# Patient Record
Sex: Female | Born: 1960
Health system: Southern US, Community
[De-identification: ages and names within clinical notes are randomized; demographics above are authoritative.]

## PROBLEM LIST (undated history)

## (undated) DIAGNOSIS — M329 Systemic lupus erythematosus, unspecified: Secondary | ICD-10-CM

## (undated) DIAGNOSIS — E785 Hyperlipidemia, unspecified: Secondary | ICD-10-CM

## (undated) DIAGNOSIS — G4730 Sleep apnea, unspecified: Secondary | ICD-10-CM

## (undated) DIAGNOSIS — L940 Localized scleroderma [morphea]: Secondary | ICD-10-CM

## (undated) DIAGNOSIS — G43909 Migraine, unspecified, not intractable, without status migrainosus: Secondary | ICD-10-CM

## (undated) DIAGNOSIS — M7710 Lateral epicondylitis, unspecified elbow: Secondary | ICD-10-CM

## (undated) DIAGNOSIS — R32 Unspecified urinary incontinence: Secondary | ICD-10-CM

## (undated) DIAGNOSIS — L930 Discoid lupus erythematosus: Secondary | ICD-10-CM

## (undated) DIAGNOSIS — F419 Anxiety disorder, unspecified: Secondary | ICD-10-CM

## (undated) DIAGNOSIS — L564 Polymorphous light eruption: Secondary | ICD-10-CM

## (undated) DIAGNOSIS — M797 Fibromyalgia: Secondary | ICD-10-CM

## (undated) DIAGNOSIS — F329 Major depressive disorder, single episode, unspecified: Secondary | ICD-10-CM

## (undated) DIAGNOSIS — M7711 Lateral epicondylitis, right elbow: Secondary | ICD-10-CM

## (undated) DIAGNOSIS — K8020 Calculus of gallbladder without cholecystitis without obstruction: Secondary | ICD-10-CM

## (undated) DIAGNOSIS — M722 Plantar fascial fibromatosis: Secondary | ICD-10-CM

## (undated) DIAGNOSIS — D219 Benign neoplasm of connective and other soft tissue, unspecified: Secondary | ICD-10-CM

## (undated) DIAGNOSIS — K2950 Unspecified chronic gastritis without bleeding: Secondary | ICD-10-CM

## (undated) DIAGNOSIS — E039 Hypothyroidism, unspecified: Secondary | ICD-10-CM

## (undated) DIAGNOSIS — M1990 Unspecified osteoarthritis, unspecified site: Secondary | ICD-10-CM

## (undated) DIAGNOSIS — F32A Depression, unspecified: Secondary | ICD-10-CM

## (undated) DIAGNOSIS — R7303 Prediabetes: Secondary | ICD-10-CM

## (undated) DIAGNOSIS — L309 Dermatitis, unspecified: Secondary | ICD-10-CM

## (undated) DIAGNOSIS — Z87442 Personal history of urinary calculi: Secondary | ICD-10-CM

## (undated) DIAGNOSIS — E063 Autoimmune thyroiditis: Secondary | ICD-10-CM

## (undated) DIAGNOSIS — N200 Calculus of kidney: Secondary | ICD-10-CM

## (undated) HISTORY — DX: Dermatitis, unspecified: L30.9

## (undated) HISTORY — DX: Benign neoplasm of connective and other soft tissue, unspecified: D21.9

## (undated) HISTORY — PX: ROTATOR CUFF REPAIR: SHX139

## (undated) HISTORY — PX: GALLBLADDER SURGERY: SHX652

## (undated) HISTORY — PX: APPENDECTOMY: SHX54

## (undated) HISTORY — DX: Depression, unspecified: F32.A

## (undated) HISTORY — PX: HIATAL HERNIA REPAIR: SHX195

## (undated) HISTORY — PX: ABDOMINAL HYSTERECTOMY: SHX81

## (undated) HISTORY — DX: Unspecified chronic gastritis without bleeding: K29.50

## (undated) HISTORY — DX: Hyperlipidemia, unspecified: E78.5

## (undated) HISTORY — DX: Hypothyroidism, unspecified: E03.9

## (undated) HISTORY — DX: Discoid lupus erythematosus: L93.0

## (undated) HISTORY — DX: Plantar fascial fibromatosis: M72.2

## (undated) HISTORY — DX: Lateral epicondylitis, unspecified elbow: M77.10

## (undated) HISTORY — PX: ESOPHAGOGASTRODUODENOSCOPY: SHX1529

## (undated) HISTORY — DX: Polymorphous light eruption: L56.4

## (undated) HISTORY — DX: Unspecified osteoarthritis, unspecified site: M19.90

## (undated) HISTORY — DX: Unspecified urinary incontinence: R32

## (undated) HISTORY — PX: OTHER SURGICAL HISTORY: SHX169

## (undated) HISTORY — DX: Major depressive disorder, single episode, unspecified: F32.9

## (undated) HISTORY — DX: Localized scleroderma (morphea): L94.0

## (undated) HISTORY — DX: Calculus of kidney: N20.0

## (undated) HISTORY — DX: Sleep apnea, unspecified: G47.30

## (undated) HISTORY — PX: LAPAROSCOPIC GASTRIC SLEEVE RESECTION: SHX5895

## (undated) HISTORY — DX: Calculus of gallbladder without cholecystitis without obstruction: K80.20

## (undated) HISTORY — DX: Fibromyalgia: M79.7

## (undated) HISTORY — DX: Autoimmune thyroiditis: E06.3

## (undated) HISTORY — DX: Systemic lupus erythematosus, unspecified: M32.9

## (undated) HISTORY — DX: Anxiety disorder, unspecified: F41.9

## (undated) HISTORY — DX: Prediabetes: R73.03

---

## 2002-02-03 HISTORY — PX: LAPAROSCOPIC TOTAL HYSTERECTOMY: SUR800

## 2014-07-05 LAB — HM COLONOSCOPY

## 2015-08-20 DIAGNOSIS — L930 Discoid lupus erythematosus: Secondary | ICD-10-CM | POA: Diagnosis not present

## 2015-08-20 DIAGNOSIS — L940 Localized scleroderma [morphea]: Secondary | ICD-10-CM | POA: Diagnosis not present

## 2015-08-27 DIAGNOSIS — M7541 Impingement syndrome of right shoulder: Secondary | ICD-10-CM | POA: Diagnosis not present

## 2015-08-27 DIAGNOSIS — M25511 Pain in right shoulder: Secondary | ICD-10-CM | POA: Diagnosis not present

## 2015-08-27 DIAGNOSIS — G8929 Other chronic pain: Secondary | ICD-10-CM | POA: Diagnosis not present

## 2015-08-27 DIAGNOSIS — M7531 Calcific tendinitis of right shoulder: Secondary | ICD-10-CM | POA: Diagnosis not present

## 2015-09-04 DIAGNOSIS — R21 Rash and other nonspecific skin eruption: Secondary | ICD-10-CM | POA: Diagnosis not present

## 2015-09-04 DIAGNOSIS — Z79899 Other long term (current) drug therapy: Secondary | ICD-10-CM | POA: Diagnosis not present

## 2015-09-04 DIAGNOSIS — L930 Discoid lupus erythematosus: Secondary | ICD-10-CM | POA: Diagnosis not present

## 2015-09-05 DIAGNOSIS — M25511 Pain in right shoulder: Secondary | ICD-10-CM | POA: Diagnosis not present

## 2015-09-28 DIAGNOSIS — M6281 Muscle weakness (generalized): Secondary | ICD-10-CM | POA: Diagnosis not present

## 2015-10-12 DIAGNOSIS — Z5189 Encounter for other specified aftercare: Secondary | ICD-10-CM | POA: Diagnosis not present

## 2015-10-12 DIAGNOSIS — M7521 Bicipital tendinitis, right shoulder: Secondary | ICD-10-CM | POA: Diagnosis not present

## 2015-10-23 DIAGNOSIS — M7521 Bicipital tendinitis, right shoulder: Secondary | ICD-10-CM | POA: Diagnosis not present

## 2015-11-01 DIAGNOSIS — M6281 Muscle weakness (generalized): Secondary | ICD-10-CM | POA: Diagnosis not present

## 2015-11-02 DIAGNOSIS — M25511 Pain in right shoulder: Secondary | ICD-10-CM | POA: Diagnosis not present

## 2015-11-07 DIAGNOSIS — E039 Hypothyroidism, unspecified: Secondary | ICD-10-CM | POA: Diagnosis not present

## 2015-11-07 DIAGNOSIS — E785 Hyperlipidemia, unspecified: Secondary | ICD-10-CM | POA: Diagnosis not present

## 2015-11-22 DIAGNOSIS — Z23 Encounter for immunization: Secondary | ICD-10-CM | POA: Diagnosis not present

## 2015-11-26 DIAGNOSIS — L930 Discoid lupus erythematosus: Secondary | ICD-10-CM | POA: Diagnosis not present

## 2015-11-26 DIAGNOSIS — L940 Localized scleroderma [morphea]: Secondary | ICD-10-CM | POA: Diagnosis not present

## 2015-11-26 DIAGNOSIS — L2084 Intrinsic (allergic) eczema: Secondary | ICD-10-CM | POA: Diagnosis not present

## 2015-11-26 DIAGNOSIS — L309 Dermatitis, unspecified: Secondary | ICD-10-CM | POA: Diagnosis not present

## 2015-12-16 ENCOUNTER — Emergency Department
Admission: EM | Admit: 2015-12-16 | Discharge: 2015-12-17 | Disposition: A | Discharge status: Home / Self Care | Payer: BLUE CROSS/BLUE SHIELD | Attending: Emergency Medicine | Admitting: Emergency Medicine

## 2015-12-16 ENCOUNTER — Emergency Department: Payer: BLUE CROSS/BLUE SHIELD

## 2015-12-16 DIAGNOSIS — Z87891 Personal history of nicotine dependence: Secondary | ICD-10-CM | POA: Insufficient documentation

## 2015-12-16 DIAGNOSIS — Z79899 Other long term (current) drug therapy: Secondary | ICD-10-CM | POA: Insufficient documentation

## 2015-12-16 DIAGNOSIS — N201 Calculus of ureter: Secondary | ICD-10-CM | POA: Diagnosis not present

## 2015-12-16 DIAGNOSIS — N200 Calculus of kidney: Secondary | ICD-10-CM

## 2015-12-16 DIAGNOSIS — N132 Hydronephrosis with renal and ureteral calculous obstruction: Secondary | ICD-10-CM | POA: Diagnosis not present

## 2015-12-16 DIAGNOSIS — R109 Unspecified abdominal pain: Secondary | ICD-10-CM | POA: Diagnosis present

## 2015-12-16 DIAGNOSIS — E039 Hypothyroidism, unspecified: Secondary | ICD-10-CM | POA: Diagnosis not present

## 2015-12-16 LAB — URINALYSIS COMPLETE WITH MICROSCOPIC (ARMC ONLY)
Bilirubin Urine: NEGATIVE
Glucose, UA: NEGATIVE mg/dL
Ketones, ur: NEGATIVE mg/dL
Leukocytes, UA: NEGATIVE
Nitrite: NEGATIVE
Protein, ur: 100 mg/dL — AB
Specific Gravity, Urine: 1.028 (ref 1.005–1.030)
pH: 5 (ref 5.0–8.0)

## 2015-12-16 LAB — COMPREHENSIVE METABOLIC PANEL
ALT: 22 U/L (ref 14–54)
AST: 23 U/L (ref 15–41)
Albumin: 4 g/dL (ref 3.5–5.0)
Alkaline Phosphatase: 96 U/L (ref 38–126)
Anion gap: 7 (ref 5–15)
BUN: 22 mg/dL — ABNORMAL HIGH (ref 6–20)
CO2: 27 mmol/L (ref 22–32)
Calcium: 9 mg/dL (ref 8.9–10.3)
Chloride: 105 mmol/L (ref 101–111)
Creatinine, Ser: 1.05 mg/dL — ABNORMAL HIGH (ref 0.44–1.00)
GFR calc Af Amer: >60 mL/min (ref >60)
GFR calc non Af Amer: 59 mL/min — ABNORMAL LOW (ref >60)
Glucose, Bld: 144 mg/dL — ABNORMAL HIGH (ref 65–99)
Potassium: 3.9 mmol/L (ref 3.5–5.1)
Sodium: 139 mmol/L (ref 135–145)
Total Bilirubin: 0.7 mg/dL (ref 0.3–1.2)
Total Protein: 8 g/dL (ref 6.5–8.1)

## 2015-12-16 LAB — CBC
HCT: 44.2 % (ref 35.0–47.0)
Hemoglobin: 14.5 g/dL (ref 12.0–16.0)
MCH: 29.2 pg (ref 26.0–34.0)
MCHC: 32.8 g/dL (ref 32.0–36.0)
MCV: 88.9 fL (ref 80.0–100.0)
Platelets: 246 10*3/uL (ref 150–440)
RBC: 4.98 MIL/uL (ref 3.80–5.20)
RDW: 14.2 % (ref 11.5–14.5)
WBC: 15.7 10*3/uL — ABNORMAL HIGH (ref 3.6–11.0)

## 2015-12-16 MED ORDER — ONDANSETRON HCL 4 MG/2ML IJ SOLN
INTRAMUSCULAR | Status: AC
  Administered 2015-12-16: 4 mg via INTRAVENOUS
  Filled 2015-12-16: qty 2

## 2015-12-16 MED ORDER — ONDANSETRON HCL 4 MG/2ML IJ SOLN
4.0000 mg | Freq: Once | INTRAMUSCULAR | Status: AC
  Administered 2015-12-16: 4 mg via INTRAVENOUS

## 2015-12-16 MED ORDER — SODIUM CHLORIDE 0.9 % IV BOLUS (SEPSIS)
1000.0000 mL | Freq: Once | INTRAVENOUS | Status: AC
  Administered 2015-12-16: 1000 mL via INTRAVENOUS

## 2015-12-16 MED ORDER — MORPHINE SULFATE (PF) 4 MG/ML IV SOLN
4.0000 mg | Freq: Once | INTRAVENOUS | Status: AC
  Administered 2015-12-16: 4 mg via INTRAVENOUS
  Filled 2015-12-16: qty 1

## 2015-12-16 MED ORDER — KETOROLAC TROMETHAMINE 30 MG/ML IJ SOLN
30.0000 mg | Freq: Once | INTRAMUSCULAR | Status: AC
  Administered 2015-12-16: 30 mg via INTRAVENOUS
  Filled 2015-12-16: qty 1

## 2015-12-16 MED ORDER — TAMSULOSIN HCL 0.4 MG PO CAPS
0.4000 mg | ORAL_CAPSULE | Freq: Once | ORAL | Status: AC
  Administered 2015-12-16: 0.4 mg via ORAL
  Filled 2015-12-16: qty 1

## 2015-12-16 NOTE — ED Triage Notes (Signed)
Pt reports suddely while driving home from [facility] onset of left flank pain around 3 pm today while driving home from [place].Pt reports pain goes straight through to her back.

## 2015-12-16 NOTE — ED Provider Notes (Signed)
[facility] Emergency Department Provider Note  ____________________________________________  Time seen: Approximately 8:33 PM  I have reviewed the triage vital signs and the nursing notes.   HISTORY  Chief Complaint Flank Pain   HPI Helen Freeman is a 55 y.o. female with h/o Lupus who presents for evaluation of the left flank pain. Patient reports that she was driving home from [place] when she developed sudden onset of left flank pain. The pain is 8 out of 10, sharp, located in her left flank, radiating to her left lower quadrant, associated with nausea and 1 episode of nonbloody nonbilious emesis. She denies fever, chills, chest pain, shortness of breath, hematuria, dysuria, frequency, prior history of kidney stones. Patient is status post total salpingo-oophorectomy and hysterectomy a decade ago c/b adhesions. She denies vaginal discharge. She is also s/p appendectomy.  Past Medical History:  Diagnosis Date  . Anxiety   . Arthritis   . Depression   . Hashimoto's disease   . Hyperlipemia   . Hypothyroidism   . Lupus   . Morphea   . Sleep apnea     There are no active problems to display for this patient.   Past Surgical History:  Procedure Laterality Date  . LAPAROSCOPIC TOTAL HYSTERECTOMY  2004  . OTHER SURGICAL HISTORY    . ROTATOR CUFF REPAIR Right     Prior to Admission medications   Medication Sig Start Date End Date Taking? Authorizing Provider  atorvastatin (LIPITOR) 20 MG tablet Take 20 mg by mouth daily.   Yes Historical Provider, MD  hydrOXYzine (ATARAX/VISTARIL) 10 MG tablet Take 20 mg by mouth 4 (four) times daily as needed.   Yes Historical Provider, MD  levothyroxine (SYNTHROID, LEVOTHROID) 100 MCG tablet Take 200 mcg by mouth daily before breakfast.   Yes Historical Provider, MD  mycophenolate (CELLCEPT) 500 MG tablet Take 1,000 mg by mouth daily.   Yes Historical Provider, MD  mycophenolate (CELLCEPT) 500 MG tablet Take  500 mg by mouth at bedtime.   Yes Historical Provider, MD  ondansetron (ZOFRAN ODT) 4 MG disintegrating tablet Take 1 tablet (4 mg total) by mouth every 8 (eight) hours as needed for nausea or vomiting. Patient not taking: Reported on 12/19/2015 12/17/15   Loney Hering, MD  oxyCODONE-acetaminophen (ROXICET) 5-325 MG tablet Take 1 tablet by mouth every 6 (six) hours as needed. 12/17/15   Loney Hering, MD  oxyCODONE-acetaminophen (ROXICET) 5-325 MG tablet Take 1 tablet by mouth every 4 (four) hours as needed for severe pain. 12/19/15   Nickie Retort, MD  tamsulosin (FLOMAX) 0.4 MG CAPS capsule Take 1 capsule (0.4 mg total) by mouth daily. 12/17/15   Loney Hering, MD    Allergies Plaquenil [hydroxychloroquine]  Family History  Problem Relation Age of Onset  . Kidney cancer Maternal Grandmother   . Prostate cancer Neg Hx     Social History Social History  Substance Use Topics  . Smoking status: Former Research scientist (life sciences)  . Smokeless tobacco: Never Used  . Alcohol use Yes    Review of Systems  Constitutional: Negative for fever. Eyes: Negative for visual changes. ENT: Negative for sore throat. Cardiovascular: Negative for chest pain. Respiratory: Negative for shortness of breath. Gastrointestinal: Negative for abdominal pain, vomiting or diarrhea. Genitourinary: Negative for dysuria. + L flank pain Musculoskeletal: Negative for back pain. Skin: Negative for rash. Neurological: Negative for headaches, weakness or numbness.  ____________________________________________   PHYSICAL EXAM:  VITAL SIGNS: ED Triage Vitals  Enc  Vitals Group     BP 12/16/15 2019 (!) 138/95     Pulse Rate 12/16/15 2019 78     Resp 12/16/15 2019 (!) 22     Temp 12/16/15 2019 97.7 F (36.5 C)     Temp Source 12/16/15 2019 Oral     SpO2 12/16/15 2019 98 %     Weight 12/16/15 2020 214 lb (97.1 kg)     Height 12/16/15 2020 5\' 2"  (1.575 m)     Head Circumference --      Peak Flow --       Pain Score 12/16/15 2022 9     Pain Loc --      Pain Edu? --      Excl. in [place]? --     Constitutional: Alert and oriented, in obvious distress due to pain.  HEENT:      Head: Normocephalic and atraumatic.         Eyes: Conjunctivae are normal. Sclera is non-icteric. EOMI. PERRL      Mouth/Throat: Mucous membranes are moist.       Neck: Supple with no signs of meningismus. Cardiovascular: Regular rate and rhythm. No murmurs, gallops, or rubs. 2+ symmetrical distal pulses are present in all extremities. No JVD. Respiratory: Normal respiratory effort. Lungs are clear to auscultation bilaterally. No wheezes, crackles, or rhonchi.  Gastrointestinal: Soft, LLQ ttp, non distended with positive bowel sounds. No rebound or guarding. Genitourinary: No CVA tenderness. Musculoskeletal: Nontender with normal range of motion in all extremities. No edema, cyanosis, or erythema of extremities. Neurologic: Normal speech and language. Face is symmetric. Moving all extremities. No gross focal neurologic deficits are appreciated. Skin: Skin is warm, dry and intact. No rash noted. Psychiatric: Mood and affect are normal. Speech and behavior are normal.  ____________________________________________   LABS (all labs ordered are listed, but only abnormal results are displayed)  Labs Reviewed  CBC - Abnormal; Notable for the following:       Result Value   WBC 15.7 (*)    All other components within normal limits  COMPREHENSIVE METABOLIC PANEL - Abnormal; Notable for the following:    Glucose, Bld 144 (*)    BUN 22 (*)    Creatinine, Ser 1.05 (*)    GFR calc non Af Amer 59 (*)    All other components within normal limits  URINALYSIS COMPLETEWITH MICROSCOPIC ([facility] ONLY) - Abnormal; Notable for the following:    Color, Urine AMBER (*)    APPearance CLOUDY (*)    Hgb urine dipstick 3+ (*)    Protein, ur 100 (*)    Bacteria, UA RARE (*)    Squamous Epithelial / LPF 0-5 (*)    All other components  within normal limits  URINE CULTURE   ____________________________________________  EKG  none ____________________________________________  RADIOLOGY  CT renal: Obstructing calculus at the LEFT ureteropelvic junction. ____________________________________________   PROCEDURES  Procedure(s) performed: None Procedures Critical Care performed:  None ____________________________________________   INITIAL IMPRESSION / ASSESSMENT AND PLAN / ED COURSE  55 y.o. female with h/o lupus who presents for evaluation of sudden onset sharp left flank pain radiating to her groin. Patient significant distress, has normal vital signs, mild left lower quadrant tenderness to palpation, no flank tenderness. Patient is status post total hysterectomy with bilateral salpingo-oophorectomy one decade ago. Presentation concerning for kidney stone. We'll give IV fluids, IV Toradol, IV morphine, IV Zofran for symptom control. We'll get CT renal. We'll check basic labs and urinalysis.  Clinical  Course as of Dec 18 2041  Nancy Fetter Dec 16, 2015  2320 UA pending. Care transferred to Dr. Dahlia Client  [CV]    Clinical Course User Index [CV] Rudene Re, MD   Ct showing 59mm obstructing stone at left UPJ. UA pending. Creatinine at baseline.  Pertinent labs & imaging results that were available during my care of the patient were reviewed by me and considered in my medical decision making (see chart for details).    ____________________________________________   FINAL CLINICAL IMPRESSION(S) / ED DIAGNOSES  Final diagnoses:  Kidney stone      NEW MEDICATIONS STARTED DURING THIS VISIT:  Discharge Medication List as of 12/17/2015 12:14 AM    START taking these medications   Details  oxyCODONE-acetaminophen (ROXICET) 5-325 MG tablet Take 1 tablet by mouth every 6 (six) hours as needed., Starting Mon 12/17/2015, Print    tamsulosin (FLOMAX) 0.4 MG CAPS capsule Take 1 capsule (0.4 mg total) by mouth daily.,  Starting Mon 12/17/2015, Print    ondansetron (ZOFRAN ODT) 4 MG disintegrating tablet Take 1 tablet (4 mg total) by mouth every 8 (eight) hours as needed for nausea or vomiting., Starting Mon 12/17/2015, Print         Note:  This document was prepared using Dragon voice recognition software and may include unintentional dictation errors.    Rudene Re, MD 12/19/15 2043

## 2015-12-17 MED ORDER — TAMSULOSIN HCL 0.4 MG PO CAPS: 0.4000 mg | ORAL_CAPSULE | Freq: Every day | ORAL | 0 refills | Status: DC

## 2015-12-17 MED ORDER — OXYCODONE-ACETAMINOPHEN 5-325 MG PO TABS
1.0000 | ORAL_TABLET | Freq: Once | ORAL | Status: AC
  Administered 2015-12-17: 1 via ORAL
  Filled 2015-12-17: qty 1

## 2015-12-17 MED ORDER — ONDANSETRON 4 MG PO TBDP
4.0000 mg | ORAL_TABLET | Freq: Three times a day (TID) | ORAL | 0 refills | Status: DC | PRN
Start: 2015-12-17 — End: 2017-11-19

## 2015-12-17 MED ORDER — OXYCODONE-ACETAMINOPHEN 5-325 MG PO TABS: 1.0000 | ORAL_TABLET | Freq: Four times a day (QID) | ORAL | 0 refills | Status: DC | PRN

## 2015-12-17 NOTE — ED Provider Notes (Signed)
-----------------------------------------   12:16 AM on 12/17/2015 -----------------------------------------   Blood pressure 111/71, pulse 83, temperature 97.7 F (36.5 C), temperature source Oral, resp. rate (!) 22, height 5\' 2"  (1.575 m), weight 214 lb (97.1 kg), SpO2 94 %.  Assuming care from Dr. Kelli Hope.  In short, Helen Freeman is a 55 y.o. female with a chief complaint of Flank Pain .  Refer to the original H&P for additional details.  The current plan of care is to follow up the results of the urinalysis.  The patient's urinalysis does not show any signs of infection. She has 6-30 white blood cells and rare bacteria. The patient's pain is improved at this time. I will give her dose of Percocet and I will discharge the patient to home. The patient should follow-up with urology should she continue to have pain. The patient otherwise will be discharged home. She has no further questions at this time.    Loney Hering, MD 12/17/15 [phone]

## 2015-12-18 LAB — URINE CULTURE: Culture: NO GROWTH

## 2015-12-19 ENCOUNTER — Other Ambulatory Visit: Payer: Self-pay | Admitting: Radiology

## 2015-12-19 ENCOUNTER — Ambulatory Visit
Admission: RE | Admit: 2015-12-19 | Discharge: 2015-12-19 | Disposition: A | Discharge status: Home / Self Care | Payer: BLUE CROSS/BLUE SHIELD | Source: Ambulatory Visit | Attending: Urology | Admitting: Urology

## 2015-12-19 ENCOUNTER — Ambulatory Visit (INDEPENDENT_AMBULATORY_CARE_PROVIDER_SITE_OTHER): Payer: BLUE CROSS/BLUE SHIELD | Admitting: Urology

## 2015-12-19 ENCOUNTER — Encounter: Payer: Self-pay | Admitting: Urology

## 2015-12-19 VITALS — BP 139/84 | HR 90 | Ht 62.0 in | Wt 219.0 lb

## 2015-12-19 DIAGNOSIS — N201 Calculus of ureter: Secondary | ICD-10-CM | POA: Diagnosis not present

## 2015-12-19 DIAGNOSIS — R109 Unspecified abdominal pain: Secondary | ICD-10-CM | POA: Diagnosis not present

## 2015-12-19 DIAGNOSIS — N200 Calculus of kidney: Secondary | ICD-10-CM

## 2015-12-19 MED ORDER — CIPROFLOXACIN HCL 500 MG PO TABS
500.0000 mg | ORAL_TABLET | ORAL | Status: AC
  Administered 2015-12-20: 500 mg via ORAL

## 2015-12-19 MED ORDER — OXYCODONE-ACETAMINOPHEN 5-325 MG PO TABS: 1.0000 | ORAL_TABLET | ORAL | 0 refills | Status: DC | PRN

## 2015-12-19 NOTE — Progress Notes (Signed)
12/19/2015 12:34 PM   Helen Freeman Jan 13, 1961 [id number]  Referring provider: [name], MD [address] [place], [place] [address]  Chief Complaint  Patient presents with  . Nephrolithiasis    New Patient   2 HPI: The patient presents for follow-up after being seen in the ER with a left proximal ureteral stone. The the stone is 4 mm in width and 6 mm in length.  This is her first stone. She has is still having left flank pain. No current Nausea or vomiting. This is her first stone. She has never had a stone prior to this. She has no other urinary complaints at this time.  KUB today shows a stone in the same position the left proximal ureter.   PMH: Past Medical History:  Diagnosis Date  . Anxiety   . Arthritis   . Depression   . Hashimoto's disease   . Hyperlipemia   . Hypothyroidism   . Lupus   . Morphea   . Sleep apnea     Surgical History: Past Surgical History:  Procedure Laterality Date  . LAPAROSCOPIC TOTAL HYSTERECTOMY  2004  . OTHER SURGICAL HISTORY    . ROTATOR CUFF REPAIR Right     Home Medications:    Medication List       Accurate as of 12/19/15 12:34 PM. Always use your most recent med list.          atorvastatin 20 MG tablet Commonly known as:  LIPITOR Take 20 mg by mouth daily.   hydrOXYzine 10 MG tablet Commonly known as:  ATARAX/VISTARIL Take 20 mg by mouth 4 (four) times daily as needed.   levothyroxine 100 MCG tablet Commonly known as:  SYNTHROID, LEVOTHROID Take 200 mcg by mouth daily before breakfast.   mycophenolate 500 MG tablet Commonly known as:  CELLCEPT Take 1,000 mg by mouth daily.   mycophenolate 500 MG tablet Commonly known as:  CELLCEPT Take 500 mg by mouth at bedtime.   ondansetron 4 MG disintegrating tablet Commonly known as:  ZOFRAN ODT Take 1 tablet (4 mg total) by mouth every 8 (eight) hours as needed for nausea or vomiting.   oxyCODONE-acetaminophen 5-325 MG tablet Commonly known as:   ROXICET Take 1 tablet by mouth every 6 (six) hours as needed.   oxyCODONE-acetaminophen 5-325 MG tablet Commonly known as:  ROXICET Take 1 tablet by mouth every 4 (four) hours as needed for severe pain.   tamsulosin 0.4 MG Caps capsule Commonly known as:  FLOMAX Take 1 capsule (0.4 mg total) by mouth daily.       Allergies:  Allergies  Allergen Reactions  . Plaquenil [Hydroxychloroquine] Rash    Family History: Family History  Problem Relation Age of Onset  . Kidney cancer Maternal Grandmother   . Prostate cancer Neg Hx     Social History:  reports that she has quit smoking. She has never used smokeless tobacco. She reports that she drinks alcohol. She reports that she does not use drugs.  ROS: UROLOGY Frequent Urination?: No Hard to postpone urination?: No Burning/pain with urination?: No Get up at night to urinate?: No Leakage of urine?: Yes Urine stream starts and stops?: No Trouble starting stream?: No Do you have to strain to urinate?: No Blood in urine?: No Urinary tract infection?: No Sexually transmitted disease?: No Injury to kidneys or bladder?: No Painful intercourse?: No Weak stream?: No Currently pregnant?: No Vaginal bleeding?: No Last menstrual period?: hysterectomy  Gastrointestinal Nausea?: No Vomiting?: Yes Indigestion/heartburn?: No Diarrhea?:  No Constipation?: No  Constitutional Fever: No Night sweats?: No Weight loss?: No Fatigue?: Yes  Skin Skin rash/lesions?: Yes Itching?: Yes  Eyes Blurred vision?: No Double vision?: No  Ears/Nose/Throat Sore throat?: No Sinus problems?: No  Hematologic/Lymphatic Swollen glands?: No Easy bruising?: No  Cardiovascular Leg swelling?: Yes Chest pain?: No  Respiratory Cough?: Yes Shortness of breath?: No  Endocrine Excessive thirst?: No  Musculoskeletal Back pain?: Yes Joint pain?: Yes  Neurological Headaches?: Yes Dizziness?: No  Psychologic Depression?:  Yes Anxiety?: Yes  Physical Exam: BP 139/84   Pulse 90   Ht 5\' 2"  (1.575 m)   Wt 219 lb (99.3 kg)   BMI 40.06 kg/m   Constitutional:  Alert and oriented, No acute distress. HEENT: [place] AT, moist mucus membranes.  Trachea midline, no masses. Cardiovascular: No clubbing, cyanosis, or edema. Respiratory: Normal respiratory effort, no increased work of breathing. GI: Abdomen is soft, nontender, nondistended, no abdominal masses GU: No CVA tenderness.  Skin: No rashes, bruises or suspicious lesions. Lymph: No cervical or inguinal adenopathy. Neurologic: Grossly intact, no focal deficits, moving all 4 extremities. Psychiatric: Normal mood and affect.  Laboratory Data: Lab Results  Component Value Date   WBC 15.7 (H) 12/16/2015   HGB 14.5 12/16/2015   HCT 44.2 12/16/2015   MCV 88.9 12/16/2015   PLT 246 12/16/2015    Lab Results  Component Value Date   CREATININE 1.05 (H) 12/16/2015    No results found for: PSA  No results found for: TESTOSTERONE  No results found for: HGBA1C  Urinalysis    Component Value Date/Time   COLORURINE AMBER (A) 12/16/2015 2320   APPEARANCEUR CLOUDY (A) 12/16/2015 2320   LABSPEC 1.028 12/16/2015 2320   PHURINE 5.0 12/16/2015 2320   GLUCOSEU NEGATIVE 12/16/2015 2320   HGBUR 3+ (A) 12/16/2015 2320   BILIRUBINUR NEGATIVE 12/16/2015 2320   KETONESUR NEGATIVE 12/16/2015 2320   PROTEINUR 100 (A) 12/16/2015 2320   NITRITE NEGATIVE 12/16/2015 2320   LEUKOCYTESUR NEGATIVE 12/16/2015 2320    Pertinent Imaging: CLINICAL DATA:  Acute LEFT flank pain. History of appendectomy and hysterectomy  EXAM: CT ABDOMEN AND PELVIS WITHOUT CONTRAST  TECHNIQUE: Multidetector CT imaging of the abdomen and pelvis was performed following the standard protocol without IV contrast.  COMPARISON:  None.  FINDINGS: Lower chest: Lung bases are clear. Simple fluid density structure extending inferior from the LEFT inferior pulmonary veins is felt represent a  prominent pericardial recess or other benign bronchogenic cyst (image number 6, series 2  Hepatobiliary: No focal hepatic lesion. No biliary duct dilatation. Gallbladder is normal. Common bile duct is normal.  Pancreas: Pancreas is normal. No ductal dilatation. No pancreatic inflammation.  Spleen: Normal spleen  Adrenals/urinary tract: Adrenal glands normal. There is renal edema on the LEFT. There is mild hydronephrosis. This is secondary to obstructing calculus at the LEFT ureteropelvic junction measuring 4 mm (image 40, series 2). This calculus measures 6 mm in craniocaudad dimension. No more distal ureteral calculi. No bladder calculi. No RIGHT nephrolithiasis or ureterolithiasis.  Stomach/Bowel: Stomach, small bowel, and cecum are normal. The colon and rectosigmoid colon are normal.  Vascular/Lymphatic: Abdominal aorta is normal caliber with atherosclerotic calcification. There is no retroperitoneal or periportal lymphadenopathy. No pelvic lymphadenopathy.  Reproductive: Post hysterectomy  Other: No free fluid.  Musculoskeletal: No aggressive osseous lesion.  IMPRESSION: Obstructing calculus at the LEFT ureteropelvic junction.  KUB shows stone in proximal left ureter  Assessment & Plan:    1. Proximal left ureteral stone I discussed options  for treatment of her left proximal ureteral stone which include medical expulsive therapy, lithotripsy, and ureteroscopy. We discussed her risks and benefits of each treatment modality. Since the patient is very uncomfortable at this time, she would like to undergo a procedure. She understands the risks include but are not limited to bleeding, infection, need for repeat procedures, incomplete stone passage for lithotripsy. She has elected to undergo left lithotripsy of a proximal calculus. We will get her scheduled for this tomorrow.   Nickie Retort, MD  [facility] [address] [place], [place] [address] [phone]

## 2015-12-20 ENCOUNTER — Ambulatory Visit
Admission: RE | Admit: 2015-12-20 | Discharge: 2015-12-20 | Disposition: A | Discharge status: Home / Self Care | Payer: BLUE CROSS/BLUE SHIELD | Source: Ambulatory Visit | Attending: Urology | Admitting: Urology

## 2015-12-20 ENCOUNTER — Encounter
Admission: RE | Disposition: A | Discharge status: Home / Self Care | Payer: Self-pay | Source: Ambulatory Visit | Attending: Urology

## 2015-12-20 ENCOUNTER — Encounter: Payer: Self-pay | Admitting: *Deleted

## 2015-12-20 DIAGNOSIS — E039 Hypothyroidism, unspecified: Secondary | ICD-10-CM | POA: Diagnosis not present

## 2015-12-20 DIAGNOSIS — E669 Obesity, unspecified: Secondary | ICD-10-CM | POA: Insufficient documentation

## 2015-12-20 DIAGNOSIS — Z79899 Other long term (current) drug therapy: Secondary | ICD-10-CM | POA: Insufficient documentation

## 2015-12-20 DIAGNOSIS — Z6841 Body Mass Index (BMI) 40.0 and over, adult: Secondary | ICD-10-CM | POA: Insufficient documentation

## 2015-12-20 DIAGNOSIS — E785 Hyperlipidemia, unspecified: Secondary | ICD-10-CM | POA: Insufficient documentation

## 2015-12-20 DIAGNOSIS — Z87891 Personal history of nicotine dependence: Secondary | ICD-10-CM | POA: Insufficient documentation

## 2015-12-20 DIAGNOSIS — N200 Calculus of kidney: Secondary | ICD-10-CM | POA: Diagnosis not present

## 2015-12-20 DIAGNOSIS — N201 Calculus of ureter: Secondary | ICD-10-CM | POA: Insufficient documentation

## 2015-12-20 DIAGNOSIS — G4730 Sleep apnea, unspecified: Secondary | ICD-10-CM | POA: Insufficient documentation

## 2015-12-20 DIAGNOSIS — M329 Systemic lupus erythematosus, unspecified: Secondary | ICD-10-CM | POA: Insufficient documentation

## 2015-12-20 HISTORY — PX: EXTRACORPOREAL SHOCK WAVE LITHOTRIPSY: SHX1557

## 2015-12-20 SURGERY — LITHOTRIPSY, ESWL
Anesthesia: Moderate Sedation | Laterality: Left

## 2015-12-20 MED ORDER — CIPROFLOXACIN HCL 500 MG PO TABS
ORAL_TABLET | ORAL | Status: AC
  Administered 2015-12-20: 500 mg via ORAL
  Filled 2015-12-20: qty 1

## 2015-12-20 MED ORDER — DEXTROSE-NACL 5-0.45 % IV SOLN
INTRAVENOUS | Status: DC
  Administered 2015-12-20: 11:00:00 via INTRAVENOUS

## 2015-12-20 MED ORDER — ONDANSETRON HCL 4 MG/2ML IJ SOLN
4.0000 mg | Freq: Once | INTRAMUSCULAR | Status: AC
  Administered 2015-12-20: 4 mg via INTRAVENOUS

## 2015-12-20 MED ORDER — DIPHENHYDRAMINE HCL 25 MG PO CAPS
25.0000 mg | ORAL_CAPSULE | ORAL | Status: AC
  Administered 2015-12-20: 25 mg via ORAL

## 2015-12-20 MED ORDER — OXYCODONE-ACETAMINOPHEN 5-325 MG PO TABS: 1.0000 | ORAL_TABLET | Freq: Once | ORAL | Status: DC

## 2015-12-20 MED ORDER — MIDAZOLAM HCL 2 MG/2ML IJ SOLN
1.0000 mg | Freq: Once | INTRAMUSCULAR | Status: AC
  Administered 2015-12-20: 1 mg via INTRAVENOUS

## 2015-12-20 MED ORDER — DIPHENHYDRAMINE HCL 25 MG PO CAPS
ORAL_CAPSULE | ORAL | Status: AC
  Administered 2015-12-20: 25 mg via ORAL
  Filled 2015-12-20: qty 1

## 2015-12-20 MED ORDER — ONDANSETRON HCL 4 MG/2ML IJ SOLN
INTRAMUSCULAR | Status: AC
  Administered 2015-12-20: 4 mg via INTRAVENOUS
  Filled 2015-12-20: qty 2

## 2015-12-20 MED ORDER — OXYCODONE-ACETAMINOPHEN 5-325 MG PO TABS
ORAL_TABLET | ORAL | Status: AC
  Filled 2015-12-20: qty 1

## 2015-12-20 MED ORDER — MIDAZOLAM HCL 2 MG/2ML IJ SOLN
INTRAMUSCULAR | Status: AC
  Administered 2015-12-20: 1 mg via INTRAVENOUS
  Filled 2015-12-20: qty 2

## 2015-12-20 NOTE — Discharge Instructions (Signed)
AMBULATORY SURGERY  °DISCHARGE INSTRUCTIONS ° ° °1) The drugs that you were given will stay in your system until tomorrow so for the next 24 hours you should not: ° °A) Drive an automobile °B) Make any legal decisions °C) Drink any alcoholic beverage ° ° °2) You may resume regular meals tomorrow.  Today it is better to start with liquids and gradually work up to solid foods. ° °You may eat anything you prefer, but it is better to start with liquids, then soup and crackers, and gradually work up to solid foods. ° ° °3) Please notify your doctor immediately if you have any unusual bleeding, trouble breathing, redness and pain at the surgery site, drainage, fever, or pain not relieved by medication. ° ° ° °4) Additional Instructions: ° ° ° ° ° ° ° °Please contact your physician with any problems or Same Day Surgery at [phone], Monday through Friday 6 am to 4 pm, or [place] [facility] [place] Main number at [phone].AMBULATORY SURGERY  °DISCHARGE INSTRUCTIONS ° ° °5) The drugs that you were given will stay in your system until tomorrow so for the next 24 hours you should not: ° °D) Drive an automobile °E) Make any legal decisions °F) Drink any alcoholic beverage ° ° °6) You may resume regular meals tomorrow.  Today it is better to start with liquids and gradually work up to solid foods. ° °You may eat anything you prefer, but it is better to start with liquids, then soup and crackers, and gradually work up to solid foods. ° ° °7) Please notify your doctor immediately if you have any unusual bleeding, trouble breathing, redness and pain at the surgery site, drainage, fever, or pain not relieved by medication. ° ° ° °8) Additional Instructions: ° ° ° ° ° ° ° °Please contact your physician with any problems or Same Day Surgery at [phone], Monday through Friday 6 am to 4 pm, or [place] [facility] [place] Main number at [phone]. °

## 2015-12-21 ENCOUNTER — Encounter: Payer: Self-pay | Admitting: Urology

## 2015-12-24 DIAGNOSIS — M25511 Pain in right shoulder: Secondary | ICD-10-CM | POA: Diagnosis not present

## 2015-12-24 DIAGNOSIS — Z79891 Long term (current) use of opiate analgesic: Secondary | ICD-10-CM | POA: Diagnosis not present

## 2016-01-02 ENCOUNTER — Ambulatory Visit
Admission: RE | Admit: 2016-01-02 | Discharge: 2016-01-02 | Disposition: A | Discharge status: Home / Self Care | Payer: BLUE CROSS/BLUE SHIELD | Source: Ambulatory Visit | Attending: Urology | Admitting: Urology

## 2016-01-02 ENCOUNTER — Ambulatory Visit (INDEPENDENT_AMBULATORY_CARE_PROVIDER_SITE_OTHER): Payer: BLUE CROSS/BLUE SHIELD | Admitting: Urology

## 2016-01-02 ENCOUNTER — Other Ambulatory Visit: Payer: Self-pay | Admitting: Urology

## 2016-01-02 ENCOUNTER — Encounter: Payer: Self-pay | Admitting: Urology

## 2016-01-02 VITALS — BP 142/94 | HR 89 | Ht 62.0 in | Wt 221.0 lb

## 2016-01-02 DIAGNOSIS — N200 Calculus of kidney: Secondary | ICD-10-CM

## 2016-01-02 DIAGNOSIS — Z87442 Personal history of urinary calculi: Secondary | ICD-10-CM | POA: Insufficient documentation

## 2016-01-02 NOTE — Progress Notes (Signed)
01/02/2016 4:02 PM   Helen Freeman 1960/11/07 [id number]  Referring provider: [name], MD [address] [place], [place] [address]  Chief Complaint  Patient presents with  . Routine Post Op    KUB results    HPI: The patient is a 55 year old female presents today for follow-up after undergoing lithotripsy for a 6 mm proximal ureteral stone. This was her first stone.  She brought her stone in for analysis. Her pain has resolved. She has passed quite a few stone fragments. No nausea or vomiting fevers. No postoperative complications.  KUB shows no further stone.   PMH: Past Medical History:  Diagnosis Date  . Anxiety   . Arthritis   . Depression   . Hashimoto's disease   . Hyperlipemia   . Hypothyroidism   . Lupus   . Morphea   . Sleep apnea     Surgical History: Past Surgical History:  Procedure Laterality Date  . EXTRACORPOREAL SHOCK WAVE LITHOTRIPSY Left 12/20/2015   Procedure: EXTRACORPOREAL SHOCK WAVE LITHOTRIPSY (ESWL);  Surgeon: Nickie Retort, MD;  Location: [facility] ORS;  Service: Urology;  Laterality: Left;  . LAPAROSCOPIC TOTAL HYSTERECTOMY  2004  . OTHER SURGICAL HISTORY    . ROTATOR CUFF REPAIR Right     Home Medications:    Medication List       Accurate as of 01/02/16  4:02 PM. Always use your most recent med list.          atorvastatin 20 MG tablet Commonly known as:  LIPITOR Take 20 mg by mouth daily.   hydrOXYzine 10 MG tablet Commonly known as:  ATARAX/VISTARIL Take 20 mg by mouth 4 (four) times daily as needed.   levothyroxine 100 MCG tablet Commonly known as:  SYNTHROID, LEVOTHROID Take 200 mcg by mouth daily before breakfast.   mycophenolate 500 MG tablet Commonly known as:  CELLCEPT Take 1,000 mg by mouth daily.   mycophenolate 500 MG tablet Commonly known as:  CELLCEPT Take 500 mg by mouth at bedtime.   ondansetron 4 MG disintegrating tablet Commonly known as:  ZOFRAN ODT Take 1 tablet (4 mg total) by mouth  every 8 (eight) hours as needed for nausea or vomiting.   oxyCODONE-acetaminophen 5-325 MG tablet Commonly known as:  ROXICET Take 1 tablet by mouth every 4 (four) hours as needed for severe pain.   tamsulosin 0.4 MG Caps capsule Commonly known as:  FLOMAX Take 1 capsule (0.4 mg total) by mouth daily.       Allergies:  Allergies  Allergen Reactions  . Plaquenil [Hydroxychloroquine] Rash    Family History: Family History  Problem Relation Age of Onset  . Kidney cancer Maternal Grandmother   . Prostate cancer Neg Hx     Social History:  reports that she has quit smoking. She has never used smokeless tobacco. She reports that she drinks alcohol. She reports that she does not use drugs.  ROS: UROLOGY Frequent Urination?: No Hard to postpone urination?: No Burning/pain with urination?: No Get up at night to urinate?: No Leakage of urine?: No Urine stream starts and stops?: No Trouble starting stream?: No Do you have to strain to urinate?: No Blood in urine?: No Urinary tract infection?: No Sexually transmitted disease?: No Injury to kidneys or bladder?: No Painful intercourse?: No Weak stream?: No Currently pregnant?: No Vaginal bleeding?: No Last menstrual period?: n  Gastrointestinal Nausea?: No Vomiting?: No Indigestion/heartburn?: No Diarrhea?: No Constipation?: No  Constitutional Fever: No Night sweats?: No Weight loss?: No Fatigue?:  No  Skin Skin rash/lesions?: Yes Itching?: Yes  Eyes Blurred vision?: No Double vision?: No  Ears/Nose/Throat Sore throat?: No Sinus problems?: No  Hematologic/Lymphatic Swollen glands?: No Easy bruising?: No  Cardiovascular Leg swelling?: Yes Chest pain?: No  Respiratory Cough?: No Shortness of breath?: No  Endocrine Excessive thirst?: No  Musculoskeletal Back pain?: Yes Joint pain?: Yes  Neurological Headaches?: Yes Dizziness?: No  Psychologic Depression?: Yes Anxiety?: No  Physical  Exam: BP (!) 142/94 (BP Location: Left Arm, Patient Position: Sitting, Cuff Size: Large)   Pulse 89   Ht 5\' 2"  (1.575 m)   Wt 221 lb (100.2 kg)   BMI 40.42 kg/m   Constitutional:  Alert and oriented, No acute distress. HEENT: [place] AT, moist mucus membranes.  Trachea midline, no masses. Cardiovascular: No clubbing, cyanosis, or edema. Respiratory: Normal respiratory effort, no increased work of breathing. GI: Abdomen is soft, nontender, nondistended, no abdominal masses GU: No CVA tenderness.  Skin: No rashes, bruises or suspicious lesions. Lymph: No cervical or inguinal adenopathy. Neurologic: Grossly intact, no focal deficits, moving all 4 extremities. Psychiatric: Normal mood and affect.  Laboratory Data: Lab Results  Component Value Date   WBC 15.7 (H) 12/16/2015   HGB 14.5 12/16/2015   HCT 44.2 12/16/2015   MCV 88.9 12/16/2015   PLT 246 12/16/2015    Lab Results  Component Value Date   CREATININE 1.05 (H) 12/16/2015    No results found for: PSA  No results found for: TESTOSTERONE  No results found for: HGBA1C  Urinalysis    Component Value Date/Time   COLORURINE AMBER (A) 12/16/2015 2320   APPEARANCEUR CLOUDY (A) 12/16/2015 2320   LABSPEC 1.028 12/16/2015 2320   PHURINE 5.0 12/16/2015 2320   GLUCOSEU NEGATIVE 12/16/2015 2320   HGBUR 3+ (A) 12/16/2015 2320   BILIRUBINUR NEGATIVE 12/16/2015 2320   KETONESUR NEGATIVE 12/16/2015 2320   PROTEINUR 100 (A) 12/16/2015 2320   NITRITE NEGATIVE 12/16/2015 2320   LEUKOCYTESUR NEGATIVE 12/16/2015 2320    Assessment & Plan:    1. Nephrolithiasis -Stone free. Will send stone for analysis. We discussed ways to prevent stones in the future particularly for calcium oxalate stones at this is what she ends up having. She was given ABCs of stone formation. We'll send her a copy of her stone analysis when available. She'll follow-up otherwise when necessary.  Return if symptoms worsen or fail to improve.  Nickie Retort,  MD  [facility] [address] [place], [place] [address] [phone]

## 2016-01-14 ENCOUNTER — Other Ambulatory Visit: Payer: Self-pay | Admitting: Urology

## 2016-01-17 ENCOUNTER — Telehealth: Payer: Self-pay

## 2016-01-17 NOTE — Telephone Encounter (Signed)
Nickie Retort, MD  Lestine Box, LPN        Please let patient know her stone was 90% calcium oxalate which is the most common stone type that we talked about at her visit. The remainder was 10% calcium phosphate. Following the tips in the ABCs of stones booklet will help her prevent future stones. Thanks.    LMOM- stone 90% calcium oxalate. Talked about at visit. Follow ABC stone protocol booklet.

## 2016-01-22 ENCOUNTER — Other Ambulatory Visit: Payer: Self-pay | Admitting: Urology

## 2016-02-09 DIAGNOSIS — J0100 Acute maxillary sinusitis, unspecified: Secondary | ICD-10-CM | POA: Diagnosis not present

## 2016-02-11 DIAGNOSIS — G4733 Obstructive sleep apnea (adult) (pediatric): Secondary | ICD-10-CM | POA: Diagnosis not present

## 2016-03-10 DIAGNOSIS — F419 Anxiety disorder, unspecified: Secondary | ICD-10-CM | POA: Diagnosis not present

## 2016-03-10 DIAGNOSIS — Z6841 Body Mass Index (BMI) 40.0 and over, adult: Secondary | ICD-10-CM | POA: Diagnosis not present

## 2016-03-10 DIAGNOSIS — L309 Dermatitis, unspecified: Secondary | ICD-10-CM | POA: Diagnosis not present

## 2016-03-10 DIAGNOSIS — L210 Seborrhea capitis: Secondary | ICD-10-CM | POA: Diagnosis not present

## 2016-03-11 DIAGNOSIS — G4733 Obstructive sleep apnea (adult) (pediatric): Secondary | ICD-10-CM | POA: Diagnosis not present

## 2016-03-11 DIAGNOSIS — E785 Hyperlipidemia, unspecified: Secondary | ICD-10-CM | POA: Diagnosis not present

## 2016-03-11 DIAGNOSIS — Z6841 Body Mass Index (BMI) 40.0 and over, adult: Secondary | ICD-10-CM | POA: Diagnosis not present

## 2016-03-11 DIAGNOSIS — E669 Obesity, unspecified: Secondary | ICD-10-CM | POA: Insufficient documentation

## 2016-03-11 DIAGNOSIS — R5383 Other fatigue: Secondary | ICD-10-CM | POA: Diagnosis not present

## 2016-03-11 DIAGNOSIS — R5381 Other malaise: Secondary | ICD-10-CM | POA: Diagnosis not present

## 2016-03-17 DIAGNOSIS — L940 Localized scleroderma [morphea]: Secondary | ICD-10-CM | POA: Diagnosis not present

## 2016-03-17 DIAGNOSIS — L988 Other specified disorders of the skin and subcutaneous tissue: Secondary | ICD-10-CM | POA: Diagnosis not present

## 2016-03-17 DIAGNOSIS — Z6841 Body Mass Index (BMI) 40.0 and over, adult: Secondary | ICD-10-CM | POA: Diagnosis not present

## 2016-03-27 DIAGNOSIS — G4733 Obstructive sleep apnea (adult) (pediatric): Secondary | ICD-10-CM | POA: Insufficient documentation

## 2016-03-31 DIAGNOSIS — H524 Presbyopia: Secondary | ICD-10-CM | POA: Diagnosis not present

## 2016-03-31 DIAGNOSIS — H2513 Age-related nuclear cataract, bilateral: Secondary | ICD-10-CM | POA: Diagnosis not present

## 2016-04-01 DIAGNOSIS — R0602 Shortness of breath: Secondary | ICD-10-CM | POA: Diagnosis not present

## 2016-04-17 DIAGNOSIS — Z713 Dietary counseling and surveillance: Secondary | ICD-10-CM | POA: Diagnosis not present

## 2016-04-28 DIAGNOSIS — I872 Venous insufficiency (chronic) (peripheral): Secondary | ICD-10-CM | POA: Diagnosis not present

## 2016-04-28 DIAGNOSIS — L930 Discoid lupus erythematosus: Secondary | ICD-10-CM | POA: Diagnosis not present

## 2016-04-28 DIAGNOSIS — L940 Localized scleroderma [morphea]: Secondary | ICD-10-CM | POA: Diagnosis not present

## 2016-04-28 DIAGNOSIS — Z0189 Encounter for other specified special examinations: Secondary | ICD-10-CM | POA: Diagnosis not present

## 2016-05-09 DIAGNOSIS — E063 Autoimmune thyroiditis: Secondary | ICD-10-CM | POA: Diagnosis not present

## 2016-05-09 DIAGNOSIS — Z114 Encounter for screening for human immunodeficiency virus [HIV]: Secondary | ICD-10-CM | POA: Diagnosis not present

## 2016-05-09 DIAGNOSIS — E039 Hypothyroidism, unspecified: Secondary | ICD-10-CM | POA: Diagnosis not present

## 2016-05-09 DIAGNOSIS — E669 Obesity, unspecified: Secondary | ICD-10-CM | POA: Diagnosis not present

## 2016-05-09 DIAGNOSIS — E785 Hyperlipidemia, unspecified: Secondary | ICD-10-CM | POA: Diagnosis not present

## 2016-05-09 DIAGNOSIS — L940 Localized scleroderma [morphea]: Secondary | ICD-10-CM | POA: Diagnosis not present

## 2016-05-16 DIAGNOSIS — M8589 Other specified disorders of bone density and structure, multiple sites: Secondary | ICD-10-CM | POA: Diagnosis not present

## 2016-05-27 DIAGNOSIS — K449 Diaphragmatic hernia without obstruction or gangrene: Secondary | ICD-10-CM | POA: Diagnosis not present

## 2016-05-27 DIAGNOSIS — Z6841 Body Mass Index (BMI) 40.0 and over, adult: Secondary | ICD-10-CM | POA: Diagnosis not present

## 2016-05-28 DIAGNOSIS — E7870 Disorder of bile acid and cholesterol metabolism, unspecified: Secondary | ICD-10-CM | POA: Diagnosis not present

## 2016-05-28 DIAGNOSIS — E063 Autoimmune thyroiditis: Secondary | ICD-10-CM | POA: Diagnosis not present

## 2016-05-29 DIAGNOSIS — J209 Acute bronchitis, unspecified: Secondary | ICD-10-CM | POA: Diagnosis not present

## 2016-05-29 DIAGNOSIS — Z6841 Body Mass Index (BMI) 40.0 and over, adult: Secondary | ICD-10-CM | POA: Diagnosis not present

## 2016-05-29 DIAGNOSIS — R05 Cough: Secondary | ICD-10-CM | POA: Diagnosis not present

## 2016-06-02 DIAGNOSIS — Z01818 Encounter for other preprocedural examination: Secondary | ICD-10-CM | POA: Diagnosis not present

## 2016-06-03 DIAGNOSIS — Z0000 Encounter for general adult medical examination without abnormal findings: Secondary | ICD-10-CM | POA: Diagnosis not present

## 2016-06-03 DIAGNOSIS — M859 Disorder of bone density and structure, unspecified: Secondary | ICD-10-CM | POA: Diagnosis not present

## 2016-06-03 DIAGNOSIS — M5136 Other intervertebral disc degeneration, lumbar region: Secondary | ICD-10-CM | POA: Diagnosis not present

## 2016-06-03 DIAGNOSIS — G4739 Other sleep apnea: Secondary | ICD-10-CM | POA: Diagnosis not present

## 2016-06-03 DIAGNOSIS — R7309 Other abnormal glucose: Secondary | ICD-10-CM | POA: Diagnosis not present

## 2016-06-03 DIAGNOSIS — Z6841 Body Mass Index (BMI) 40.0 and over, adult: Secondary | ICD-10-CM | POA: Diagnosis not present

## 2016-06-03 DIAGNOSIS — Z1389 Encounter for screening for other disorder: Secondary | ICD-10-CM | POA: Diagnosis not present

## 2016-06-03 DIAGNOSIS — E7870 Disorder of bile acid and cholesterol metabolism, unspecified: Secondary | ICD-10-CM | POA: Diagnosis not present

## 2016-06-16 DIAGNOSIS — F419 Anxiety disorder, unspecified: Secondary | ICD-10-CM | POA: Diagnosis not present

## 2016-06-16 DIAGNOSIS — Z79899 Other long term (current) drug therapy: Secondary | ICD-10-CM | POA: Diagnosis not present

## 2016-06-16 DIAGNOSIS — F329 Major depressive disorder, single episode, unspecified: Secondary | ICD-10-CM | POA: Diagnosis not present

## 2016-06-16 DIAGNOSIS — Z7952 Long term (current) use of systemic steroids: Secondary | ICD-10-CM | POA: Diagnosis not present

## 2016-06-16 DIAGNOSIS — M797 Fibromyalgia: Secondary | ICD-10-CM | POA: Diagnosis not present

## 2016-06-16 DIAGNOSIS — Z6841 Body Mass Index (BMI) 40.0 and over, adult: Secondary | ICD-10-CM | POA: Diagnosis not present

## 2016-06-16 DIAGNOSIS — Z87891 Personal history of nicotine dependence: Secondary | ICD-10-CM | POA: Diagnosis not present

## 2016-06-16 DIAGNOSIS — E7800 Pure hypercholesterolemia, unspecified: Secondary | ICD-10-CM | POA: Diagnosis not present

## 2016-06-16 DIAGNOSIS — G4730 Sleep apnea, unspecified: Secondary | ICD-10-CM | POA: Diagnosis not present

## 2016-06-16 DIAGNOSIS — G4733 Obstructive sleep apnea (adult) (pediatric): Secondary | ICD-10-CM | POA: Diagnosis not present

## 2016-06-16 DIAGNOSIS — M1990 Unspecified osteoarthritis, unspecified site: Secondary | ICD-10-CM | POA: Diagnosis not present

## 2016-06-16 DIAGNOSIS — K449 Diaphragmatic hernia without obstruction or gangrene: Secondary | ICD-10-CM | POA: Diagnosis not present

## 2016-06-16 DIAGNOSIS — M349 Systemic sclerosis, unspecified: Secondary | ICD-10-CM | POA: Diagnosis not present

## 2016-07-03 DIAGNOSIS — Z713 Dietary counseling and surveillance: Secondary | ICD-10-CM | POA: Diagnosis not present

## 2016-07-16 DIAGNOSIS — G4733 Obstructive sleep apnea (adult) (pediatric): Secondary | ICD-10-CM | POA: Diagnosis not present

## 2016-07-21 DIAGNOSIS — L940 Localized scleroderma [morphea]: Secondary | ICD-10-CM | POA: Diagnosis not present

## 2016-07-21 DIAGNOSIS — L930 Discoid lupus erythematosus: Secondary | ICD-10-CM | POA: Diagnosis not present

## 2016-07-21 DIAGNOSIS — I872 Venous insufficiency (chronic) (peripheral): Secondary | ICD-10-CM | POA: Diagnosis not present

## 2016-07-21 DIAGNOSIS — L564 Polymorphous light eruption: Secondary | ICD-10-CM | POA: Diagnosis not present

## 2016-08-18 DIAGNOSIS — E039 Hypothyroidism, unspecified: Secondary | ICD-10-CM | POA: Diagnosis not present

## 2016-08-18 DIAGNOSIS — L930 Discoid lupus erythematosus: Secondary | ICD-10-CM | POA: Diagnosis not present

## 2016-09-12 DIAGNOSIS — Z9989 Dependence on other enabling machines and devices: Secondary | ICD-10-CM | POA: Diagnosis not present

## 2016-09-12 DIAGNOSIS — Z7189 Other specified counseling: Secondary | ICD-10-CM | POA: Diagnosis not present

## 2016-09-12 DIAGNOSIS — G4733 Obstructive sleep apnea (adult) (pediatric): Secondary | ICD-10-CM | POA: Diagnosis not present

## 2016-09-15 DIAGNOSIS — G4733 Obstructive sleep apnea (adult) (pediatric): Secondary | ICD-10-CM | POA: Diagnosis not present

## 2016-09-19 DIAGNOSIS — Z9884 Bariatric surgery status: Secondary | ICD-10-CM | POA: Diagnosis not present

## 2016-09-19 DIAGNOSIS — Z713 Dietary counseling and surveillance: Secondary | ICD-10-CM | POA: Diagnosis not present

## 2016-10-15 DIAGNOSIS — M7061 Trochanteric bursitis, right hip: Secondary | ICD-10-CM | POA: Diagnosis not present

## 2016-10-15 DIAGNOSIS — L930 Discoid lupus erythematosus: Secondary | ICD-10-CM | POA: Diagnosis not present

## 2016-10-15 DIAGNOSIS — R945 Abnormal results of liver function studies: Secondary | ICD-10-CM | POA: Diagnosis not present

## 2016-10-15 DIAGNOSIS — L940 Localized scleroderma [morphea]: Secondary | ICD-10-CM | POA: Diagnosis not present

## 2016-10-15 DIAGNOSIS — M7051 Other bursitis of knee, right knee: Secondary | ICD-10-CM | POA: Diagnosis not present

## 2016-12-08 DIAGNOSIS — L940 Localized scleroderma [morphea]: Secondary | ICD-10-CM | POA: Diagnosis not present

## 2016-12-08 DIAGNOSIS — Z1159 Encounter for screening for other viral diseases: Secondary | ICD-10-CM | POA: Diagnosis not present

## 2016-12-08 DIAGNOSIS — L930 Discoid lupus erythematosus: Secondary | ICD-10-CM | POA: Diagnosis not present

## 2016-12-08 DIAGNOSIS — I872 Venous insufficiency (chronic) (peripheral): Secondary | ICD-10-CM | POA: Diagnosis not present

## 2016-12-08 DIAGNOSIS — L564 Polymorphous light eruption: Secondary | ICD-10-CM | POA: Diagnosis not present

## 2016-12-10 DIAGNOSIS — G479 Sleep disorder, unspecified: Secondary | ICD-10-CM | POA: Diagnosis not present

## 2016-12-10 DIAGNOSIS — Z23 Encounter for immunization: Secondary | ICD-10-CM | POA: Diagnosis not present

## 2016-12-10 DIAGNOSIS — E669 Obesity, unspecified: Secondary | ICD-10-CM | POA: Diagnosis not present

## 2016-12-10 DIAGNOSIS — Z6835 Body mass index (BMI) 35.0-35.9, adult: Secondary | ICD-10-CM | POA: Diagnosis not present

## 2016-12-17 DIAGNOSIS — E669 Obesity, unspecified: Secondary | ICD-10-CM | POA: Diagnosis not present

## 2016-12-17 DIAGNOSIS — Z9989 Dependence on other enabling machines and devices: Secondary | ICD-10-CM | POA: Diagnosis not present

## 2016-12-17 DIAGNOSIS — E039 Hypothyroidism, unspecified: Secondary | ICD-10-CM | POA: Diagnosis not present

## 2016-12-17 DIAGNOSIS — G479 Sleep disorder, unspecified: Secondary | ICD-10-CM | POA: Diagnosis not present

## 2016-12-23 DIAGNOSIS — G4733 Obstructive sleep apnea (adult) (pediatric): Secondary | ICD-10-CM | POA: Diagnosis not present

## 2016-12-23 DIAGNOSIS — E039 Hypothyroidism, unspecified: Secondary | ICD-10-CM | POA: Diagnosis not present

## 2016-12-23 DIAGNOSIS — Z9884 Bariatric surgery status: Secondary | ICD-10-CM | POA: Diagnosis not present

## 2016-12-23 DIAGNOSIS — Z6834 Body mass index (BMI) 34.0-34.9, adult: Secondary | ICD-10-CM | POA: Diagnosis not present

## 2017-03-27 DIAGNOSIS — L309 Dermatitis, unspecified: Secondary | ICD-10-CM | POA: Diagnosis not present

## 2017-04-01 DIAGNOSIS — H04123 Dry eye syndrome of bilateral lacrimal glands: Secondary | ICD-10-CM | POA: Diagnosis not present

## 2017-04-01 DIAGNOSIS — Z87891 Personal history of nicotine dependence: Secondary | ICD-10-CM | POA: Diagnosis not present

## 2017-04-01 DIAGNOSIS — H527 Unspecified disorder of refraction: Secondary | ICD-10-CM | POA: Diagnosis not present

## 2017-04-01 DIAGNOSIS — H2513 Age-related nuclear cataract, bilateral: Secondary | ICD-10-CM | POA: Diagnosis not present

## 2017-04-02 DIAGNOSIS — Z1231 Encounter for screening mammogram for malignant neoplasm of breast: Secondary | ICD-10-CM | POA: Diagnosis not present

## 2017-04-02 LAB — HM MAMMOGRAPHY

## 2017-04-10 DIAGNOSIS — E669 Obesity, unspecified: Secondary | ICD-10-CM | POA: Diagnosis not present

## 2017-04-10 DIAGNOSIS — E0789 Other specified disorders of thyroid: Secondary | ICD-10-CM | POA: Diagnosis not present

## 2017-04-10 DIAGNOSIS — Z9884 Bariatric surgery status: Secondary | ICD-10-CM | POA: Diagnosis not present

## 2017-04-10 DIAGNOSIS — Z713 Dietary counseling and surveillance: Secondary | ICD-10-CM | POA: Diagnosis not present

## 2017-04-10 DIAGNOSIS — R21 Rash and other nonspecific skin eruption: Secondary | ICD-10-CM | POA: Diagnosis not present

## 2017-04-10 DIAGNOSIS — Z5181 Encounter for therapeutic drug level monitoring: Secondary | ICD-10-CM | POA: Diagnosis not present

## 2017-04-27 DIAGNOSIS — H524 Presbyopia: Secondary | ICD-10-CM | POA: Diagnosis not present

## 2017-05-01 DIAGNOSIS — M7711 Lateral epicondylitis, right elbow: Secondary | ICD-10-CM | POA: Diagnosis not present

## 2017-05-01 DIAGNOSIS — M79675 Pain in left toe(s): Secondary | ICD-10-CM | POA: Diagnosis not present

## 2017-05-01 DIAGNOSIS — M25521 Pain in right elbow: Secondary | ICD-10-CM | POA: Diagnosis not present

## 2017-05-01 DIAGNOSIS — L930 Discoid lupus erythematosus: Secondary | ICD-10-CM | POA: Diagnosis not present

## 2017-05-01 DIAGNOSIS — M19072 Primary osteoarthritis, left ankle and foot: Secondary | ICD-10-CM | POA: Diagnosis not present

## 2017-05-01 DIAGNOSIS — M25511 Pain in right shoulder: Secondary | ICD-10-CM | POA: Diagnosis not present

## 2017-05-01 DIAGNOSIS — M19011 Primary osteoarthritis, right shoulder: Secondary | ICD-10-CM | POA: Diagnosis not present

## 2017-05-08 DIAGNOSIS — M7710 Lateral epicondylitis, unspecified elbow: Secondary | ICD-10-CM | POA: Diagnosis not present

## 2017-05-08 DIAGNOSIS — M19011 Primary osteoarthritis, right shoulder: Secondary | ICD-10-CM | POA: Diagnosis not present

## 2017-05-08 DIAGNOSIS — Z029 Encounter for administrative examinations, unspecified: Secondary | ICD-10-CM | POA: Diagnosis not present

## 2017-05-14 ENCOUNTER — Encounter: Payer: Self-pay | Admitting: Internal Medicine

## 2017-05-18 DIAGNOSIS — I872 Venous insufficiency (chronic) (peripheral): Secondary | ICD-10-CM | POA: Diagnosis not present

## 2017-05-18 DIAGNOSIS — L940 Localized scleroderma [morphea]: Secondary | ICD-10-CM | POA: Diagnosis not present

## 2017-05-18 DIAGNOSIS — L930 Discoid lupus erythematosus: Secondary | ICD-10-CM | POA: Diagnosis not present

## 2017-06-03 DIAGNOSIS — M859 Disorder of bone density and structure, unspecified: Secondary | ICD-10-CM | POA: Diagnosis not present

## 2017-06-03 DIAGNOSIS — Z0000 Encounter for general adult medical examination without abnormal findings: Secondary | ICD-10-CM | POA: Diagnosis not present

## 2017-06-03 DIAGNOSIS — M8580 Other specified disorders of bone density and structure, unspecified site: Secondary | ICD-10-CM | POA: Diagnosis not present

## 2017-06-03 DIAGNOSIS — R7309 Other abnormal glucose: Secondary | ICD-10-CM | POA: Diagnosis not present

## 2017-06-03 DIAGNOSIS — E063 Autoimmune thyroiditis: Secondary | ICD-10-CM | POA: Diagnosis not present

## 2017-06-22 DIAGNOSIS — R21 Rash and other nonspecific skin eruption: Secondary | ICD-10-CM | POA: Diagnosis not present

## 2017-06-22 DIAGNOSIS — L309 Dermatitis, unspecified: Secondary | ICD-10-CM | POA: Diagnosis not present

## 2017-07-07 ENCOUNTER — Encounter: Payer: Self-pay | Admitting: Podiatry

## 2017-07-07 ENCOUNTER — Other Ambulatory Visit: Payer: Self-pay

## 2017-07-07 ENCOUNTER — Ambulatory Visit (INDEPENDENT_AMBULATORY_CARE_PROVIDER_SITE_OTHER): Payer: BLUE CROSS/BLUE SHIELD | Admitting: Podiatry

## 2017-07-07 DIAGNOSIS — L600 Ingrowing nail: Secondary | ICD-10-CM | POA: Diagnosis not present

## 2017-07-07 MED ORDER — NEOMYCIN-POLYMYXIN-HC 1 % OT SOLN: OTIC | 1 refills | Status: DC

## 2017-07-07 NOTE — Progress Notes (Signed)
Subjective:  Patient ID: Helen Freeman, female    DOB: [birth year],  MRN: [id number] HPI Chief Complaint  Patient presents with  . Nail Problem    bilateral great toes; pt stated, "been dealing with ingrowns for years but the left one is tender to touch"    57 y.o. female presents with the above complaint.   ROS: Denies fever chills nausea vomiting muscle aches pains calf pain back pain chest pain shortness of breath.  Past Medical History:  Diagnosis Date  . Anxiety   . Arthritis   . Depression   . Hashimoto's disease   . Hyperlipemia   . Hypothyroidism   . Lupus ([place])   . Morphea   . Sleep apnea    Past Surgical History:  Procedure Laterality Date  . EXTRACORPOREAL SHOCK WAVE LITHOTRIPSY Left 12/20/2015   Procedure: EXTRACORPOREAL SHOCK WAVE LITHOTRIPSY (ESWL);  Surgeon: Nickie Retort, MD;  Location: [facility] ORS;  Service: Urology;  Laterality: Left;  . LAPAROSCOPIC TOTAL HYSTERECTOMY  2004  . OTHER SURGICAL HISTORY    . ROTATOR CUFF REPAIR Right     Current Outpatient Medications:  .  atorvastatin (LIPITOR) 20 MG tablet, Take 20 mg by mouth daily., Disp: , Rfl:  .  azithromycin (ZITHROMAX) 250 MG tablet, azithromycin 250 mg tablet, Disp: , Rfl:  .  Carboxymethylcellulose Sodium (THERATEARS) 0.25 % SOLN, Administer 2 drops to both eyes daily as needed., Disp: , Rfl:  .  cetirizine (ZYRTEC) 10 MG tablet, Take by mouth., Disp: , Rfl:  .  clobetasol ointment (TEMOVATE) 0.05 %, clobetasol 0.05 % topical ointment, Disp: , Rfl:  .  cycloSPORINE (RESTASIS MULTIDOSE) 0.05 % ophthalmic emulsion, Administer 2 drops to both eyes every morning., Disp: , Rfl:  .  doxycycline (MONODOX) 100 MG capsule, Take by mouth., Disp: , Rfl:  .  DULoxetine (CYMBALTA) 20 MG capsule, Take by mouth., Disp: , Rfl:  .  eletriptan (RELPAX) 40 MG tablet, Take by mouth., Disp: , Rfl:  .  fluticasone (FLONASE) 50 MCG/ACT nasal spray, fluticasone propionate 50 mcg/actuation nasal spray,suspension,  Disp: , Rfl:  .  hydrocortisone 2.5 % cream, Apply topically., Disp: , Rfl:  .  hydrOXYzine (ATARAX/VISTARIL) 10 MG tablet, Take 20 mg by mouth 4 (four) times daily as needed., Disp: , Rfl:  .  levothyroxine (SYNTHROID, LEVOTHROID) 100 MCG tablet, Take 175 mcg by mouth daily before breakfast. , Disp: , Rfl:  .  mycophenolate (CELLCEPT) 500 MG tablet, Take 1,000 mg by mouth daily., Disp: , Rfl:  .  mycophenolate (CELLCEPT) 500 MG tablet, Take 500 mg by mouth at bedtime., Disp: , Rfl:  .  omeprazole (PRILOSEC) 40 MG capsule, omeprazole 40 mg capsule,delayed release, Disp: , Rfl:  .  oxyCODONE-acetaminophen (ROXICET) 5-325 MG tablet, Take 1 tablet by mouth every 4 (four) hours as needed for severe pain., Disp: 30 tablet, Rfl: 0 .  rizatriptan (MAXALT) 10 MG tablet, Take by mouth., Disp: , Rfl:  .  tamsulosin (FLOMAX) 0.4 MG CAPS capsule, Take 1 capsule (0.4 mg total) by mouth daily., Disp: 7 capsule, Rfl: 0 .  enoxaparin (LOVENOX) 40 MG/0.4ML injection, Inject into the skin., Disp: , Rfl:  .  guaiFENesin-codeine 100-10 MG/5ML syrup, Take by mouth., Disp: , Rfl:  .  loratadine (CLARITIN) 10 MG tablet, loratadine 10 mg tablet   1 tablet every day by oral route., Disp: , Rfl:  .  methotrexate (RHEUMATREX) 2.5 MG tablet, Take by mouth., Disp: , Rfl:  .  NEOMYCIN-POLYMYXIN-HYDROCORTISONE (CORTISPORIN) 1 % SOLN  OTIC solution, Apply 1-2 drops to toe BID after soaking, Disp: 10 mL, Rfl: 1 .  ondansetron (ZOFRAN ODT) 4 MG disintegrating tablet, Take 1 tablet (4 mg total) by mouth every 8 (eight) hours as needed for nausea or vomiting. (Patient not taking: Reported on 01/02/2016), Disp: 20 tablet, Rfl: 0 .  predniSONE (DELTASONE) 10 MG tablet, Take by mouth., Disp: , Rfl:   Allergies  Allergen Reactions  . Plaquenil  [Hydroxychloroquine Sulfate] Rash  . Lactose Intolerance (Gi) Diarrhea  . Omega-3 Rash    UV Rays gives her a rash  . Plaquenil [Hydroxychloroquine] Rash   Review of Systems Objective:    There were no vitals filed for this visit.  General: Well developed, nourished, in no acute distress, alert and oriented x3   Dermatological: Skin is warm, dry and supple bilateral. Nails x 10 are well maintained; remaining integument appears unremarkable at this time. There are no open sores, no preulcerative lesions, no rash or signs of infection present.  Sharp incurvated nail margin along the tibial border of the hallux bilaterally.  Painful palpation no purulence no malodor.  Vascular: Dorsalis Pedis artery and Posterior Tibial artery pedal pulses are 2/4 bilateral with immedate capillary fill time. Pedal hair growth present. No varicosities and no lower extremity edema present bilateral.   Neruologic: Grossly intact via light touch bilateral. Vibratory intact via tuning fork bilateral. Protective threshold with Semmes Wienstein monofilament intact to all pedal sites bilateral. Patellar and Achilles deep tendon reflexes 2+ bilateral. No Babinski or clonus noted bilateral.   Musculoskeletal: No gross boney pedal deformities bilateral. No pain, crepitus, or limitation noted with foot and ankle range of motion bilateral. Muscular strength 5/5 in all groups tested bilateral.  Gait: Unassisted, Nonantalgic.    Radiographs:  None taken  Assessment & Plan:   Assessment: Ingrown toenail tibial border of the hallux bilateral.  Plan: Chemical matrixectomy's were performed today to the tibial border of the hallux bilateral after local anesthesia consisting of a 50-50 mixture of Marcaine plain and lidocaine plain was infiltrated in a hallux block bilaterally.  Patient tolerated the matrixectomy well.  Was provided with both oral and written home-going instruction for care and soaking of the toe.  She was also provided a prescription for Cortisporin Otic to be applied twice daily after soaking.  I will follow-up with her in 2 weeks.     Amadou Katzenstein T. [place], [place]

## 2017-07-07 NOTE — Patient Instructions (Signed)

## 2017-07-08 DIAGNOSIS — Z9884 Bariatric surgery status: Secondary | ICD-10-CM | POA: Diagnosis not present

## 2017-07-14 ENCOUNTER — Encounter: Payer: Self-pay | Admitting: Gastroenterology

## 2017-07-14 ENCOUNTER — Ambulatory Visit (INDEPENDENT_AMBULATORY_CARE_PROVIDER_SITE_OTHER): Payer: BLUE CROSS/BLUE SHIELD | Admitting: Gastroenterology

## 2017-07-14 VITALS — BP 119/80 | HR 68 | Ht 62.0 in | Wt 191.6 lb

## 2017-07-14 DIAGNOSIS — R748 Abnormal levels of other serum enzymes: Secondary | ICD-10-CM

## 2017-07-14 DIAGNOSIS — K760 Fatty (change of) liver, not elsewhere classified: Secondary | ICD-10-CM

## 2017-07-14 NOTE — Progress Notes (Signed)
Helen Freeman [address]  [place]  [place], [place] [address]  Main: [phone]  Fax: [phone]   Gastroenterology Consultation  Referring Provider:     [facility] Primary Care Physician:  [name], MD Primary Gastroenterologist:  Dr. Vonda Freeman Reason for Consultation:     Elevated liver enzymes        HPI:    Chief Complaint  Patient presents with  . Establish Care    REFERRAL from [place] [place] ([place]) for abnormal liver results    Helen Freeman is a 57 y.o. y/o female referred for consultation & management  by Dr. [name], MD.  Patient of [facility], referred for elevated liver enzymes.  Records sent from the [place], only includes one rheumatologist clinic note, that also notes that liver enzymes are now normal.  March 2019 showed normal ALT and AST.  Total bilirubin and alk phos is not available in the record.  No other previous records available.  Patient states she used to live in [place], and had liver biopsy done at [facility] in 2010.  She does not know the results, we do not have the records.  Has history of lap scopic gastric sleeve,  in May 2018.  Denies any alcohol use.  Denies any recent hepatotoxic drugs, including no herbal supplements, weight loss products or green tea.  Elevated liver enzymes  Past Medical History:  Diagnosis Date  . Anxiety   . Arthritis   . Depression   . Hashimoto's disease   . Hyperlipemia   . Hypothyroidism   . Lupus ([place])   . Morphea   . Sleep apnea     Past Surgical History:  Procedure Laterality Date  . EXTRACORPOREAL SHOCK WAVE LITHOTRIPSY Left 12/20/2015   Procedure: EXTRACORPOREAL SHOCK WAVE LITHOTRIPSY (ESWL);  Surgeon: Nickie Retort, MD;  Location: [facility] ORS;  Service: Urology;  Laterality: Left;  . LAPAROSCOPIC TOTAL HYSTERECTOMY  2004  . OTHER SURGICAL HISTORY    . ROTATOR CUFF REPAIR Right     Prior to Admission medications   Medication Sig Start Date End  Date Taking? Authorizing Provider  atorvastatin (LIPITOR) 20 MG tablet Take 20 mg by mouth daily.    [provider]  azithromycin (ZITHROMAX) 250 MG tablet azithromycin 250 mg tablet    [provider]  Carboxymethylcellulose Sodium (THERATEARS) 0.25 % SOLN Administer 2 drops to both eyes daily as needed.    [provider]  cetirizine (ZYRTEC) 10 MG tablet Take by mouth.    [provider]  clobetasol ointment (TEMOVATE) 0.05 % clobetasol 0.05 % topical ointment 02/04/11   [provider]  cycloSPORINE (RESTASIS MULTIDOSE) 0.05 % ophthalmic emulsion Administer 2 drops to both eyes every morning.    [provider]  doxycycline (MONODOX) 100 MG capsule Take by mouth. 06/22/17   [provider]  DULoxetine (CYMBALTA) 20 MG capsule Take by mouth.    [provider]  eletriptan (RELPAX) 40 MG tablet Take by mouth.    [provider]  enoxaparin (LOVENOX) 40 MG/0.4ML injection Inject into the skin. 06/16/16   [provider]  fluticasone Asencion Islam) 50 MCG/ACT nasal spray fluticasone propionate 50 mcg/actuation nasal spray,suspension 04/11/15   [provider]  guaiFENesin-codeine 100-10 MG/5ML syrup Take by mouth. 06/05/14   [provider]  hydrocortisone 2.5 % cream Apply topically.    [provider]  hydrOXYzine (ATARAX/VISTARIL) 10 MG tablet Take 20 mg by mouth 4 (four) times daily as needed.  [provider]  levothyroxine (SYNTHROID, LEVOTHROID) 100 MCG tablet Take 175 mcg by mouth daily before breakfast.     [provider]  loratadine (CLARITIN) 10 MG tablet loratadine 10 mg tablet   1 tablet every day by oral route. 09/20/15   [provider]  methotrexate (RHEUMATREX) 2.5 MG tablet Take by mouth.    [provider]  mycophenolate (CELLCEPT) 500 MG tablet Take 1,000 mg by mouth daily.    [provider]  mycophenolate (CELLCEPT) 500 MG  tablet Take 500 mg by mouth at bedtime.    [provider]  NEOMYCIN-POLYMYXIN-HYDROCORTISONE (CORTISPORIN) 1 % SOLN OTIC solution Apply 1-2 drops to toe BID after soaking 07/07/17   Hyatt, Max T, DPM  omeprazole (PRILOSEC) 40 MG capsule omeprazole 40 mg capsule,delayed release    [provider]  ondansetron (ZOFRAN ODT) 4 MG disintegrating tablet Take 1 tablet (4 mg total) by mouth every 8 (eight) hours as needed for nausea or vomiting. Patient not taking: Reported on 01/02/2016 12/17/15   Loney Hering, MD  oxyCODONE-acetaminophen (ROXICET) 5-325 MG tablet Take 1 tablet by mouth every 4 (four) hours as needed for severe pain. 12/19/15   Nickie Retort, MD  predniSONE (DELTASONE) 10 MG tablet Take by mouth. 04/10/17   [provider]  rizatriptan (MAXALT) 10 MG tablet Take by mouth.    [provider]  tamsulosin (FLOMAX) 0.4 MG CAPS capsule Take 1 capsule (0.4 mg total) by mouth daily. 12/17/15   Loney Hering, MD    Family History  Problem Relation Age of Onset  . Kidney cancer Maternal Grandmother   . Prostate cancer Neg Hx      Social History   Tobacco Use  . Smoking status: Former Research scientist (life sciences)  . Smokeless tobacco: Never Used  Substance Use Topics  . Alcohol use: Yes  . Drug use: No    Allergies as of 07/14/2017 - Review Complete 07/14/2017  Allergen Reaction Noted  . Plaquenil  [hydroxychloroquine sulfate] Rash 02/03/2010  . Lactose intolerance (gi) Diarrhea 07/07/2017  . Omega-3 Rash 06/02/2016  . Plaquenil [hydroxychloroquine] Rash 12/16/2015    Review of Systems:    All systems reviewed and negative except where noted in HPI.   Physical Exam:  BP 119/80   Pulse 68   Ht 5' 2"  (1.575 m)   Wt 191 lb 9.6 oz (86.9 kg)   BMI 35.04 kg/m  No LMP recorded. Patient has had a hysterectomy. Psych:  Alert and cooperative. Normal mood and affect. General:   Alert,  Well-developed, well-nourished, pleasant and cooperative in  NAD Head:  Normocephalic and atraumatic. Eyes:  Sclera clear, no icterus.   Conjunctiva pink. Ears:  Normal auditory acuity. Nose:  No deformity, discharge, or lesions. Mouth:  No deformity or lesions,oropharynx pink & moist. Neck:  Supple; no masses or thyromegaly. Lungs:  Respirations even and unlabored.  Clear throughout to auscultation.   No wheezes, crackles, or rhonchi. No acute distress. Heart:  Regular rate and rhythm; no murmurs, clicks, rubs, or gallops. Abdomen:  Normal bowel sounds.  No bruits.  Soft, non-tender and non-distended without masses, hepatosplenomegaly or hernias noted.  No guarding or rebound tenderness.    Msk:  Symmetrical without gross deformities. Good, equal movement & strength bilaterally. Pulses:  Normal pulses noted. Extremities:  No clubbing or edema.  No cyanosis. Neurologic:  Alert and oriented x3;  grossly normal neurologically. Skin:  Intact without significant lesions or rashes. No jaundice. Lymph Nodes:  No  significant cervical adenopathy. Psych:  Alert and cooperative. Normal mood and affect.   Labs: CBC    Component Value Date/Time   WBC 15.7 (H) 12/16/2015 2016   RBC 4.98 12/16/2015 2016   HGB 14.5 12/16/2015 2016   HCT 44.2 12/16/2015 2016   PLT 246 12/16/2015 2016   MCV 88.9 12/16/2015 2016   MCH 29.2 12/16/2015 2016   MCHC 32.8 12/16/2015 2016   RDW 14.2 12/16/2015 2016   CMP     Component Value Date/Time   NA 139 12/16/2015 2016   K 3.9 12/16/2015 2016   CL 105 12/16/2015 2016   CO2 27 12/16/2015 2016   GLUCOSE 144 (H) 12/16/2015 2016   BUN 22 (H) 12/16/2015 2016   CREATININE 1.05 (H) 12/16/2015 2016   CALCIUM 9.0 12/16/2015 2016   PROT 8.0 12/16/2015 2016   ALBUMIN 4.0 12/16/2015 2016   AST 23 12/16/2015 2016   ALT 22 12/16/2015 2016   ALKPHOS 96 12/16/2015 2016   BILITOT 0.7 12/16/2015 2016   GFRNONAA 59 (L) 12/16/2015 2016   GFRAA >60 12/16/2015 2016    Imaging Studies: No results found.  Assessment and Plan:    Evalin Shawhan is a 57 y.o. y/o female has been referred for elevated liver enzymes  Referral note from the [place], notes that liver enzymes are now normal March 2019 labs from them show a normal AST and ALT.  Total bilirubin and alk phos are not available in those records sent I suspect, that patient has underlying fatty liver that likely improved after her gastric sleeve  We will repeat labs at this time, CMP and INR to assess liver function.  Based on these labs, can order further testing as appropriate We will also try to obtain previous records from [place], specifically of the liver biopsy  No clinical evidence of cirrhosis, no confusion, no bleeding, no edema  Patient educated to avoid hepatotoxic drugs.  Weight loss and diet and exercise encouraged.  Patient states colonoscopy is up-to-date, and had one 1 to 2 years ago at [facility].  We will try to obtain this record.  States had 2 colonoscopies at [place] in the past as well.  We will try to obtain these records as well.  No indication for repeat colonoscopy at this time.  Dr Helen Freeman

## 2017-07-21 ENCOUNTER — Ambulatory Visit (INDEPENDENT_AMBULATORY_CARE_PROVIDER_SITE_OTHER): Admitting: Podiatry

## 2017-07-21 ENCOUNTER — Encounter: Payer: Self-pay | Admitting: Podiatry

## 2017-07-21 DIAGNOSIS — Z9889 Other specified postprocedural states: Secondary | ICD-10-CM

## 2017-07-21 DIAGNOSIS — L600 Ingrowing nail: Secondary | ICD-10-CM

## 2017-07-21 NOTE — Progress Notes (Signed)
She presents today for follow-up of her matrixectomy fibular borders of the hallux bilaterally.  She denies fever chills nausea vomiting muscle aches pains calf pain back pain chest pain shortness of breath.  States that she is been soaking in Betadine and water and the toenails seem to be doing well.  Still has a little bit of drainage but nonpainful.  Objective: Vital signs are stable alert and oriented x3.  No erythema edema cellulitis drainage or odor no purulence no malodor.  Assessment: Well-healing surgical toes fibular borders hallux bilateral.  Plan: Continue to soak Epsom salts and warm water once every other day cover during the daytime leave open at bedtime.

## 2017-07-28 DIAGNOSIS — M542 Cervicalgia: Secondary | ICD-10-CM | POA: Diagnosis not present

## 2017-07-28 DIAGNOSIS — R6884 Jaw pain: Secondary | ICD-10-CM | POA: Diagnosis not present

## 2017-08-04 DIAGNOSIS — M542 Cervicalgia: Secondary | ICD-10-CM | POA: Diagnosis not present

## 2017-08-04 DIAGNOSIS — R6884 Jaw pain: Secondary | ICD-10-CM | POA: Diagnosis not present

## 2017-08-24 DIAGNOSIS — G4733 Obstructive sleep apnea (adult) (pediatric): Secondary | ICD-10-CM | POA: Diagnosis not present

## 2017-09-28 DIAGNOSIS — L309 Dermatitis, unspecified: Secondary | ICD-10-CM | POA: Diagnosis not present

## 2017-10-19 ENCOUNTER — Telehealth: Payer: Self-pay | Admitting: Gastroenterology

## 2017-10-19 NOTE — Telephone Encounter (Signed)
Pt had to reschedule appt. Would like to know if she needs to still have labs done before next appt. Pls call

## 2017-10-20 ENCOUNTER — Ambulatory Visit: Payer: No Typology Code available for payment source | Admitting: Gastroenterology

## 2017-10-20 NOTE — Telephone Encounter (Signed)
ERROR

## 2017-10-21 NOTE — Telephone Encounter (Signed)
Left message that Dr. Bonna Gains would like for her to have her labs done.

## 2017-10-22 DIAGNOSIS — J069 Acute upper respiratory infection, unspecified: Secondary | ICD-10-CM | POA: Diagnosis not present

## 2017-10-29 ENCOUNTER — Telehealth: Payer: Self-pay

## 2017-10-29 NOTE — Telephone Encounter (Signed)
Copied from [place] [id number]. Topic: Appointment Scheduling - New Patient >> Oct 28, 2017  4:43 PM Sheran Luz wrote: New patient has been scheduled for your office. Provider: Dr. Terese Door Date of Appointment: 11\7  Route to department's PEC pool.

## 2017-10-30 DIAGNOSIS — Z79899 Other long term (current) drug therapy: Secondary | ICD-10-CM | POA: Diagnosis not present

## 2017-10-30 DIAGNOSIS — L930 Discoid lupus erythematosus: Secondary | ICD-10-CM | POA: Diagnosis not present

## 2017-10-30 DIAGNOSIS — L940 Localized scleroderma [morphea]: Secondary | ICD-10-CM | POA: Diagnosis not present

## 2017-10-30 DIAGNOSIS — Z9884 Bariatric surgery status: Secondary | ICD-10-CM | POA: Diagnosis not present

## 2017-11-04 NOTE — Telephone Encounter (Signed)
NP paperwork mailed out.

## 2017-11-09 IMAGING — CT CT RENAL STONE PROTOCOL
2 of 3 series · 16 of 46 positions shown, 18 images · non-contrast
Comparison: None.

CLINICAL DATA: Acute LEFT flank pain. History of appendectomy and
hysterectomy

EXAM:
CT ABDOMEN AND PELVIS WITHOUT CONTRAST
TECHNIQUE: Multidetector CT imaging of the abdomen and pelvis was performed
following the standard protocol without IV contrast.

[Series 2: axial st · axial · 0.65mm/px · z∈[-397,-7]mm · 13 of 90 slices shown, 15 images]
[im 6/90  soft-tissue]
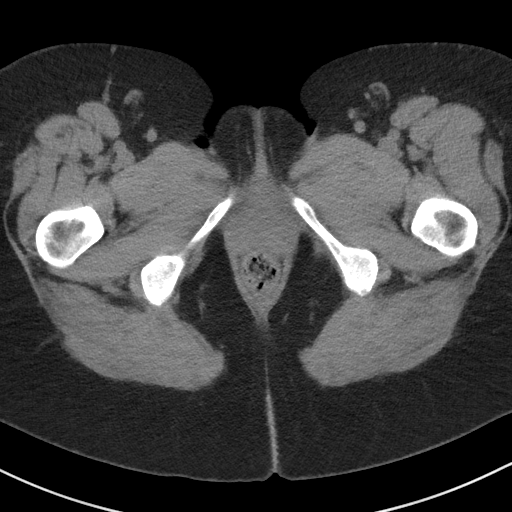
[im 6/90  bone]
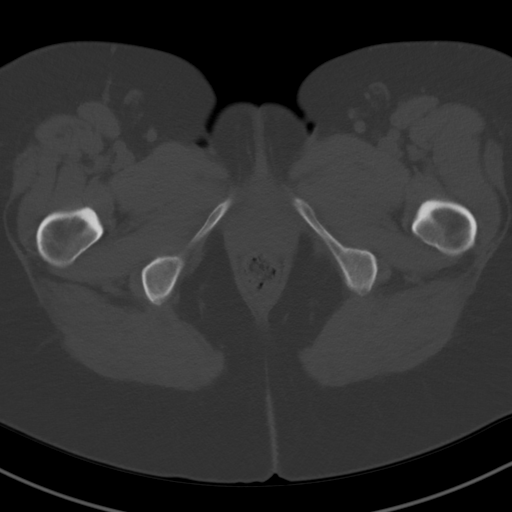
[im 12/90  soft-tissue]
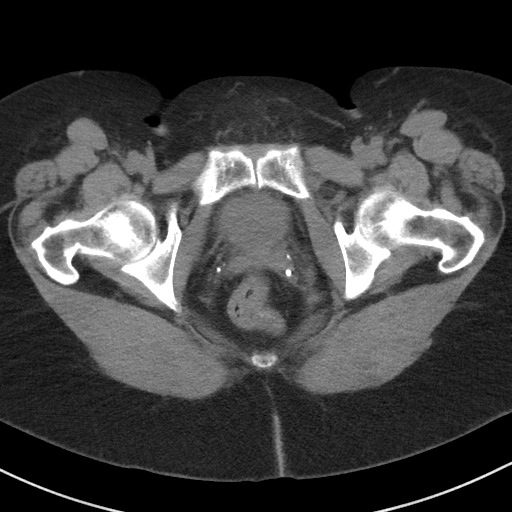
[im 18/90  soft-tissue]
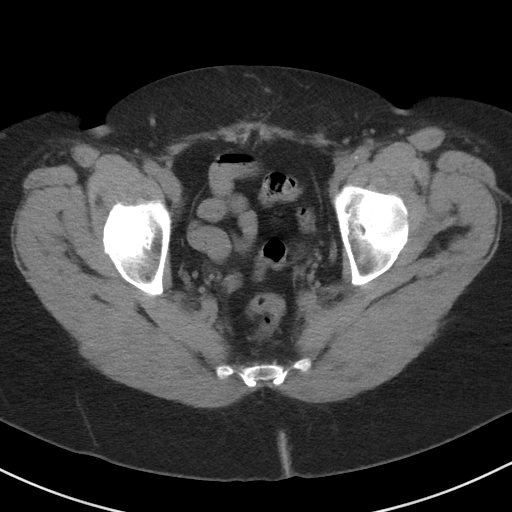
[im 26/90  soft-tissue]
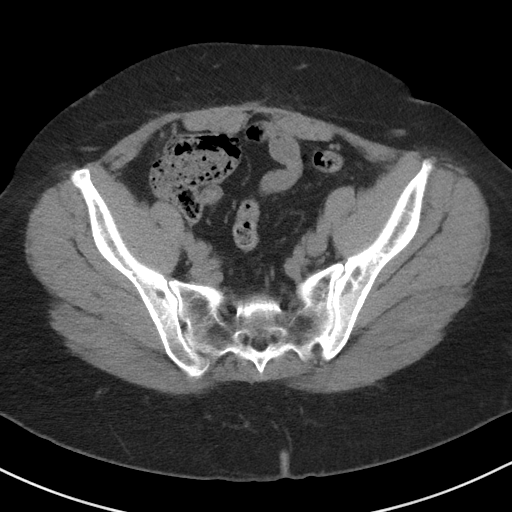
[im 32/90  soft-tissue]
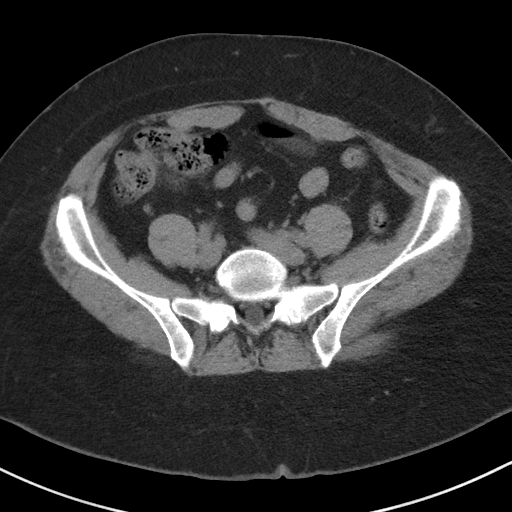
[im 38/90  soft-tissue]
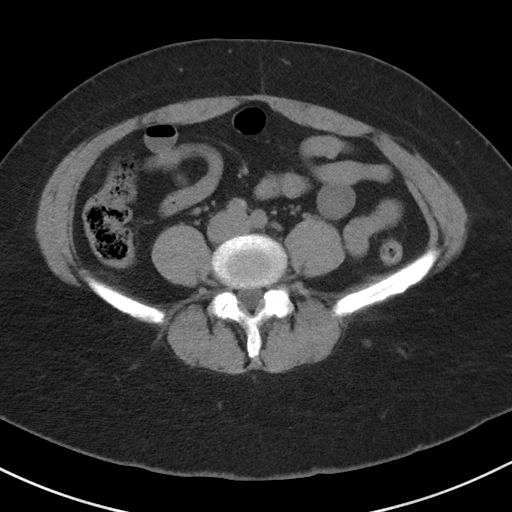
[im 46/90  soft-tissue]
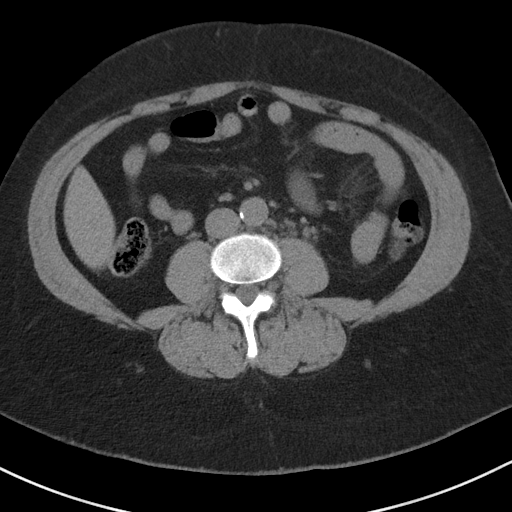
[im 52/90  soft-tissue]
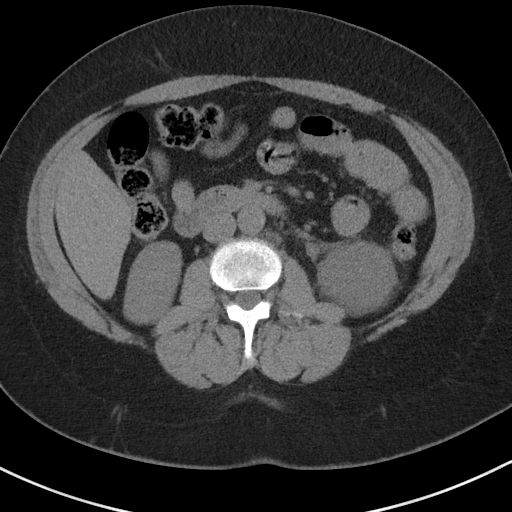
[im 58/90  soft-tissue]
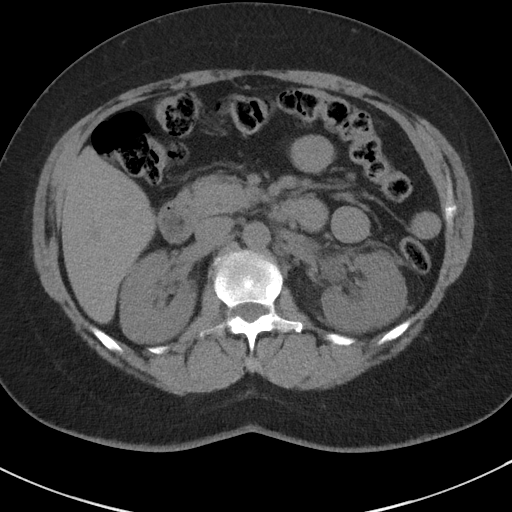
[im 58/90  bone]
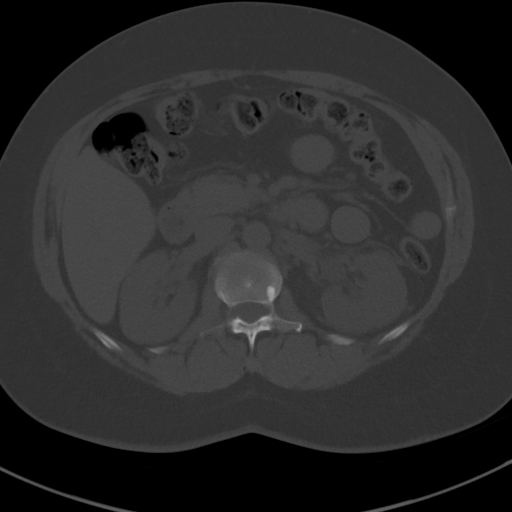
[im 64/90  soft-tissue]
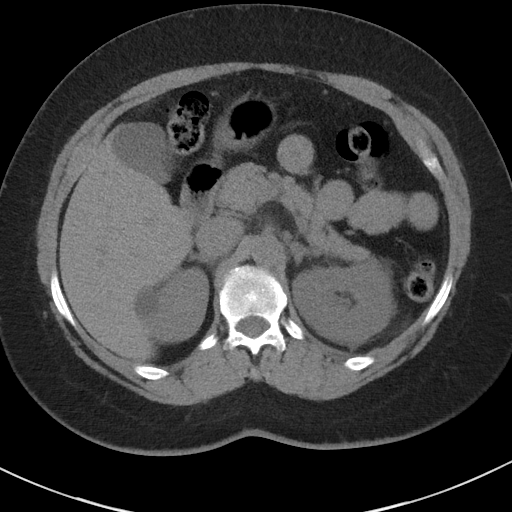
[im 72/90  soft-tissue]
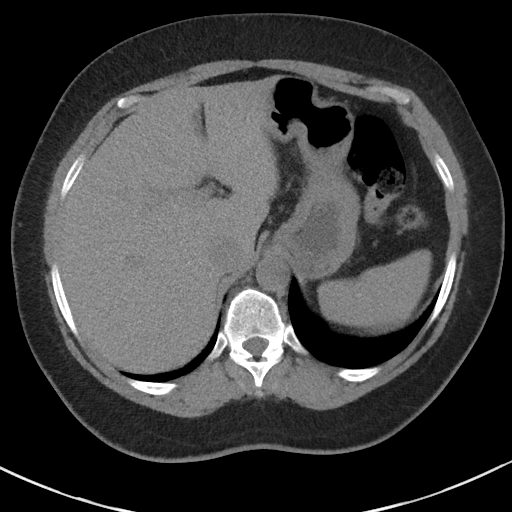
[im 78/90  soft-tissue]
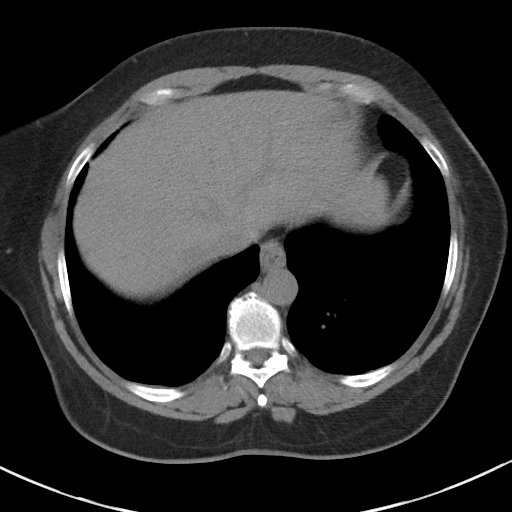
[im 84/90  soft-tissue]
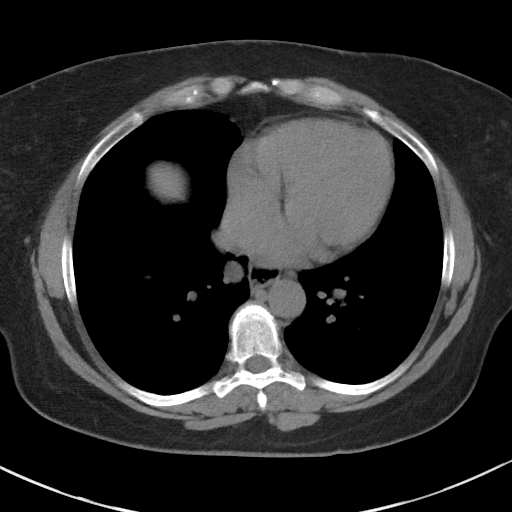

[Series 5: coronal · coronal · 0.75mm/px · 3 of 145 slices shown]
[im 49/145  soft-tissue]
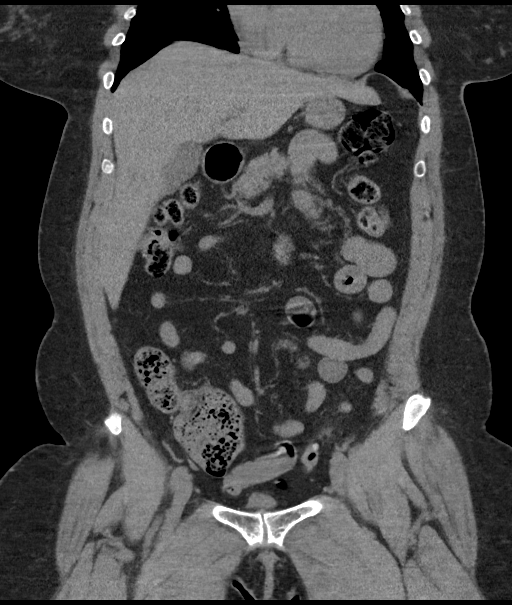
[im 65/145  soft-tissue]
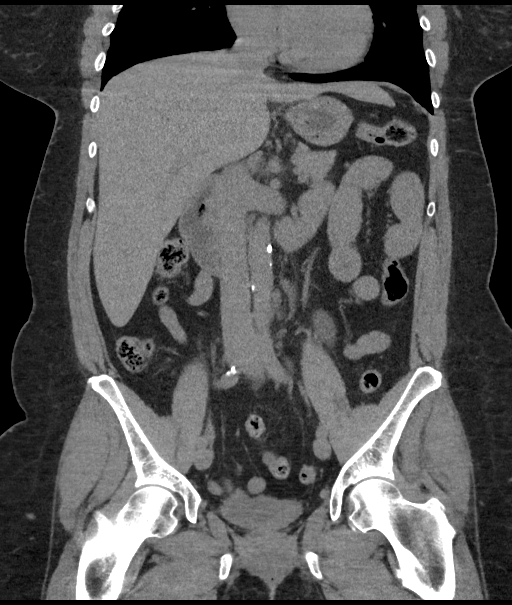
[im 81/145  soft-tissue]
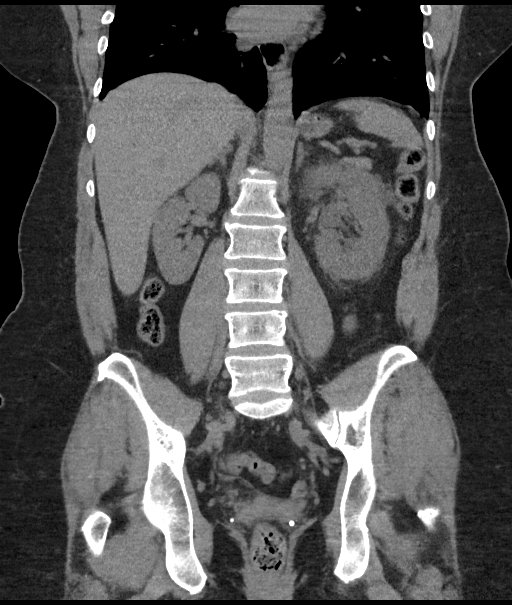

[16 of 46 positions shown; findings below may reference images not displayed]

FINDINGS: Lower chest: Lung bases are clear. Simple fluid density structure
extending inferior from the LEFT inferior pulmonary veins is felt
represent a prominent pericardial recess or other benign
bronchogenic cyst (image number 6, series 2

Hepatobiliary: No focal hepatic lesion. No biliary duct dilatation.
Gallbladder is normal. Common bile duct is normal.

Pancreas: Pancreas is normal. No ductal dilatation. No pancreatic
inflammation.

Spleen: Normal spleen

Adrenals/urinary tract: Adrenal glands normal. There is renal edema
on the LEFT. There is mild hydronephrosis. This is secondary to
obstructing calculus at the LEFT ureteropelvic junction measuring 4
mm (image 40, series 2). This calculus measures 6 mm in craniocaudad
dimension. No more distal ureteral calculi. No bladder calculi. No
RIGHT nephrolithiasis or ureterolithiasis.

Stomach/Bowel: Stomach, small bowel, and cecum are normal. The colon
and rectosigmoid colon are normal.

Vascular/Lymphatic: Abdominal aorta is normal caliber with
atherosclerotic calcification. There is no retroperitoneal or
periportal lymphadenopathy. No pelvic lymphadenopathy.

Reproductive: Post hysterectomy

Other: No free fluid.

Musculoskeletal: No aggressive osseous lesion.
IMPRESSION: Obstructing calculus at the LEFT ureteropelvic junction.

## 2017-11-10 DIAGNOSIS — G4733 Obstructive sleep apnea (adult) (pediatric): Secondary | ICD-10-CM | POA: Diagnosis not present

## 2017-11-12 IMAGING — CR DG ABDOMEN 1V
1 series · 2 of 2 positions shown · non-contrast
Comparison: 12/16/2015 CT

CLINICAL DATA: Left flank pain, known left UPJ stone from
12/16/2015 CT. Appendectomy.

EXAM:
ABDOMEN - 1 VIEW

[Series 1: dg abd 1 view · 0.14mm/px · 2 of 2 slices shown]
[im 1/2]
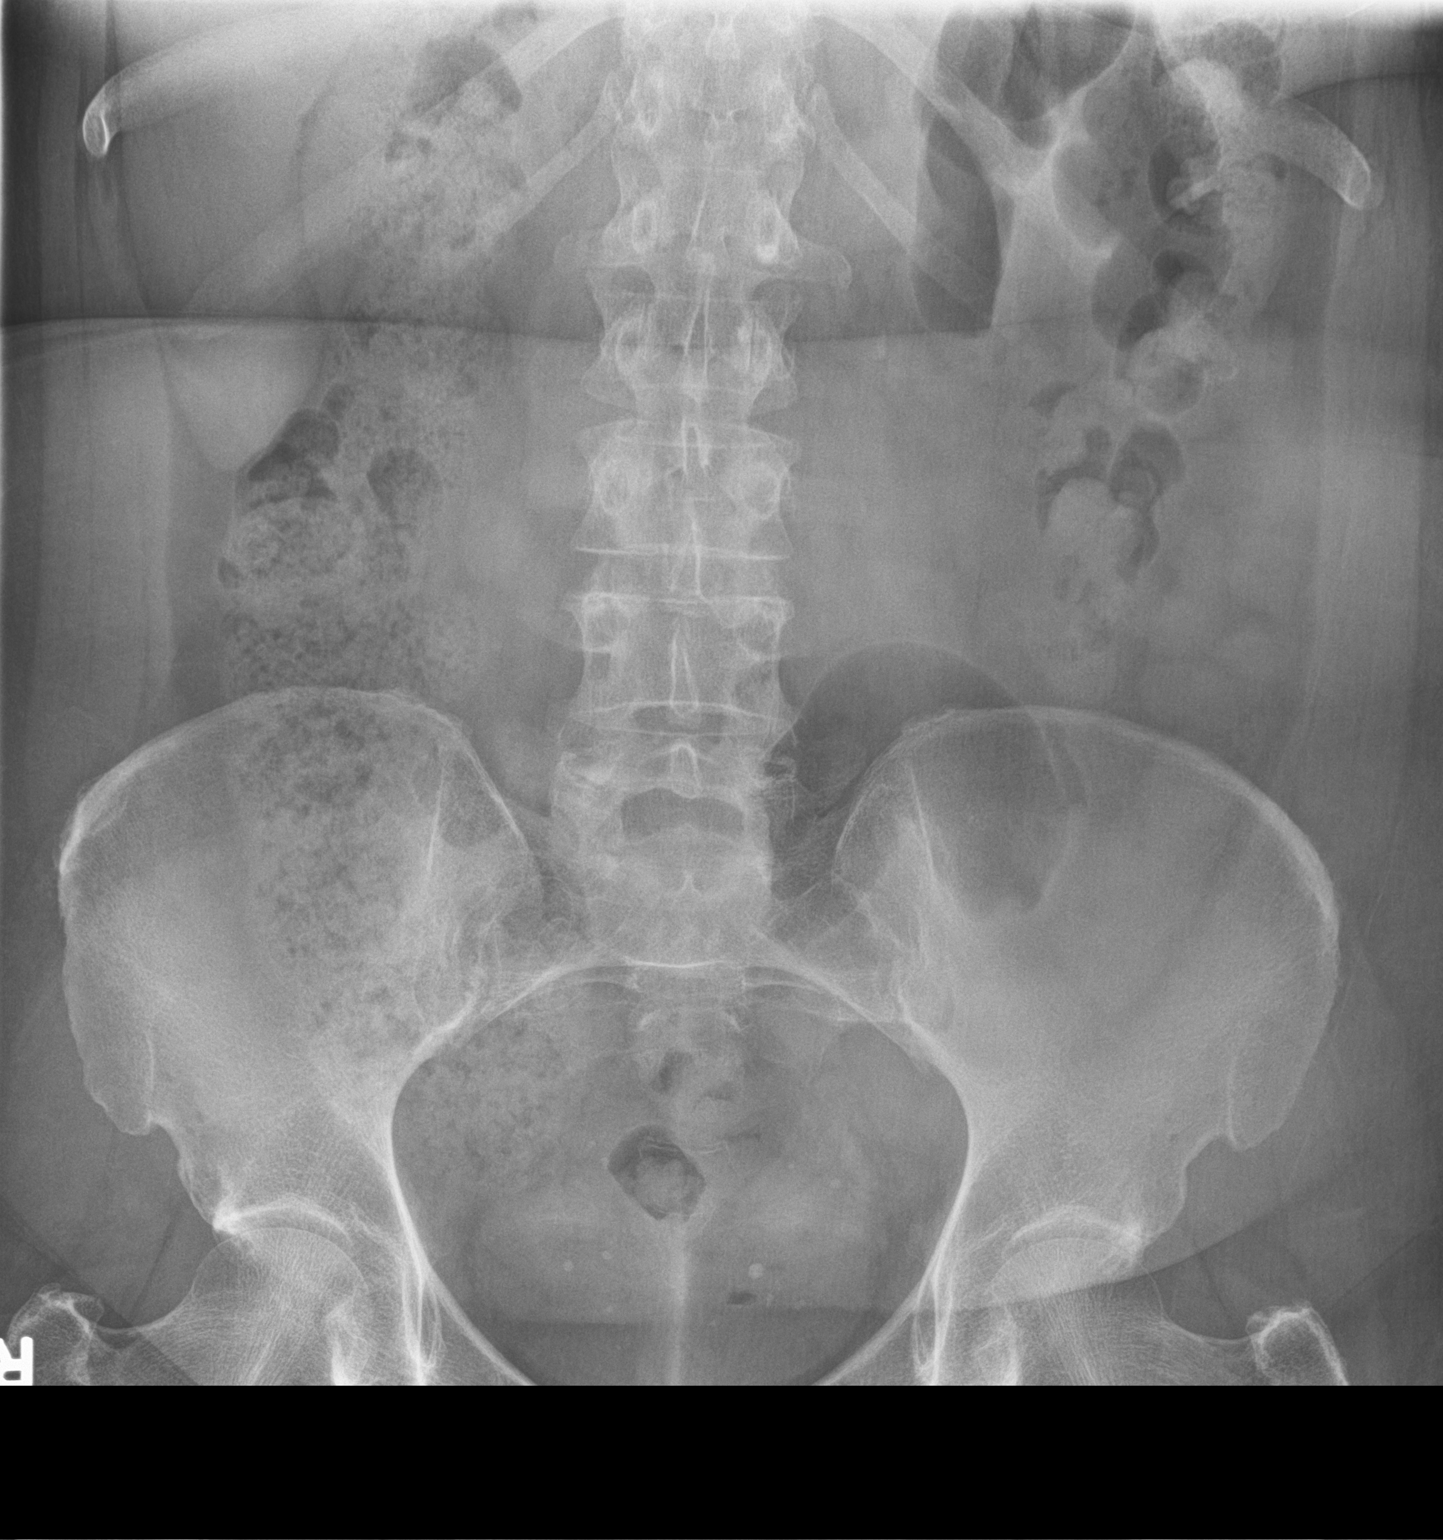
[im 2/2]
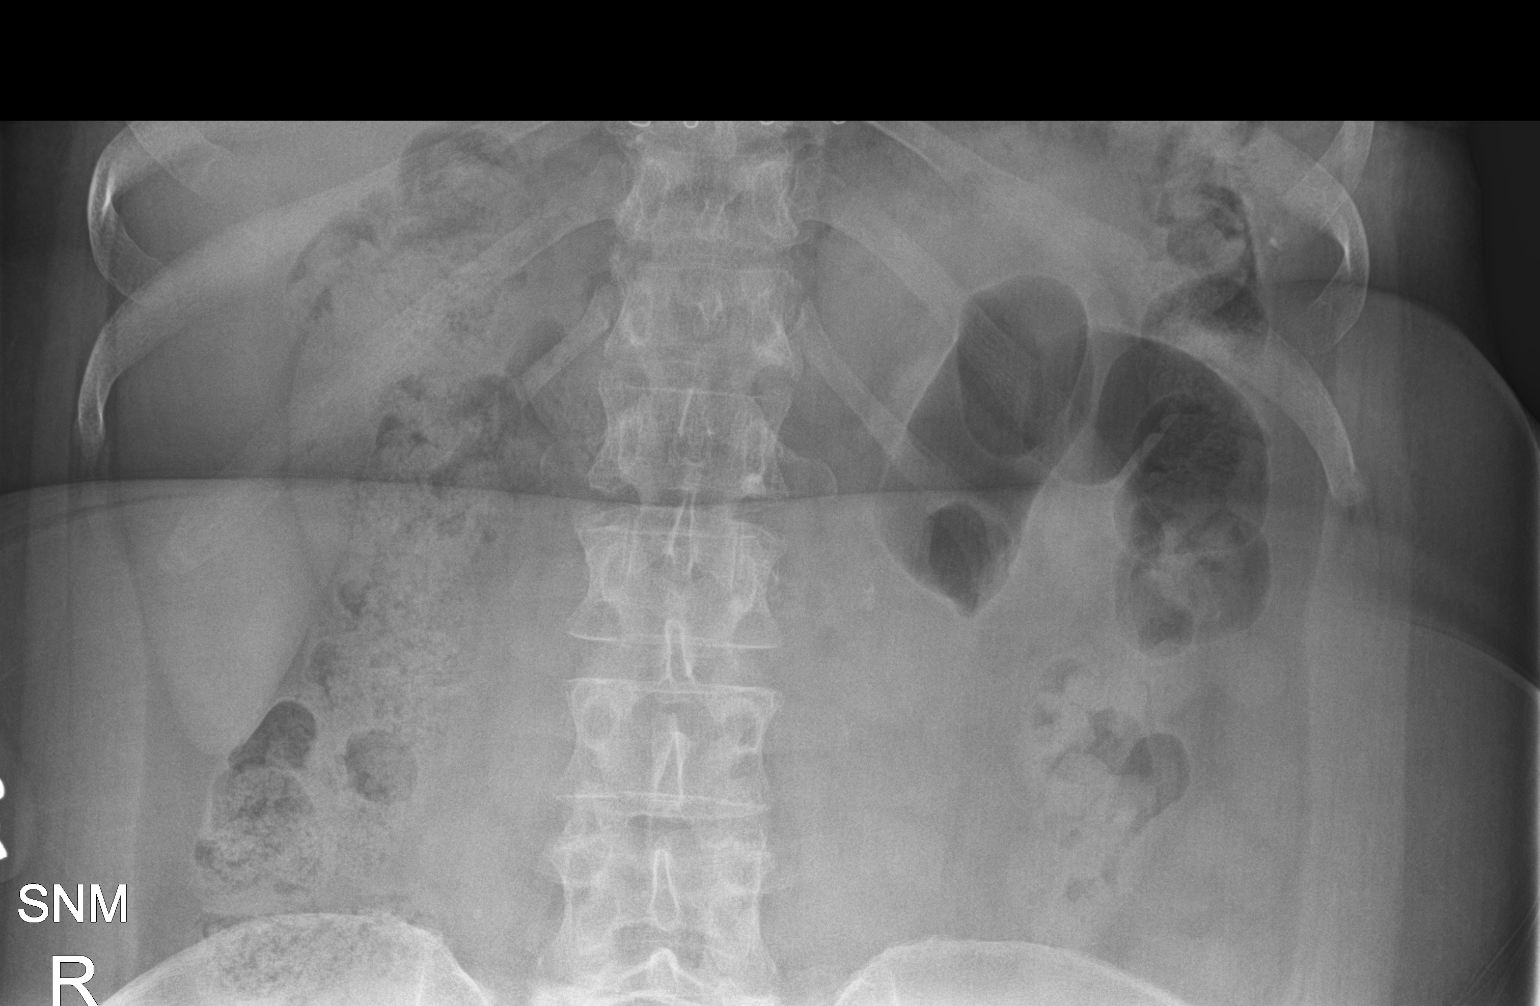

[2 of 2 positions shown; findings below may reference images not displayed]

FINDINGS: The bowel gas pattern is normal. 6 x 3 mm calcification noted in the
left hemi abdomen at the level of the left L2 transverse process
consistent with known UPJ stone. Numerous calcifications are
identified within the pelvis bilaterally most likely related to
phleboliths. Moderate colonic stool burden along the right colon and
to a lesser degree, the proximal descending colon. No bowel
obstruction. No free air. No acute osseous abnormality.
IMPRESSION: 6 x 3 mm calcification in the left hemiabdomen at the level of the
L2 left transverse process consistent with known left UPJ stone. No
significant change in position.

## 2017-11-13 LAB — COMPREHENSIVE METABOLIC PANEL
ALT: 25 IU/L (ref 0–32)
AST: 23 IU/L (ref 0–40)
Albumin/Globulin Ratio: 1.5 (ref 1.2–2.2)
Albumin: 4 g/dL (ref 3.5–5.5)
Alkaline Phosphatase: 116 IU/L (ref 39–117)
BUN/Creatinine Ratio: 22 (ref 9–23)
BUN: 19 mg/dL (ref 6–24)
Bilirubin Total: 0.3 mg/dL (ref 0.0–1.2)
CO2: 23 mmol/L (ref 20–29)
Calcium: 8.9 mg/dL (ref 8.7–10.2)
Chloride: 108 mmol/L — ABNORMAL HIGH (ref 96–106)
Creatinine, Ser: 0.87 mg/dL (ref 0.57–1.00)
GFR calc Af Amer: 86 mL/min/{1.73_m2} (ref >59)
GFR calc non Af Amer: 75 mL/min/{1.73_m2} (ref >59)
Globulin, Total: 2.6 g/dL (ref 1.5–4.5)
Glucose: 98 mg/dL (ref 65–99)
Potassium: 4 mmol/L (ref 3.5–5.2)
Sodium: 145 mmol/L — ABNORMAL HIGH (ref 134–144)
Total Protein: 6.6 g/dL (ref 6.0–8.5)

## 2017-11-13 LAB — CBC
Hematocrit: 41.6 % (ref 34.0–46.6)
Hemoglobin: 14.3 g/dL (ref 11.1–15.9)
MCH: 30.8 pg (ref 26.6–33.0)
MCHC: 34.4 g/dL (ref 31.5–35.7)
MCV: 90 fL (ref 79–97)
Platelets: 201 10*3/uL (ref 150–450)
RBC: 4.64 x10E6/uL (ref 3.77–5.28)
RDW: 14.3 % (ref 12.3–15.4)
WBC: 6.1 10*3/uL (ref 3.4–10.8)

## 2017-11-13 LAB — PROTIME-INR
INR: 1 (ref 0.8–1.2)
Prothrombin Time: 10.4 s (ref 9.1–12.0)

## 2017-11-17 ENCOUNTER — Encounter

## 2017-11-17 ENCOUNTER — Ambulatory Visit: Payer: BLUE CROSS/BLUE SHIELD | Admitting: Gastroenterology

## 2017-11-17 ENCOUNTER — Other Ambulatory Visit: Payer: Self-pay

## 2017-11-17 ENCOUNTER — Ambulatory Visit (INDEPENDENT_AMBULATORY_CARE_PROVIDER_SITE_OTHER): Payer: BLUE CROSS/BLUE SHIELD | Admitting: Gastroenterology

## 2017-11-17 ENCOUNTER — Encounter: Payer: Self-pay | Admitting: Gastroenterology

## 2017-11-17 VITALS — BP 117/75 | HR 66 | Ht 62.0 in | Wt 198.6 lb

## 2017-11-17 DIAGNOSIS — R748 Abnormal levels of other serum enzymes: Secondary | ICD-10-CM

## 2017-11-17 NOTE — Progress Notes (Addendum)
Vonda Antigua, MD [address]  [place]  [place], [place] [address]  Main: [phone]  Fax: [phone]   Primary Care Physician: [name], MD  Primary Gastroenterologist:  Dr. Vonda Antigua  Chief Complaint  Patient presents with  . Follow-up    elevated liver enzymes    HPI: [place] is a 57 y.o. female here for follow-up of elevated liver enzymes.  Patient was initially seen in June 2019 at which time she was referred from the [facility] for elevated liver enzymes.  I have obtained a new blood work for her on November 12, 2017, which shows normal liver enzymes, normal pulse, normal INR, therefore no evidence of liver dysfunction. The patient denies abdominal or flank pain, anorexia, nausea or vomiting, dysphagia, change in bowel habits or black or bloody stools or weight loss.  We had sent to release of information request to [facility] in [place] to obtain her previous liver biopsy results from 2010, and her colonoscopy report from [facility] but we have not received these.  Previous history: Records sent from the [place], only includes one rheumatologist clinic note, that also notes that liver enzymes are now normal.  March 2019 showed normal ALT and AST.  Total bilirubin and alk phos is not available in the record.  No other previous records available.  Patient states she used to live in [place], and had liver biopsy done at [facility] in 2010.  She does not know the results, we do not have the records.  Has history of lap scopic gastric sleeve,  in May 2018.  Denies any alcohol use.  Denies any recent hepatotoxic drugs, including no herbal supplements, weight loss products or green tea.   Patient reports history of a colonoscopy done 1 to 2 years ago at [facility].  States had 2 colonoscopies at [facility] in the past as well.  No reports available.  Current Outpatient Medications  Medication Sig Dispense Refill  . atorvastatin (LIPITOR) 20  MG tablet Take 20 mg by mouth daily.    Marland Kitchen azithromycin (ZITHROMAX) 250 MG tablet azithromycin 250 mg tablet    . calcium carbonate 100 mg/ml SUSP Take by mouth.    . Carboxymethylcellulose Sodium (THERATEARS) 0.25 % SOLN Administer 2 drops to both eyes daily as needed.    . cetirizine (ZYRTEC) 10 MG tablet Take by mouth.    . clobetasol ointment (TEMOVATE) 0.05 % clobetasol 0.05 % topical ointment    . cycloSPORINE (RESTASIS MULTIDOSE) 0.05 % ophthalmic emulsion Administer 2 drops to both eyes every morning.    Marland Kitchen doxycycline (MONODOX) 100 MG capsule Take by mouth.    . doxycycline (VIBRA-TABS) 100 MG tablet doxycycline hyclate 100 mg tablet    . DULoxetine (CYMBALTA) 20 MG capsule Take by mouth.    . eletriptan (RELPAX) 40 MG tablet Take by mouth.    . enoxaparin (LOVENOX) 40 MG/0.4ML injection Inject into the skin.    . fluticasone (FLONASE) 50 MCG/ACT nasal spray fluticasone propionate 50 mcg/actuation nasal spray,suspension    . guaiFENesin-codeine 100-10 MG/5ML syrup Take by mouth.    . hydrocortisone 2.5 % cream Apply topically.    . hydrOXYzine (ATARAX/VISTARIL) 10 MG tablet Take 20 mg by mouth 4 (four) times daily as needed.    Marland Kitchen levothyroxine (SYNTHROID, LEVOTHROID) 100 MCG tablet Take 175 mcg by mouth daily before breakfast.     . loratadine (CLARITIN) 10 MG tablet loratadine 10 mg tablet   1 tablet every day by oral  route.    . methotrexate (RHEUMATREX) 2.5 MG tablet Take by mouth.    . mycophenolate (CELLCEPT) 500 MG tablet Take 1,000 mg by mouth daily.    . mycophenolate (CELLCEPT) 500 MG tablet Take 500 mg by mouth at bedtime.    . NEOMYCIN-POLYMYXIN-HYDROCORTISONE (CORTISPORIN) 1 % SOLN OTIC solution Apply 1-2 drops to toe BID after soaking 10 mL 1  . omeprazole (PRILOSEC) 40 MG capsule omeprazole 40 mg capsule,delayed release    . ondansetron (ZOFRAN ODT) 4 MG disintegrating tablet Take 1 tablet (4 mg total) by mouth every 8 (eight) hours as needed for nausea or vomiting. 20  tablet 0  . oxyCODONE-acetaminophen (ROXICET) 5-325 MG tablet Take 1 tablet by mouth every 4 (four) hours as needed for severe pain. 30 tablet 0  . predniSONE (DELTASONE) 10 MG tablet Take by mouth.    . rizatriptan (MAXALT) 10 MG tablet Take by mouth.    . tamsulosin (FLOMAX) 0.4 MG CAPS capsule Take 1 capsule (0.4 mg total) by mouth daily. 7 capsule 0   No current facility-administered medications for this visit.     Allergies as of 11/17/2017 - Review Complete 07/21/2017  Allergen Reaction Noted  . Plaquenil  [hydroxychloroquine sulfate] Rash 02/03/2010  . Lactose intolerance (gi) Diarrhea 07/07/2017  . Omega-3 Rash 06/02/2016  . Plaquenil [hydroxychloroquine] Rash 12/16/2015    ROS:  General: Negative for anorexia, weight loss, fever, chills, fatigue, weakness. ENT: Negative for hoarseness, difficulty swallowing , nasal congestion. CV: Negative for chest pain, angina, palpitations, dyspnea on exertion, peripheral edema.  Respiratory: Negative for dyspnea at rest, dyspnea on exertion, cough, sputum, wheezing.  GI: See history of present illness. GU:  Negative for dysuria, hematuria, urinary incontinence, urinary frequency, nocturnal urination.  Endo: Negative for unusual weight change.    Physical Examination:   There were no vitals taken for this visit.  General: Well-nourished, well-developed in no acute distress.  Eyes: No icterus. Conjunctivae pink. Mouth: Oropharyngeal mucosa moist and pink , no lesions erythema or exudate. Neck: Supple, Trachea midline Abdomen: Bowel sounds are normal, nontender, nondistended, no hepatosplenomegaly or masses, no abdominal bruits or hernia , no rebound or guarding.   Extremities: No lower extremity edema. No clubbing or deformities. Neuro: Alert and oriented x 3.  Grossly intact. Skin: Warm and dry, no jaundice.   Psych: Alert and cooperative, normal mood and affect.   Labs: CMP     Component Value Date/Time   NA 145 (H)  11/12/2017 1353   K 4.0 11/12/2017 1353   CL 108 (H) 11/12/2017 1353   CO2 23 11/12/2017 1353   GLUCOSE 98 11/12/2017 1353   GLUCOSE 144 (H) 12/16/2015 2016   BUN 19 11/12/2017 1353   CREATININE 0.87 11/12/2017 1353   CALCIUM 8.9 11/12/2017 1353   PROT 6.6 11/12/2017 1353   ALBUMIN 4.0 11/12/2017 1353   AST 23 11/12/2017 1353   ALT 25 11/12/2017 1353   ALKPHOS 116 11/12/2017 1353   BILITOT 0.3 11/12/2017 1353   GFRNONAA 75 11/12/2017 1353   GFRAA 86 11/12/2017 1353   Lab Results  Component Value Date   WBC 6.1 11/12/2017   HGB 14.3 11/12/2017   HCT 41.6 11/12/2017   MCV 90 11/12/2017   PLT 201 11/12/2017    Imaging Studies: No results found.  Assessment and Plan:   Hel[place] Freeman is a 57 y.o. y/o female here for follow-up was referral placed for reportedly elevated liver enzymes, but work-up so far has showed normal liver enzymes, with  history of gastric sleeve in May 2018 for weight loss  I do not have any previous records that show her liver enzymes to be elevated, as of March 2019 AST and ALT sent from results were reviewed was normal October 2019 labs that I ordered showed normal liver enzymes as well  I suspect that she may have had elevated liver enzymes in the past that may have normalized after weight loss from her gastric sleeve. Given completely normal liver enzymes, further liver serology not necessary at this time Will obtain right upper quadrant ultrasound just to evaluate the liver  Patient educated about fatty liver, and encouraged to eat healthy, and that her weight loss from her surgery may have led to reversal of fatty liver and to avoid excessive weight gain to avoid fatty liver in the future and she verbalized understanding.  However, I have also discussed the importance of obtaining her previous work-up to ensure there are no chronic problems that we need to follow-up on.  We will send release of information request again to [facility] to obtain her  previous liver biopsy results.  However, I have asked her to call [facility] herself and asked to do records, requested they be sent to [place].  She states she is going to [place] in a month and will look for her records as well.  Addendum: After reviewing her previous records, which I have briefed below It appears that her previous liver biopsy was done at a time when her liver enzymes were elevated in the setting of rheumatology medication use, and liver biopsy was normal as per the rheumatology note. Therefore, she likely has had transient elevation in liver enzymes from medications in the past, 2011-2013  More recent elevation may have been prior to her weight loss.  Liver enzymes are now normal.  Follow-up in clinic in 6 to 12 months and liver enzymes can be repeated at that time. If liver enzymes are noted to be abnormal in the interim, primary care provider can feel free to notify us.  (Addendum: Previous records obtained Liver biopsy report, sep 2012 "Essentially unremarkable hepatic parenchyma "Clinical information: Hashimoto's and abnormal liver enzymes.   November 2018 labs AST 63, ALT 135, alk phos 132, bilirubin normal at 0.08 October 2016 labs Negative: anti-RNP, anti-SCL 70, Smith antibody, anti-SSB, anticentromere, anti-double-stranded DNA, anti-SSA, Smith RNP antibody, anti-Jo 1, anti-chromatin, anti-ribosomal Positive: ANA screen, ANA speckled 1:160  AST 99, ALT 224  July 2018 labs AST 28, ALT 84, alk phos 120, total bilirubin normal 0.07 April 2016 labs AST 15, ALT 20, alk phos 95, total bilirubin 0.6 -all normal  August 2017 labs AST 14, ALT 29, alk phos elevated to 145, normal bilirubin at 0.08 July 2015 labs Alk phos elevated to 135, transaminases and bilirubin normal  February 2016 labs All liver enzymes including transaminases, bili alk phos normal Anti-SSA positive, ANA screen positive, ANA 1:40 All the other autoimmune antibodies mentioned in September 2018  labs as well were all negative  November 2015 labs, alk phos 133, other liver enzymes normal  June 2015 labs, alk phos 139 other liver enzymes normal  March 2015, alk phos 131, other liver enzymes normal)  She states she may have her colonoscopy records at home, and I have asked her to please bring these with her so we have them for our records to determine when her next screening or surveillance colonoscopy should be.  She verbalized understanding.  (May 2016 colonoscopy with one rectal  polyp removed May 2016 pathology report states rectal polyp, was hyperplastic. I do not have any other colonoscopy reports, but a GI follow-up note signed on January 2019 by Tarri Fuller states that a colonoscopy reminder was set to 2 years from January 2019.  Gastroenterology note from May 2017 from the [place] states "She was seen in February for functional diarrhea.  Metamucil on prophylactic Imodium was recommended.  She is doing better...  Assessment: Functional diarrhea and fecal urgency-improved Flatulence-worse on Metamucil Lactose intolerance  Substitute FiberCon 2 tabs daily in place of Metamucil Dietary factors associated stockings were discussed Follow-up 6 months  -----------------------  March 2013 rheumatology note from [facility] "Ms. 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 additionally she has been noted to have LFT abnormalities noted.she has seen Dr. Thayer Jew regarding liver function tests.  They are normalized without intervention.  Then started Imuran and felt well.  However developed LFT abnormality again with AST, ALT in 3 times normal range.  She was seen by Dr. Dwyane Dee who performed a liver biopsy and this was normal.  Other notes sent from [facility] are all rheumatology notes prior to March 2013, in March 2013 with the most recent note from there. March 2013 labs, with  mildly elevated alk phos to 113.  Normal transaminases and bilirubin.  TSH over 100, and free T4 0.43 in March 2013 labs as well.  August 2012, AST 130, ALT 140, alk phos normal 106, T bili normal.  January 2012, alk phos 330, AST 54, ALT 168, normal bilirubin, GGT 205.  December 2011, alk phos 335, AST 70, ALT 156, normal total bilirubin  Dr Vonda Antigua

## 2017-11-19 NOTE — Addendum Note (Signed)
Addended by: Earl Lagos on: 11/19/2017 12:02 PM   Modules accepted: Orders

## 2017-11-20 ENCOUNTER — Telehealth: Payer: Self-pay

## 2017-11-20 NOTE — Telephone Encounter (Signed)
Left message that RUQ [place] scheduled for 11/25/2017 at [place] at [facility] in [place]. Nothing to eat or drink after midnight.

## 2017-11-25 ENCOUNTER — Ambulatory Visit (HOSPITAL_COMMUNITY)
Admission: RE | Admit: 2017-11-25 | Discharge: 2017-11-25 | Disposition: A | Discharge status: Home / Self Care | Payer: BLUE CROSS/BLUE SHIELD | Source: Ambulatory Visit | Attending: Gastroenterology | Admitting: Gastroenterology

## 2017-11-25 DIAGNOSIS — K8020 Calculus of gallbladder without cholecystitis without obstruction: Secondary | ICD-10-CM | POA: Insufficient documentation

## 2017-11-25 DIAGNOSIS — R748 Abnormal levels of other serum enzymes: Secondary | ICD-10-CM | POA: Insufficient documentation

## 2017-11-26 ENCOUNTER — Ambulatory Visit: Payer: BLUE CROSS/BLUE SHIELD | Admitting: Gastroenterology

## 2017-11-26 IMAGING — CR DG ABDOMEN 1V
1 series · 2 of 2 positions shown · non-contrast
Comparison: 12/19/2015

CLINICAL DATA: Recent lithotripsy for left-sided stone. Left-sided
discomfort.

EXAM:
ABDOMEN - 1 VIEW

[Series 1: dg abd 1 view · 0.14mm/px · 2 of 2 slices shown]
[im 1/2]
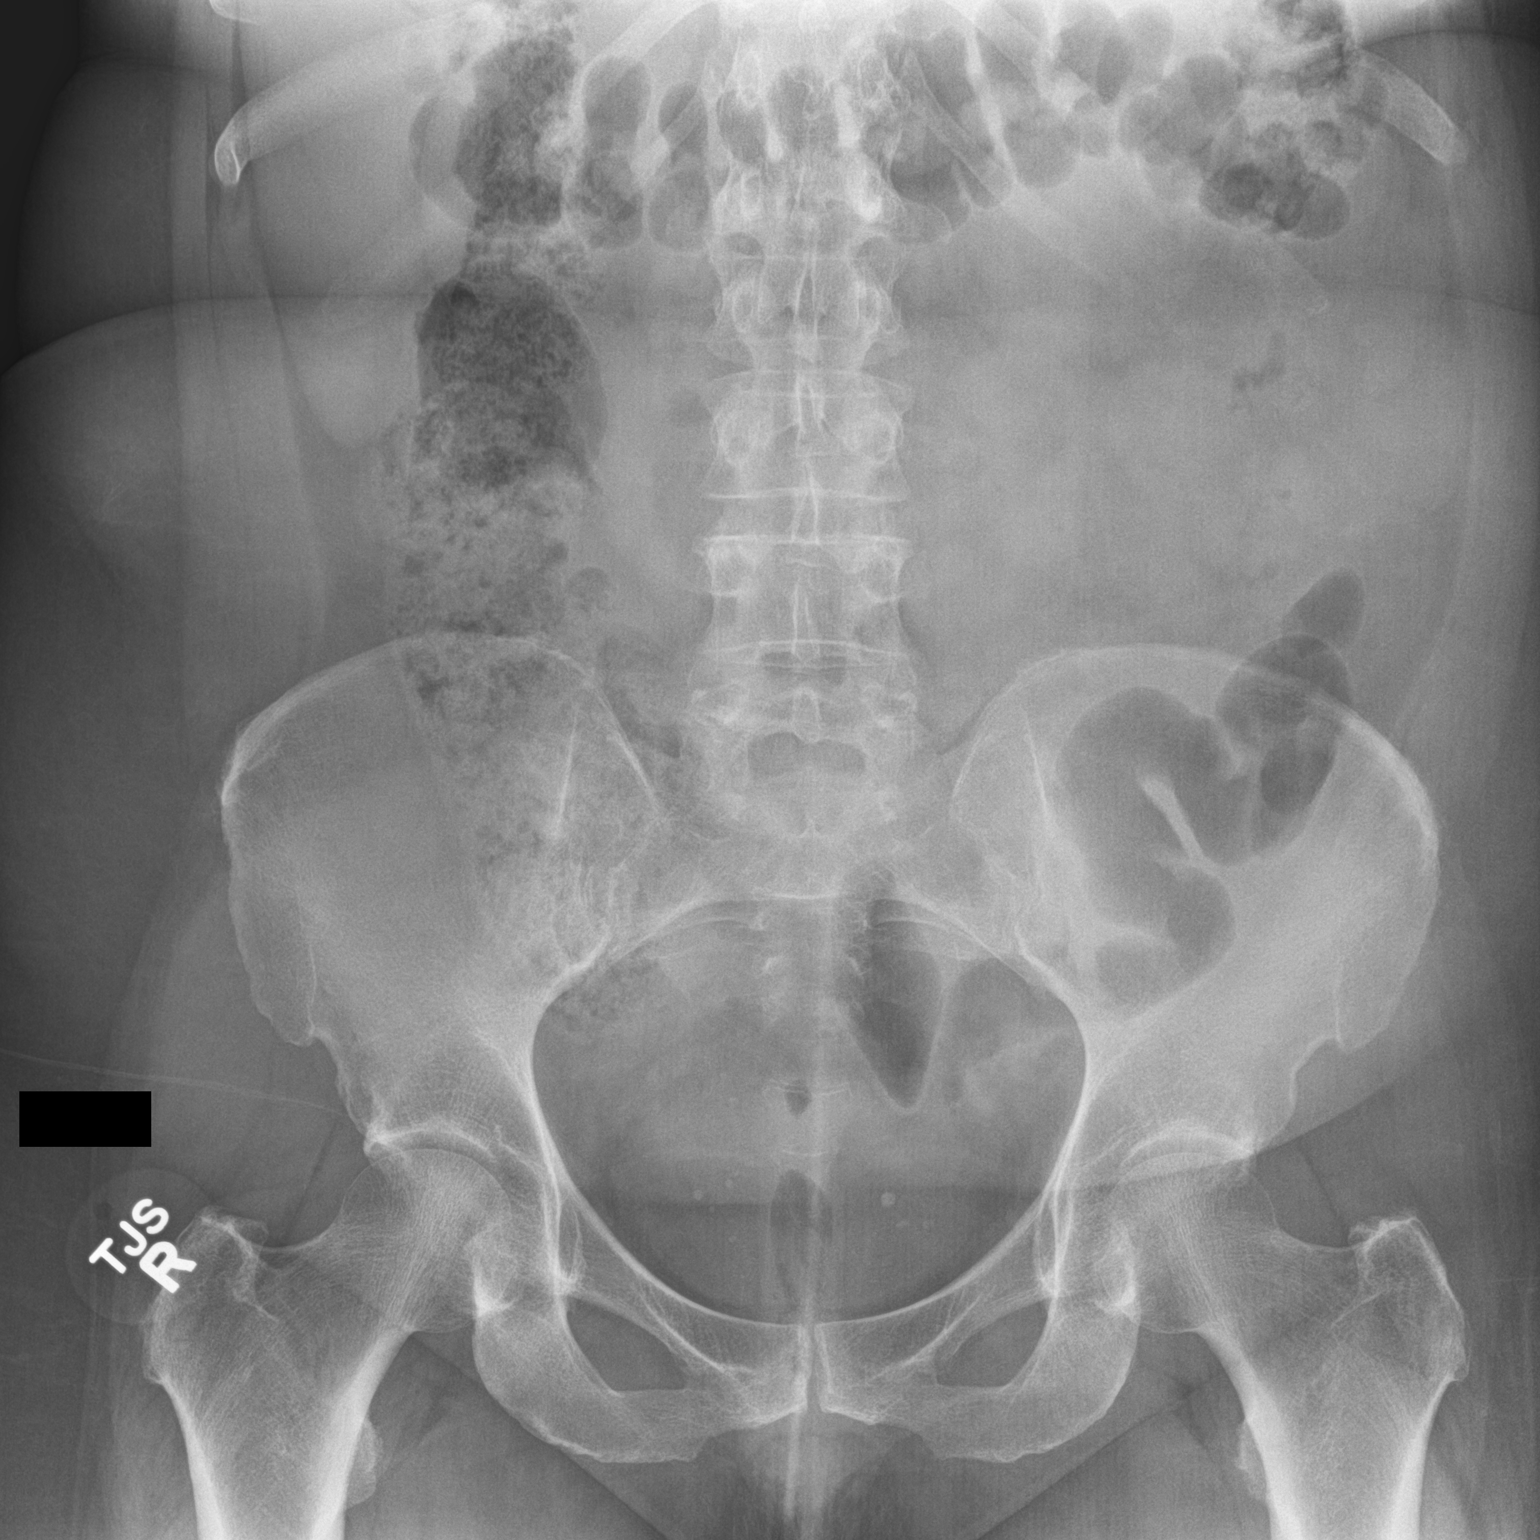
[im 2/2]
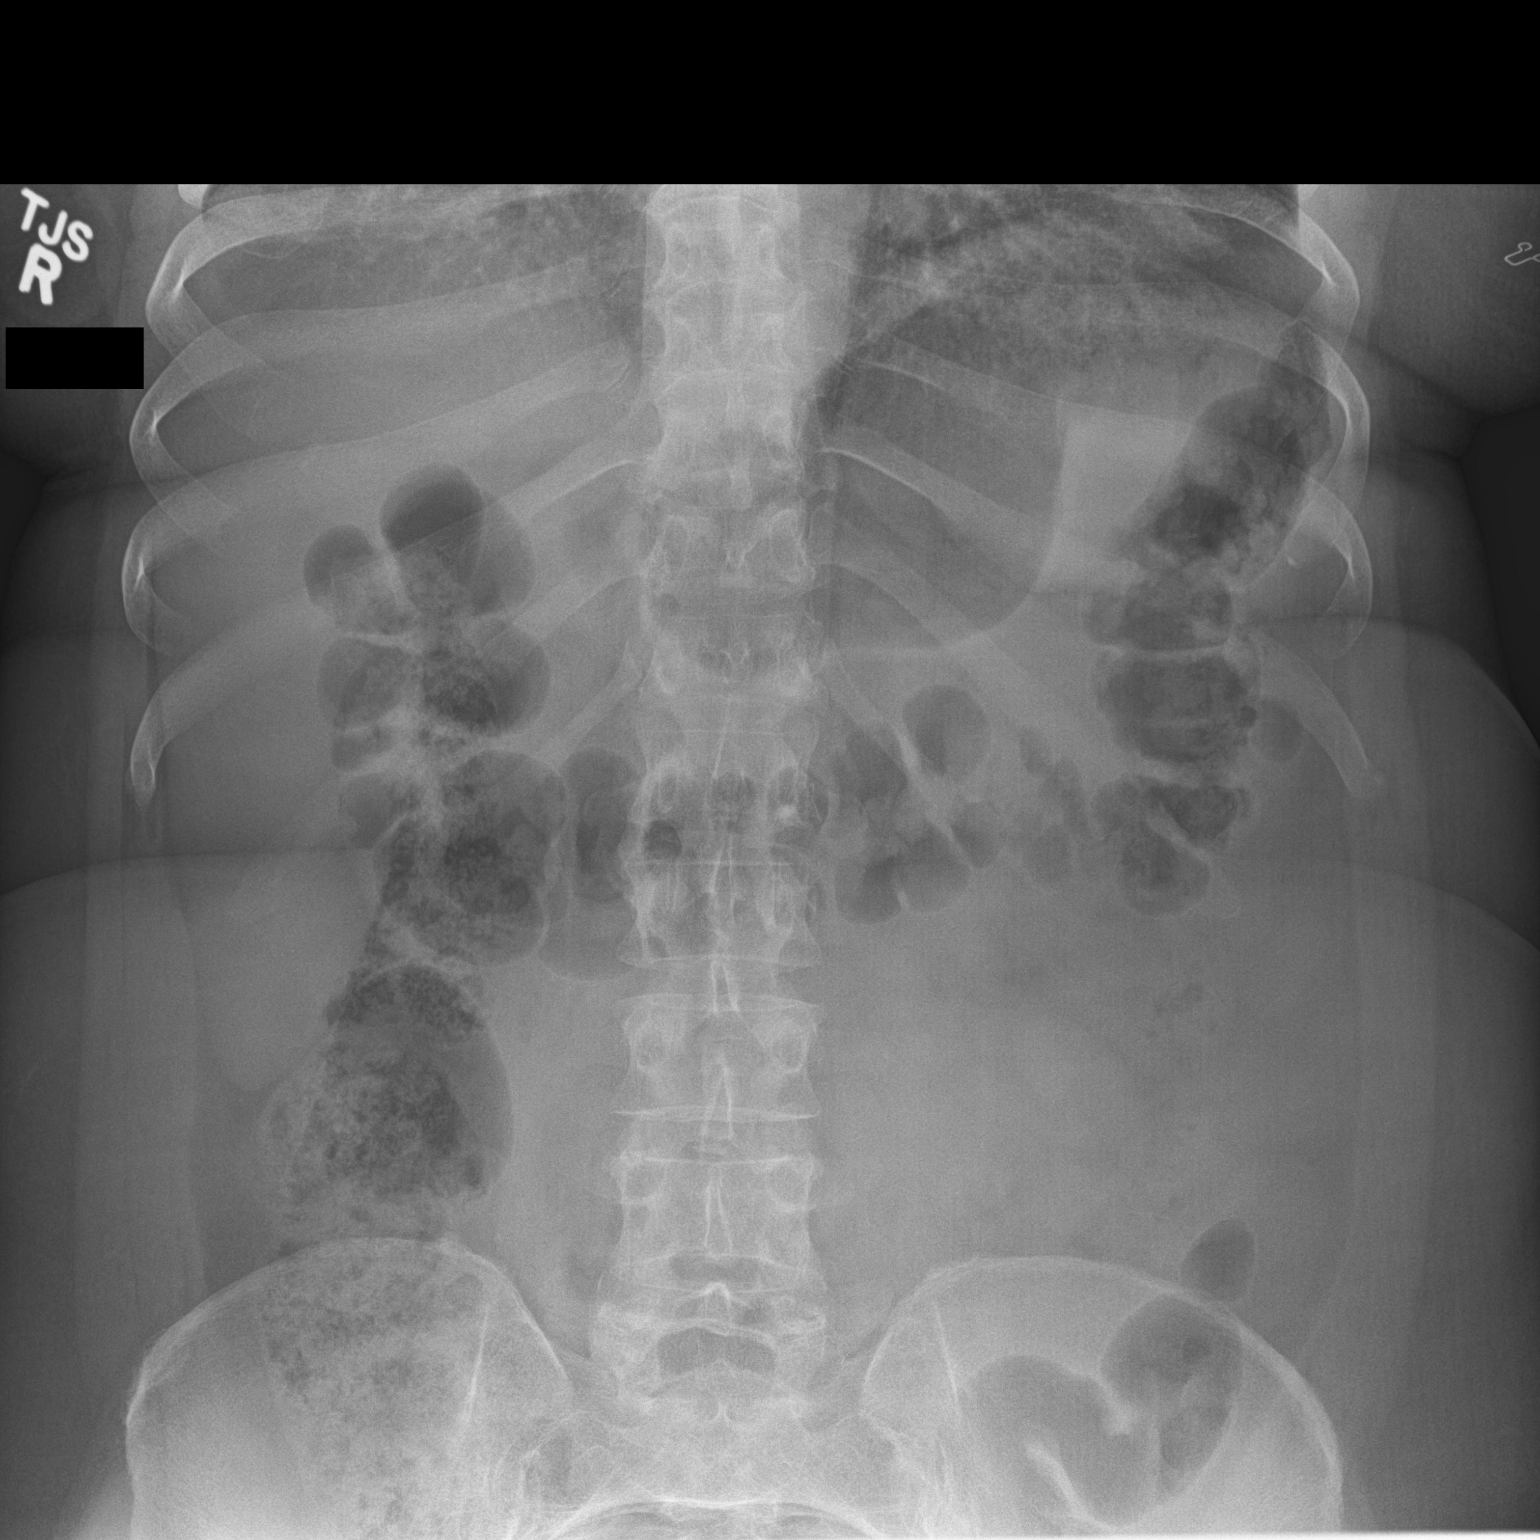

[2 of 2 positions shown; findings below may reference images not displayed]

FINDINGS: No evidence of urinary tract calcification on today's exam.
Phleboliths present in the pelvis. Gas pattern is normal. Bone
structures are normal.
IMPRESSION: No urinary tract calcification demonstrated on today's exam.

## 2017-12-10 ENCOUNTER — Encounter

## 2017-12-10 ENCOUNTER — Encounter: Payer: Self-pay | Admitting: Internal Medicine

## 2017-12-10 ENCOUNTER — Ambulatory Visit (INDEPENDENT_AMBULATORY_CARE_PROVIDER_SITE_OTHER): Payer: BLUE CROSS/BLUE SHIELD | Admitting: Internal Medicine

## 2017-12-10 VITALS — BP 122/78 | HR 70 | Temp 97.6°F | Resp 15 | Ht 61.0 in | Wt 194.5 lb

## 2017-12-10 DIAGNOSIS — M797 Fibromyalgia: Secondary | ICD-10-CM | POA: Insufficient documentation

## 2017-12-10 DIAGNOSIS — E039 Hypothyroidism, unspecified: Secondary | ICD-10-CM | POA: Diagnosis not present

## 2017-12-10 DIAGNOSIS — Z87898 Personal history of other specified conditions: Secondary | ICD-10-CM

## 2017-12-10 DIAGNOSIS — E049 Nontoxic goiter, unspecified: Secondary | ICD-10-CM

## 2017-12-10 DIAGNOSIS — F329 Major depressive disorder, single episode, unspecified: Secondary | ICD-10-CM

## 2017-12-10 DIAGNOSIS — L564 Polymorphous light eruption: Secondary | ICD-10-CM

## 2017-12-10 DIAGNOSIS — Z9889 Other specified postprocedural states: Secondary | ICD-10-CM

## 2017-12-10 DIAGNOSIS — Z23 Encounter for immunization: Secondary | ICD-10-CM | POA: Diagnosis not present

## 2017-12-10 DIAGNOSIS — R1013 Epigastric pain: Secondary | ICD-10-CM

## 2017-12-10 DIAGNOSIS — F419 Anxiety disorder, unspecified: Secondary | ICD-10-CM

## 2017-12-10 DIAGNOSIS — F32A Depression, unspecified: Secondary | ICD-10-CM

## 2017-12-10 DIAGNOSIS — G8929 Other chronic pain: Secondary | ICD-10-CM

## 2017-12-10 DIAGNOSIS — R7303 Prediabetes: Secondary | ICD-10-CM | POA: Insufficient documentation

## 2017-12-10 DIAGNOSIS — K2950 Unspecified chronic gastritis without bleeding: Secondary | ICD-10-CM | POA: Insufficient documentation

## 2017-12-10 DIAGNOSIS — L930 Discoid lupus erythematosus: Secondary | ICD-10-CM

## 2017-12-10 DIAGNOSIS — K8020 Calculus of gallbladder without cholecystitis without obstruction: Secondary | ICD-10-CM

## 2017-12-10 DIAGNOSIS — L309 Dermatitis, unspecified: Secondary | ICD-10-CM

## 2017-12-10 DIAGNOSIS — Z87442 Personal history of urinary calculi: Secondary | ICD-10-CM

## 2017-12-10 MED ORDER — PANTOPRAZOLE SODIUM 40 MG PO TBEC: 40.0000 mg | DELAYED_RELEASE_TABLET | Freq: Every day | ORAL | 0 refills | Status: DC

## 2017-12-10 NOTE — Patient Instructions (Addendum)
Dr. Joana Reamer rheumatology   Start protonix before lunch 30 minutes   Gastritis, Adult Gastritis is inflammation of the stomach. There are two kinds of gastritis:  Acute gastritis. This kind develops suddenly.  Chronic gastritis. This kind lasts for a long time.  Gastritis happens when the lining of the stomach becomes weak or gets damaged. Without treatment, gastritis can lead to stomach bleeding and ulcers. What are the causes? This condition may be caused by:  An infection.  Drinking too much alcohol.  Certain medicines.  Having too much acid in the stomach.  A disease of the intestines or stomach.  Stress.  What are the signs or symptoms? Symptoms of this condition include:  Pain or a burning in the upper abdomen.  Nausea.  Vomiting.  An uncomfortable feeling of fullness after eating.  In some cases, there are no symptoms. How is this diagnosed? This condition may be diagnosed with:  A description of your symptoms.  A physical exam.  Tests. These can include: ? Blood tests. ? Stool tests. ? A test in which a thin, flexible instrument with a light and camera on the end is passed down the esophagus and into the stomach (upper endoscopy). ? A test in which a sample of tissue is taken for testing (biopsy).  How is this treated? This condition may be treated with medicines. If the condition is caused by a bacterial infection, you may be given antibiotic medicines. If it is caused by too much acid in the stomach, you may get medicines called H2 blockers, proton pump inhibitors, or antacids. Treatment may also involve stopping the use of certain medicines, such as aspirin, ibuprofen, or other nonsteroidal anti-inflammatory drugs (NSAIDs). Follow these instructions at home:  Take over-the-counter and prescription medicines only as told by your health care provider.  If you were prescribed an antibiotic, take it as told by your health care provider. Do  not stop taking the antibiotic even if you start to feel better.  Drink enough fluid to keep your urine clear or pale yellow.  Eat small, frequent meals instead of large meals. Contact a health care provider if:  Your symptoms get worse.  Your symptoms return after treatment. Get help right away if:  You vomit blood or material that looks like coffee grounds.  You have black or dark red stools.  You are unable to keep fluids down.  Your abdominal pain gets worse.  You have a fever.  You do not feel better after 1 week. This information is not intended to replace advice given to you by your health care provider. Make sure you discuss any questions you have with your health care provider. Document Released: 01/14/2001 Document Revised: 09/19/2015 Document Reviewed: 10/14/2014 Elsevier Interactive Patient Education  2018 [employer].  Cholelithiasis Cholelithiasis is a form of gallbladder disease in which gallstones form in the gallbladder. The gallbladder is an organ that stores bile. Bile is made in the liver, and it helps to digest fats. Gallstones begin as small crystals and slowly grow into stones. They may cause no symptoms until the gallbladder tightens (contracts) and a gallstone is blocking the duct (gallbladder attack), which can cause pain. Cholelithiasis is also referred to as gallstones. There are two main types of gallstones:  Cholesterol stones. These are made of hardened cholesterol and are usually yellow-green in color. They are the most common type of gallstone. Cholesterol is a white, waxy, fat-like substance that is made in the liver.  Pigment stones. These  are dark in color and are made of a red-yellow substance that forms when hemoglobin from red blood cells breaks down (bilirubin).  What are the causes? This condition may be caused by an imbalance in the substances that bile is made of. This can happen if the bile:  Has too much bilirubin.  Has too much  cholesterol.  Does not have enough bile salts. These salts help the body absorb and digest fats.  In some cases, this condition can also be caused by the gallbladder not emptying completely or often enough. What increases the risk? The following factors may make you more likely to develop this condition:  Being female.  Having multiple pregnancies. Health care providers sometimes advise removing diseased gallbladders before future pregnancies.  Eating a diet that is heavy in fried foods, fat, and refined carbohydrates, like white bread and white rice.  Being obese.  Being older than age 50.  Prolonged use of medicines that contain female hormones (estrogen).  Having diabetes mellitus.  Rapidly losing weight.  Having a family history of gallstones.  Being of [place] or [place] descent.  Having an intestinal disease such as Crohn disease.  Having metabolic syndrome.  Having cirrhosis.  Having severe types of anemia such as sickle cell anemia.  What are the signs or symptoms? In most cases, there are no symptoms. These are known as silent gallstones. If a gallstone blocks the bile ducts, it can cause a gallbladder attack. The main symptom of a gallbladder attack is sudden pain in the upper right abdomen. The pain usually comes at night or after eating a large meal. The pain can last for one or several hours and can spread to the right shoulder or chest. If the bile duct is blocked for more than a few hours, it can cause infection or inflammation of the gallbladder, liver, or pancreas, which may cause:  Nausea.  Vomiting.  Abdominal pain that lasts for 5 hours or more.  Fever or chills.  Yellowing of the skin or the whites of the eyes (jaundice).  Dark urine.  Light-colored stools.  How is this diagnosed? This condition may be diagnosed based on:  A physical exam.  Your medical history.  An ultrasound of your gallbladder.  CT scan.  MRI.  Blood  tests to check for signs of infection or inflammation.  A scan of your gallbladder and bile ducts (biliary system) using nonharmful radioactive material and special cameras that can see the radioactive material (cholescintigram). This test checks to see how your gallbladder contracts and whether bile ducts are blocked.  Inserting a small tube with a camera on the end (endoscope) through your mouth to inspect bile ducts and check for blockages (endoscopic retrograde cholangiopancreatogram).  How is this treated? Treatment for gallstones depends on the severity of the condition. Silent gallstones do not need treatment. If the gallstones cause a gallbladder attack or other symptoms, treatment may be required. Options for treatment include:  Surgery to remove the gallbladder (cholecystectomy). This is the most common treatment.  Medicines to dissolve gallstones. These are most effective at treating small gallstones. You may need to take medicines for up to 6-12 months.  Shock wave treatment (extracorporeal biliary lithotripsy). In this treatment, an ultrasound machine sends shock waves to the gallbladder to break gallstones into smaller pieces. These pieces can then be passed into the intestines or be dissolved by medicine. This is rarely used.  Removing gallstones through endoscopic retrograde cholangiopancreatogram. A small basket can be attached  to the endoscope and used to capture and remove gallstones.  Follow these instructions at home:  Take over-the-counter and prescription medicines only as told by your health care provider.  Maintain a healthy weight and follow a healthy diet. This includes: ? Reducing fatty foods, such as fried food. ? Reducing refined carbohydrates, like white bread and white rice. ? Increasing fiber. Aim for foods like almonds, fruit, and beans.  Keep all follow-up visits as told by your health care provider. This is important. Contact a health care provider  if:  You think you have had a gallbladder attack.  You have been diagnosed with silent gallstones and you develop abdominal pain or indigestion. Get help right away if:  You have pain from a gallbladder attack that lasts for more than 2 hours.  You have abdominal pain that lasts for more than 5 hours.  You have a fever or chills.  You have persistent nausea and vomiting.  You develop jaundice.  You have dark urine or light-colored stools. Summary  Cholelithiasis (also called gallstones) is a form of gallbladder disease in which gallstones form in the gallbladder.  This condition is caused by an imbalance in the substances that make up bile. This can happen if the bile has too much cholesterol, too much bilirubin, or not enough bile salts.  You are more likely to develop this condition if you are female, pregnant, using medicines with estrogen, obese, older than age 61, or have a family history of gallstones. You may also develop gallstones if you have diabetes, an intestinal disease, cirrhosis, or metabolic syndrome.  Treatment for gallstones depends on the severity of the condition. Silent gallstones do not need treatment.  If gallstones cause a gallbladder attack or other symptoms, treatment may be needed. The most common treatment is surgery to remove the gallbladder. This information is not intended to replace advice given to you by your health care provider. Make sure you discuss any questions you have with your health care provider. Document Released: 01/16/2005 Document Revised: 10/07/2015 Document Reviewed: 10/07/2015 Elsevier Interactive Patient Education  2018 [employer].

## 2017-12-10 NOTE — Progress Notes (Addendum)
Chief Complaint  Patient presents with  . Establish Care   New patient  1. C/o epigastric abdominal pain w/o constipation, nausea/vomiting today s/p gastric sleeve 06/2016. [place] ab 11/25/17 +gallstones currently she does not want surgery referral. Denies blood in stool  2. Chronic pain established with pain clinic s/p SI jt and cervical radiofrequency procedure in [place].   Review of Systems  Constitutional: Negative for weight loss.  HENT: Negative for hearing loss.   Eyes: Negative for blurred vision.  Respiratory: Negative for shortness of breath.   Cardiovascular: Negative for chest pain.  Gastrointestinal: Positive for abdominal pain. Negative for blood in stool, nausea and vomiting.  Musculoskeletal:       +chronic pain   Skin: Negative for rash.  Neurological: Negative for headaches.  Psychiatric/Behavioral: Negative for depression.   Past Medical History:  Diagnosis Date  . Anxiety   . Arthritis   . Chronic gastritis    egd 04/18/16 + chronic gastritis and EGD 06/16/16 neg H pylori   . Depression   . Discoid lupus   . Eczema   . Fibroids   . Fibromyalgia   . Gallstones    11/25/17 [place]  . Hashimoto's disease   . Hyperlipemia   . Hypothyroidism   . Hypothyroidism   . Kidney stone    left UVJ 2017 CT scan   . Lateral epicondylitis   . Lupus ([place])   . [place]phea   . Morphea   . Plantar fasciitis   . PLE (polymorphic light eruption)   . Prediabetes   . Sleep apnea   . Urine incontinence    Past Surgical History:  Procedure Laterality Date  . ABDOMINAL HYSTERECTOMY     uterus and both ovaries out per pt cervix intact h/o PID in 1986, scar tissue, hemorrhagic cysts; multiple pelvic surgeries 1986 to 2006   . APPENDECTOMY    . cervical radiofrequency     in [place]   . EXTRACORPOREAL SHOCK WAVE LITHOTRIPSY Left 12/20/2015   Procedure: EXTRACORPOREAL SHOCK WAVE LITHOTRIPSY (ESWL);  Surgeon: Nickie Retort, MD;  Location: [facility] ORS;  Service: Urology;  Laterality: Left;  .  LAPAROSCOPIC GASTRIC SLEEVE RESECTION     06/2016 Dr. Darnell Level   . LAPAROSCOPIC TOTAL HYSTERECTOMY  2004  . OTHER SURGICAL HISTORY    . ROTATOR CUFF REPAIR Right    02/2004   Family History  Problem Relation Age of Onset  . Kidney cancer Maternal Grandmother   . Hypertension Maternal Grandmother   . Arthritis Mother   . Hyperlipidemia Mother   . Hypertension Mother   . Heart disease Father   . Early death Father   . Prostate cancer Neg Hx    Social History   Socioeconomic History  . Marital status: Married    Spouse name: Not on file  . Number of children: Not on file  . Years of education: Not on file  . Highest education level: Not on file  Occupational History  . Not on file  Social Needs  . Financial resource strain: Not on file  . Food insecurity:    Worry: Not on file    Inability: Not on file  . Transportation needs:    Medical: Not on file    Non-medical: Not on file  Tobacco Use  . Smoking status: Former Research scientist (life sciences)  . Smokeless tobacco: Never Used  Substance and Sexual Activity  . Alcohol use: Yes  . Drug use: No  . Sexual activity: Not Currently  Comment: female   Lifestyle  . Physical activity:    Days per week: Not on file    Minutes per session: Not on file  . Stress: Not on file  Relationships  . Social connections:    Talks on phone: Not on file    Gets together: Not on file    Attends religious service: Not on file    Active member of club or organization: Not on file    Attends meetings of clubs or organizations: Not on file    Relationship status: Not on file  . Intimate partner violence:    Fear of current or ex partner: Not on file    Emotionally abused: Not on file    Physically abused: Not on file    Forced sexual activity: Not on file  Other Topics Concern  . Not on file  Social History Narrative   [employer]    1 son    Married to wife    No guns, wears seat belt, safe in relationship    [place]   Current Meds  Medication Sig  . atorvastatin (LIPITOR) 20 MG tablet Take 20 mg by mouth daily.  . Carboxymethylcellulose Sodium (THERATEARS) 0.25 % SOLN Administer 2 drops to both eyes daily as needed.  . cetirizine (ZYRTEC) 10 MG tablet Take by mouth.  . clobetasol ointment (TEMOVATE) 0.05 % clobetasol 0.05 % topical ointment  . cyclobenzaprine (FLEXERIL) 5 MG tablet Take 5 mg by mouth 3 (three) times daily as needed for muscle spasms.  . cycloSPORINE (RESTASIS MULTIDOSE) 0.05 % ophthalmic emulsion Administer 2 drops to both eyes every morning.  . DULoxetine (CYMBALTA) 20 MG capsule Take by mouth.  . eletriptan (RELPAX) 40 MG tablet Take by mouth.  . fluticasone (FLONASE) 50 MCG/ACT nasal spray fluticasone propionate 50 mcg/actuation nasal spray,suspension  . hydrocortisone 2.5 % cream Apply topically.  . hydrOXYzine (ATARAX/VISTARIL) 10 MG tablet Take 20 mg by mouth 4 (four) times daily as needed.  Marland Kitchen levothyroxine (SYNTHROID, LEVOTHROID) 100 MCG tablet Take 175 mcg by mouth daily before breakfast.   . loratadine (CLARITIN) 10 MG tablet loratadine 10 mg tablet   1 tablet every day by oral route.  Marland Kitchen morphine (MSIR) 15 MG tablet Take 15 mg by mouth every 4 (four) hours as needed.   . mycophenolate (CELLCEPT) 500 MG tablet Take 1,000 mg by mouth daily.  . mycophenolate (CELLCEPT) 500 MG tablet Take 500 mg by mouth at bedtime.  Marland Kitchen oxyCODONE-acetaminophen (ROXICET) 5-325 MG tablet Take 1 tablet by mouth every 4 (four) hours as needed for severe pain.  . Quinacrine HCl POWD   . rizatriptan (MAXALT) 10 MG tablet Take by mouth.  . tamsulosin (FLOMAX) 0.4 MG CAPS capsule Take 1 capsule (0.4 mg total) by mouth daily.  Marland Kitchen topiramate (TOPAMAX) 25 MG capsule Take 25 mg by mouth 2 (two) times daily.   Allergies  Allergen Reactions  . Plaquenil  [Hydroxychloroquine Sulfate] Rash  . Lactose Intolerance (Gi) Diarrhea  . Omega-3 Rash    UV Rays gives her a rash  . Plaquenil [Hydroxychloroquine]  Rash   Recent Results (from the past 2160 hour(s))  Comprehensive metabolic panel     Status: Abnormal   Collection Time: 11/12/17  1:53 PM  Result Value Ref Range   Glucose 98 65 - 99 mg/dL   BUN 19 6 - 24 mg/dL   Creatinine, Ser 0.87 0.57 - 1.00 mg/dL   GFR calc non Af Wyvonnia Lora  75 >59 mL/min/1.73   GFR calc Af Amer 86 >59 mL/min/1.73   BUN/Creatinine Ratio 22 9 - 23   Sodium 145 (H) 134 - 144 mmol/L   Potassium 4.0 3.5 - 5.2 mmol/L   Chloride 108 (H) 96 - 106 mmol/L   CO2 23 20 - 29 mmol/L   Calcium 8.9 8.7 - 10.2 mg/dL   Total Protein 6.6 6.0 - 8.5 g/dL   Albumin 4.0 3.5 - 5.5 g/dL   Globulin, Total 2.6 1.5 - 4.5 g/dL   Albumin/Globulin Ratio 1.5 1.2 - 2.2   Bilirubin Total 0.3 0.0 - 1.2 mg/dL   Alkaline Phosphatase 116 39 - 117 IU/L   AST 23 0 - 40 IU/L   ALT 25 0 - 32 IU/L  CBC     Status: None   Collection Time: 11/12/17  1:54 PM  Result Value Ref Range   WBC 6.1 3.4 - 10.8 x10E3/uL   RBC 4.64 3.77 - 5.28 x10E6/uL   Hemoglobin 14.3 11.1 - 15.9 g/dL   Hematocrit 41.6 34.0 - 46.6 %   MCV 90 79 - 97 fL   MCH 30.8 26.6 - 33.0 pg   MCHC 34.4 31.5 - 35.7 g/dL   RDW 14.3 12.3 - 15.4 %   Platelets 201 150 - 450 x10E3/uL  Protime-INR     Status: None   Collection Time: 11/12/17  1:55 PM  Result Value Ref Range   INR 1.0 0.8 - 1.2    Comment: Reference interval is for non-anticoagulated patients. Suggested INR therapeutic range for Vitamin K antagonist therapy:    Standard Dose (moderate intensity                   therapeutic range):       2.0 - 3.0    Higher intensity therapeutic range       2.5 - 3.5    Prothrombin Time 10.4 9.1 - 12.0 sec   Objective  Body mass index is 36.75 kg/m. Wt Readings from Last 3 Encounters:  12/10/17 194 lb 8 oz (88.2 kg)  11/17/17 198 lb 9.6 oz (90.1 kg)  07/14/17 191 lb 9.6 oz (86.9 kg)   Temp Readings from Last 3 Encounters:  12/10/17 97.6 F (36.4 C) (Oral)  12/20/15 98.4 F (36.9 C) (Oral)  12/16/15 97.7 F (36.5 C) (Oral)    BP Readings from Last 3 Encounters:  12/10/17 122/78  11/17/17 117/75  07/14/17 119/80   Pulse Readings from Last 3 Encounters:  12/10/17 70  11/17/17 66  07/14/17 68    Physical Exam  Constitutional: She is oriented to person, place, and time. Vital signs are normal. She appears well-developed and well-nourished. She is cooperative.  HENT:  Head: Normocephalic and atraumatic.  Mouth/Throat: Oropharynx is clear and moist and mucous membranes are normal.  Eyes: Pupils are equal, round, and reactive to light. Conjunctivae are normal.  Cardiovascular: Normal rate, regular rhythm and normal heart sounds.  Pulmonary/Chest: Effort normal and breath sounds normal.  Abdominal: There is tenderness in the epigastric area. There is no rigidity, no rebound and no guarding.    Neurological: She is alert and oriented to person, place, and time. Gait normal.  Skin: Skin is warm, dry and intact.  Psychiatric: She has a normal mood and affect. Her speech is normal and behavior is normal. Judgment and thought content normal. Cognition and memory are normal.  Nursing note and vitals reviewed.   Assessment   1. Epigastric ab pain ddx chronic gastritis +  gallstones on [place] 11/25/17  2. Chronic pain  3. Hypothyroidism and thyroid goiter  4. HM Plan   1.  Pt declines surgery referral for gallstones for now when ready refer Dr. Starla Link. Burt Knack or possibly back to Dr. Darnell Level if he deals with GB issues  Add protonix 40 to take 30 minutes before lunch to see if helps 2. F/u pain clinic  3. Thyroid US  Check TSH  4.  Check fasting labs for work form BMET, A1C, lipid vitamin D, TSH, UA, MMR, hep B given [facility] form today will try to add on B12 and hep C.   Given high dose flu shot today  Tdap at next visit  Colonoscopy and pap get [place], [place] and pap [place] signed today  Mammogram pt will fax copy from [place]  S/p hysterectomy fibroids cervix possibly intact will need to do pelvic  and possible pap Former smoker quantify how much and long at f/u   Labs [facility] and fill out work form when done -add on hep C to labs [facility] and B12 to upcoming labs Hep B titer 8.7 and hec neg 12/25/17  Ask HIV in future married to wife    F/[facility] dermatology [facility] Tomasa Blase today consider in future for local MD Eye exam 02/2017   Former PCP [place].  Provider: Dr. Olivia Mackie [facility]

## 2017-12-17 ENCOUNTER — Ambulatory Visit
Admission: RE | Admit: 2017-12-17 | Discharge: 2017-12-17 | Disposition: A | Discharge status: Home / Self Care | Payer: BLUE CROSS/BLUE SHIELD | Source: Ambulatory Visit | Attending: Internal Medicine | Admitting: Internal Medicine

## 2017-12-17 DIAGNOSIS — E039 Hypothyroidism, unspecified: Secondary | ICD-10-CM | POA: Diagnosis not present

## 2017-12-17 DIAGNOSIS — E049 Nontoxic goiter, unspecified: Secondary | ICD-10-CM

## 2017-12-23 ENCOUNTER — Telehealth: Payer: Self-pay | Admitting: Internal Medicine

## 2017-12-23 NOTE — Telephone Encounter (Signed)
Call pt has she done labs yet with [facility]?   [place]

## 2017-12-24 NOTE — Telephone Encounter (Signed)
Patient has not. She left paperwork at home and thought they were in system, so was unable to have them done today.  Patient would like to have them placed in system.

## 2017-12-25 ENCOUNTER — Other Ambulatory Visit: Payer: Self-pay | Admitting: Internal Medicine

## 2017-12-25 DIAGNOSIS — Z1389 Encounter for screening for other disorder: Secondary | ICD-10-CM | POA: Diagnosis not present

## 2017-12-25 DIAGNOSIS — Z13818 Encounter for screening for other digestive system disorders: Secondary | ICD-10-CM | POA: Diagnosis not present

## 2017-12-25 DIAGNOSIS — E039 Hypothyroidism, unspecified: Secondary | ICD-10-CM | POA: Diagnosis not present

## 2017-12-25 DIAGNOSIS — Z13220 Encounter for screening for lipoid disorders: Secondary | ICD-10-CM | POA: Diagnosis not present

## 2017-12-25 DIAGNOSIS — R109 Unspecified abdominal pain: Secondary | ICD-10-CM | POA: Diagnosis not present

## 2017-12-25 DIAGNOSIS — Z1329 Encounter for screening for other suspected endocrine disorder: Secondary | ICD-10-CM | POA: Diagnosis not present

## 2017-12-25 DIAGNOSIS — E870 Hyperosmolality and hypernatremia: Secondary | ICD-10-CM | POA: Diagnosis not present

## 2017-12-25 DIAGNOSIS — R7303 Prediabetes: Secondary | ICD-10-CM | POA: Diagnosis not present

## 2017-12-25 DIAGNOSIS — Z1159 Encounter for screening for other viral diseases: Secondary | ICD-10-CM | POA: Diagnosis not present

## 2017-12-26 LAB — URINALYSIS, ROUTINE W REFLEX MICROSCOPIC
Bilirubin, UA: NEGATIVE
Glucose, UA: NEGATIVE
Ketones, UA: NEGATIVE
Leukocytes, UA: NEGATIVE
Nitrite, UA: NEGATIVE
Protein, UA: NEGATIVE
RBC, UA: NEGATIVE
Specific Gravity, UA: >=1.03 — AB (ref 1.005–1.030)
Urobilinogen, Ur: 0.2 mg/dL (ref 0.2–1.0)
pH, UA: 5.5 (ref 5.0–7.5)

## 2017-12-26 LAB — COMPREHENSIVE METABOLIC PANEL
ALT: 29 IU/L (ref 0–32)
AST: 18 IU/L (ref 0–40)
Albumin/Globulin Ratio: 1.5 (ref 1.2–2.2)
Albumin: 4.3 g/dL (ref 3.5–5.5)
Alkaline Phosphatase: 111 IU/L (ref 39–117)
BUN/Creatinine Ratio: 23 (ref 9–23)
BUN: 23 mg/dL (ref 6–24)
Bilirubin Total: 0.5 mg/dL (ref 0.0–1.2)
CO2: 22 mmol/L (ref 20–29)
Calcium: 9.4 mg/dL (ref 8.7–10.2)
Chloride: 106 mmol/L (ref 96–106)
Creatinine, Ser: 0.99 mg/dL (ref 0.57–1.00)
GFR calc Af Amer: 74 mL/min/{1.73_m2} (ref >59)
GFR calc non Af Amer: 64 mL/min/{1.73_m2} (ref >59)
Globulin, Total: 2.9 g/dL (ref 1.5–4.5)
Glucose: 108 mg/dL — ABNORMAL HIGH (ref 65–99)
Potassium: 3.9 mmol/L (ref 3.5–5.2)
Sodium: 144 mmol/L (ref 134–144)
Total Protein: 7.2 g/dL (ref 6.0–8.5)

## 2017-12-26 LAB — MEASLES/MUMPS/RUBELLA IMMUNITY
MUMPS ABS, IGG: >300 AU/mL (ref >10.9)
RUBEOLA AB, IGG: >300 AU/mL (ref >16.4)
Rubella Antibodies, IGG: >33 index (ref >0.99)

## 2017-12-26 LAB — LIPID PANEL W/O CHOL/HDL RATIO
Cholesterol, Total: 196 mg/dL (ref 100–199)
HDL: 78 mg/dL (ref >39)
LDL Calculated: 101 mg/dL — ABNORMAL HIGH (ref 0–99)
Triglycerides: 86 mg/dL (ref 0–149)
VLDL Cholesterol Cal: 17 mg/dL (ref 5–40)

## 2017-12-26 LAB — VITAMIN D 25 HYDROXY (VIT D DEFICIENCY, FRACTURES): Vit D, 25-Hydroxy: 38.2 ng/mL (ref 30.0–100.0)

## 2017-12-26 LAB — TSH: TSH: 1.35 u[IU]/mL (ref 0.450–4.500)

## 2017-12-26 LAB — HGB A1C W/O EAG: Hgb A1c MFr Bld: 5.9 % — ABNORMAL HIGH (ref 4.8–5.6)

## 2017-12-26 LAB — HEPATITIS B SURFACE ANTIBODY,QUALITATIVE: Hep B Surface Ab, Qual: REACTIVE

## 2017-12-28 ENCOUNTER — Other Ambulatory Visit: Payer: Self-pay | Admitting: Internal Medicine

## 2017-12-28 ENCOUNTER — Telehealth: Payer: Self-pay

## 2017-12-28 DIAGNOSIS — Z1159 Encounter for screening for other viral diseases: Secondary | ICD-10-CM

## 2017-12-28 DIAGNOSIS — Z13818 Encounter for screening for other digestive system disorders: Secondary | ICD-10-CM

## 2017-12-28 DIAGNOSIS — Z118 Encounter for screening for other infectious and parasitic diseases: Secondary | ICD-10-CM

## 2017-12-28 NOTE — Telephone Encounter (Signed)
Pt found [facility] form and had labs done   [place]

## 2017-12-28 NOTE — Telephone Encounter (Signed)
-----   Message from Virgel Manifold, MD sent at 12/17/2017  1:28 PM EST ----- Jackelyn Poling, please let the patient know we have received her [place] records.  I see that she had a colonoscopy there in May 2016.  Can you ask her if she had any other colonoscopies after that?  We still have not received the liver biopsy report from [place].  Can you see if the record release was sent for this, and we faxed that if not.  Please have her follow-up with me in clinic in 6 months

## 2017-12-28 NOTE — Telephone Encounter (Signed)
-----   Message from Virgel Manifold, MD sent at 12/17/2017  1:28 PM EST ----- Jackelyn Poling, please let the patient know we have received her [place] records.  I see that she had a colonoscopy there in May 2016.  Can you ask her if she had any other colonoscopies after that?  We still have not received the liver biopsy report from [place].  Can you see if the record release was sent for this, and we faxed that if not.  Please have her follow-up with me in clinic in 6 months

## 2017-12-28 NOTE — Telephone Encounter (Signed)
Left message that records were received from [place] and was May 2016 the last colonoscopy done. Also please contact office to schedule a f/u appt in  6 months. I have recent there request to [place] on 6/17, 10/16, and resent again on 12/28/17.

## 2017-12-28 NOTE — Telephone Encounter (Signed)
-----   Message from Virgel Manifold, MD sent at 12/17/2017  1:28 PM EST ----- Helen Freeman, please let the patient know we have received her [place] records.  I see that she had a colonoscopy there in May 2016.  Can you ask her if she had any other colonoscopies after that?  We still have not received the liver biopsy report from [place].  Can you see if the record release was sent for this, and we faxed that if not.  Please have her follow-up with me in clinic in 6 months

## 2017-12-29 LAB — SPECIMEN STATUS REPORT

## 2017-12-30 ENCOUNTER — Other Ambulatory Visit: Payer: Self-pay | Admitting: Internal Medicine

## 2017-12-30 DIAGNOSIS — E538 Deficiency of other specified B group vitamins: Secondary | ICD-10-CM

## 2017-12-30 LAB — HEPATITIS C ANTIBODY: Hep C Virus Ab: <0.1 s/co ratio (ref 0.0–0.9)

## 2017-12-30 LAB — SPECIMEN STATUS REPORT

## 2017-12-30 LAB — HEPATITIS B SURFACE ANTIBODY, QUANTITATIVE: Hepatitis B Surf Ab Quant: 8.7 m[IU]/mL — ABNORMAL LOW (ref >9.9)

## 2018-01-02 LAB — VITAMIN B12: Vitamin B-12: 1265 pg/mL — ABNORMAL HIGH (ref 232–1245)

## 2018-01-02 LAB — SPECIMEN STATUS REPORT

## 2018-01-08 ENCOUNTER — Encounter: Payer: Self-pay | Admitting: Internal Medicine

## 2018-01-08 ENCOUNTER — Ambulatory Visit (INDEPENDENT_AMBULATORY_CARE_PROVIDER_SITE_OTHER): Payer: BLUE CROSS/BLUE SHIELD | Admitting: Internal Medicine

## 2018-01-08 VITALS — BP 102/68 | HR 62 | Temp 97.9°F | Ht 61.0 in | Wt 188.2 lb

## 2018-01-08 DIAGNOSIS — K8020 Calculus of gallbladder without cholecystitis without obstruction: Secondary | ICD-10-CM

## 2018-01-08 DIAGNOSIS — R17 Unspecified jaundice: Secondary | ICD-10-CM | POA: Diagnosis not present

## 2018-01-08 DIAGNOSIS — R1011 Right upper quadrant pain: Secondary | ICD-10-CM | POA: Diagnosis not present

## 2018-01-08 DIAGNOSIS — Z1231 Encounter for screening mammogram for malignant neoplasm of breast: Secondary | ICD-10-CM | POA: Diagnosis not present

## 2018-01-08 NOTE — Addendum Note (Signed)
Addended by: Orland Mustard on: 01/08/2018 12:25 PM   Modules accepted: Orders

## 2018-01-08 NOTE — Progress Notes (Addendum)
No chief complaint on file.  F/u  1. C/o right upper quadrant and [place] 11/25/17 + GS pt is jaundice today but reports medications she is on for lupus and make her jaundice appt with rheumatology Monday will disc with them will refer to Dr. Kreg Shropshire      Review of Systems  Constitutional: Negative for weight loss.  HENT: Negative for hearing loss.   Eyes: Negative for blurred vision.  Respiratory: Negative for shortness of breath.   Cardiovascular: Negative for chest pain.  Gastrointestinal: Positive for abdominal pain.  Musculoskeletal: Positive for back pain. Negative for falls.  Skin: Negative for rash.  Neurological: Negative for headaches.  Psychiatric/Behavioral: Negative for depression.   Past Medical History:  Diagnosis Date  . Anxiety   . Arthritis   . Chronic gastritis    egd 04/18/16 + chronic gastritis and EGD 06/16/16 neg H pylori   . Depression   . Discoid lupus   . Eczema   . Fibroids   . Fibromyalgia   . Gallstones    11/25/17 [place]  . Hashimoto's disease   . Hyperlipemia   . Hypothyroidism   . Hypothyroidism   . Kidney stone    left UVJ 2017 CT scan   . Lateral epicondylitis   . Lupus ([place])   . Morphea   . Morphea   . Plantar fasciitis   . PLE (polymorphic light eruption)   . Prediabetes   . Sleep apnea   . Urine incontinence    Past Surgical History:  Procedure Laterality Date  . ABDOMINAL HYSTERECTOMY     uterus and both ovaries out per pt cervix intact h/o PID in 1986, scar tissue, hemorrhagic cysts; multiple pelvic surgeries 1986 to 2006   . APPENDECTOMY    . cervical radiofrequency     in [place]   . EXTRACORPOREAL SHOCK WAVE LITHOTRIPSY Left 12/20/2015   Procedure: EXTRACORPOREAL SHOCK WAVE LITHOTRIPSY (ESWL);  Surgeon: Nickie Retort, MD;  Location: [facility] ORS;  Service: Urology;  Laterality: Left;  . LAPAROSCOPIC GASTRIC SLEEVE RESECTION     06/2016 Dr. Darnell Level   . LAPAROSCOPIC TOTAL HYSTERECTOMY  2004  . OTHER SURGICAL HISTORY    . ROTATOR  CUFF REPAIR Right    02/2004   Family History  Problem Relation Age of Onset  . Kidney cancer Maternal Grandmother   . Hypertension Maternal Grandmother   . Arthritis Mother   . Hyperlipidemia Mother   . Hypertension Mother   . Heart disease Father   . Early death Father   . Prostate cancer Neg Hx    Social History   Socioeconomic History  . Marital status: Married    Spouse name: Not on file  . Number of children: Not on file  . Years of education: Not on file  . Highest education level: Not on file  Occupational History  . Not on file  Social Needs  . Financial resource strain: Not on file  . Food insecurity:    Worry: Not on file    Inability: Not on file  . Transportation needs:    Medical: Not on file    Non-medical: Not on file  Tobacco Use  . Smoking status: Former Research scientist (life sciences)  . Smokeless tobacco: Never Used  Substance and Sexual Activity  . Alcohol use: Yes  . Drug use: No  . Sexual activity: Not Currently    Comment: female   Lifestyle  . Physical activity:    Days per week: Not on file  Minutes per session: Not on file  . Stress: Not on file  Relationships  . Social connections:    Talks on phone: Not on file    Gets together: Not on file    Attends religious service: Not on file    Active member of club or organization: Not on file    Attends meetings of clubs or organizations: Not on file    Relationship status: Not on file  . Intimate partner violence:    Fear of current or ex partner: Not on file    Emotionally abused: Not on file    Physically abused: Not on file    Forced sexual activity: Not on file  Other Topics Concern  . Not on file  Social History Narrative   [employer]    1 son    Married to wife    No guns, wears seat belt, safe in relationship    [place]   No outpatient medications have been marked as taking for the 01/08/18 encounter (Appointment) with McLean-Scocuzza, Nino Glow, MD.    Allergies  Allergen Reactions  . Plaquenil  [Hydroxychloroquine Sulfate] Rash  . Lactose Intolerance (Gi) Diarrhea  . Omega-3 Rash    UV Rays gives her a rash  . Plaquenil [Hydroxychloroquine] Rash   Recent Results (from the past 2160 hour(s))  Comprehensive metabolic panel     Status: Abnormal   Collection Time: 11/12/17  1:53 PM  Result Value Ref Range   Glucose 98 65 - 99 mg/dL   BUN 19 6 - 24 mg/dL   Creatinine, Ser 0.87 0.57 - 1.00 mg/dL   GFR calc non Af Amer 75 >59 mL/min/1.73   GFR calc Af Amer 86 >59 mL/min/1.73   BUN/Creatinine Ratio 22 9 - 23   Sodium 145 (H) 134 - 144 mmol/L   Potassium 4.0 3.5 - 5.2 mmol/L   Chloride 108 (H) 96 - 106 mmol/L   CO2 23 20 - 29 mmol/L   Calcium 8.9 8.7 - 10.2 mg/dL   Total Protein 6.6 6.0 - 8.5 g/dL   Albumin 4.0 3.5 - 5.5 g/dL   Globulin, Total 2.6 1.5 - 4.5 g/dL   Albumin/Globulin Ratio 1.5 1.2 - 2.2   Bilirubin Total 0.3 0.0 - 1.2 mg/dL   Alkaline Phosphatase 116 39 - 117 IU/L   AST 23 0 - 40 IU/L   ALT 25 0 - 32 IU/L  CBC     Status: None   Collection Time: 11/12/17  1:54 PM  Result Value Ref Range   WBC 6.1 3.4 - 10.8 x10E3/uL   RBC 4.64 3.77 - 5.28 x10E6/uL   Hemoglobin 14.3 11.1 - 15.9 g/dL   Hematocrit 41.6 34.0 - 46.6 %   MCV 90 79 - 97 fL   MCH 30.8 26.6 - 33.0 pg   MCHC 34.4 31.5 - 35.7 g/dL   RDW 14.3 12.3 - 15.4 %   Platelets 201 150 - 450 x10E3/uL  Protime-INR     Status: None   Collection Time: 11/12/17  1:55 PM  Result Value Ref Range   INR 1.0 0.8 - 1.2    Comment: Reference interval is for non-anticoagulated patients. Suggested INR therapeutic range for Vitamin K antagonist therapy:    Standard Dose (moderate intensity                   therapeutic range):       2.0 - 3.0    Higher intensity therapeutic  range       2.5 - 3.5    Prothrombin Time 10.4 9.1 - 12.0 sec  Comprehensive metabolic panel     Status: Abnormal   Collection Time: 12/25/17  8:52 AM  Result Value Ref Range   Glucose 108 (H) 65  - 99 mg/dL   BUN 23 6 - 24 mg/dL   Creatinine, Ser 0.99 0.57 - 1.00 mg/dL   GFR calc non Af Amer 64 >59 mL/min/1.73   GFR calc Af Amer 74 >59 mL/min/1.73   BUN/Creatinine Ratio 23 9 - 23   Sodium 144 134 - 144 mmol/L   Potassium 3.9 3.5 - 5.2 mmol/L   Chloride 106 96 - 106 mmol/L   CO2 22 20 - 29 mmol/L   Calcium 9.4 8.7 - 10.2 mg/dL   Total Protein 7.2 6.0 - 8.5 g/dL   Albumin 4.3 3.5 - 5.5 g/dL   Globulin, Total 2.9 1.5 - 4.5 g/dL   Albumin/Globulin Ratio 1.5 1.2 - 2.2   Bilirubin Total 0.5 0.0 - 1.2 mg/dL   Alkaline Phosphatase 111 39 - 117 IU/L   AST 18 0 - 40 IU/L   ALT 29 0 - 32 IU/L  Urinalysis, Routine w reflex microscopic     Status: Abnormal   Collection Time: 12/25/17  8:52 AM  Result Value Ref Range   Specific Gravity, UA      >=1.030 (A) 1.005 - 1.030   pH, UA 5.5 5.0 - 7.5   Color, UA Yellow Yellow   Appearance Ur Clear Clear   Leukocytes, UA Negative Negative   Protein, UA Negative Negative/Trace   Glucose, UA Negative Negative   Ketones, UA Negative Negative   RBC, UA Negative Negative   Bilirubin, UA Negative Negative   Urobilinogen, Ur 0.2 0.2 - 1.0 mg/dL   Nitrite, UA Negative Negative   Microscopic Examination Comment     Comment: Microscopic not indicated and not performed.  Lipid Panel w/o Chol/HDL Ratio     Status: Abnormal   Collection Time: 12/25/17  8:52 AM  Result Value Ref Range   Cholesterol, Total 196 100 - 199 mg/dL   Triglycerides 86 0 - 149 mg/dL   HDL 78 >39 mg/dL   VLDL Cholesterol Cal 17 5 - 40 mg/dL   LDL Calculated 101 (H) 0 - 99 mg/dL  Measles/Mumps/Rubella Immunity     Status: None   Collection Time: 12/25/17  8:52 AM  Result Value Ref Range   Rubella Antibodies, IGG >33.00 Immune >0.99 index    Comment:                                 Non-immune       <0.90                                 Equivocal  0.90 - 0.99                                 Immune           >0.99    RUBEOLA AB, IGG >300.0 Immune >16.4 AU/mL    Comment:  Negative        <13.5                                  Equivocal 13.5 - 16.4                                  Positive        >16.4 Presence of antibodies to Rubeola is presumptive evidence of immunity except when acute infection is suspected.    MUMPS ABS, IGG >300.0 Immune >10.9 AU/mL    Comment:                                 Negative         <9.0                                 Equivocal  9.0 - 10.9                                 Positive        >10.9 A positive result generally indicates past exposure to Mumps virus or previous vaccination.   Hgb A1c w/o eAG     Status: Abnormal   Collection Time: 12/25/17  8:52 AM  Result Value Ref Range   Hgb A1c MFr Bld 5.9 (H) 4.8 - 5.6 %    Comment:          Prediabetes: 5.7 - 6.4          Diabetes: >6.4          Glycemic control for adults with diabetes: <7.0   TSH     Status: None   Collection Time: 12/25/17  8:52 AM  Result Value Ref Range   TSH 1.350 0.450 - 4.500 uIU/mL  Hepatitis B surface antibody,qualitative     Status: None   Collection Time: 12/25/17  8:52 AM  Result Value Ref Range   Hep B Surface Ab, Qual Reactive     Comment:               Non Reactive: Inconsistent with immunity,                             less than 10 mIU/mL               Reactive:     Consistent with immunity,                             greater than 9.9 mIU/mL **Verified by repeat analysis**   VITAMIN D 25 Hydroxy (Vit-D Deficiency, Fractures)     Status: None   Collection Time: 12/25/17  8:52 AM  Result Value Ref Range   Vit D, 25-Hydroxy 38.2 30.0 - 100.0 ng/mL    Comment: Vitamin D deficiency has been defined by the [place] practice guideline as a level of serum 25-OH vitamin D less than 20 ng/mL (1,2). The Endocrine Society went on to further define vitamin D insufficiency as a level between 21 and 29 ng/mL (2).  1. IOM (Institute of Medicine). 2010. Dietary reference     intakes for calcium and D. [place]: The    [employer]. 2. Holick MF, Binkley Mulberry, Bischoff-Ferrari HA, et al.    Evaluation, treatment, and prevention of vitamin D    deficiency: an Endocrine Society clinical practice    guideline. JCEM. 2011 Jul; 96(7):1911-30.   Specimen status report     Status: None   Collection Time: 12/25/17  8:52 AM  Result Value Ref Range   specimen status report Comment     Comment: Verbal Order See below: Comment: Please provide requested information and fax to [phone] or [phone]. The [place] Code of [employer] requires a written and signed request be forwarded to a laboratory following a verbal order of a laboratory test.  Please assist [place] to meet this requirement and to complete our records. Date:______________________________ ICD-9/10 Diagnosis Code(s):___________________________________________ Physician or Authorized Designee:_____________________________________                                               Please Print Physician or Authorized Designee Signature: ______________________________________________________________________ Your Animal nutritionist Your Order Of The Test(s) Listed Additional Test(s) Requested Comment: Test(s) added per Cannon Kettle at account 12-29-2017 Logged by Lorin Mercy Test# [id number] HCV Antibody Test# [id number] Hepatitis B Surf Ab Quant Diagnosis Codes Provided    Z11.59      Z13.818   Hepatitis C antibody     Status: None   Collection Time: 12/25/17  8:52 AM  Result Value Ref Range   Hep C Virus Ab <0.1 0.0 - 0.9 s/co ratio    Comment:                                   Negative:     < 0.8                              Indeterminate: 0.8 - 0.9                                   Positive:     > 0.9  The CDC recommends that a positive HCV antibody result  be followed up with a HCV Nucleic Acid Amplification  test ([id number]).   Hepatitis B surface  antibody,quantitative     Status: Abnormal   Collection Time: 12/25/17  8:52 AM  Result Value Ref Range   Hepatitis B Surf Ab Quant 8.7 (L) Immunity>9.9 mIU/mL    Comment: **Verified by repeat analysis**   Status of Immunity                     Anti-HBs Level   ------------------                     -------------- Inconsistent with Immunity                   0.0 - 9.9 Consistent with Immunity                          >9.9   Specimen status report     Status:  None   Collection Time: 12/25/17  8:52 AM  Result Value Ref Range   specimen status report Comment     Comment: Written Authorization Written Authorization Written Authorization Received. Authorization received from [place] 12-30-2017 Logged by Lorayne Marek   Vitamin B12     Status: Abnormal   Collection Time: 12/25/17  8:52 AM  Result Value Ref Range   Vitamin B-12 1,265 (H) 232 - 1,245 pg/mL  Specimen status report     Status: None   Collection Time: 12/25/17  8:52 AM  Result Value Ref Range   specimen status report Comment     Comment: Written Authorization Written Authorization Written Authorization Received. Authorization received from [facility] 01-01-2018 Logged by Kurtis Bushman    Objective  There is no height or weight on file to calculate BMI. Wt Readings from Last 3 Encounters:  12/10/17 194 lb 8 oz (88.2 kg)  11/17/17 198 lb 9.6 oz (90.1 kg)  07/14/17 191 lb 9.6 oz (86.9 kg)   Temp Readings from Last 3 Encounters:  12/10/17 97.6 F (36.4 C) (Oral)  12/20/15 98.4 F (36.9 C) (Oral)  12/16/15 97.7 F (36.5 C) (Oral)   BP Readings from Last 3 Encounters:  12/10/17 122/78  11/17/17 117/75  07/14/17 119/80   Pulse Readings from Last 3 Encounters:  12/10/17 70  11/17/17 66  07/14/17 68    Physical Exam  Constitutional: She is oriented to person, place, and time. Vital signs are normal. She appears well-developed and well-nourished. She is cooperative.  HENT:  Head: Normocephalic  and atraumatic.  Mouth/Throat: Oropharynx is clear and moist and mucous membranes are normal.  Eyes: Pupils are equal, round, and reactive to light. Conjunctivae are normal.  Cardiovascular: Normal rate, regular rhythm and normal heart sounds.  Pulmonary/Chest: Effort normal and breath sounds normal.  Neurological: She is alert and oriented to person, place, and time. Gait normal.  Skin: Skin is warm, dry and intact.  Psychiatric: She has a normal mood and affect. Her speech is normal and behavior is normal. Judgment and thought content normal. Cognition and memory are normal.  Nursing note and vitals reviewed.   Assessment   1. Right upper quandrant ab pain and jaundice +gallstones 2. HM Plan   1. Disc rheum meds Monday with rheumatology as per pt some can cause juandice  Refer US abdomen complete and Dr. Kreg Shropshire  3.  Flu shot utd Tdap and hep B vx held due to cellcept meds   Colonoscopy and pap get [place] and pap [place] signed today  -speak with Dr. Darene Lamer who has copy  Mammogram pt will fax copy from [place]  S/p hysterectomy fibroids cervix possibly intact will need to do pelvic and possible pap Former smoker quantify how much and long at f/u   Hep B titer 8.7 and hec neg 12/25/17  Ask HIV in future married to wife    F[facility] Tomasa Blase today consider in future for local [facility] exam 02/2017  ColonoscopyThere is a scanned record from 12/02/2017 under the media tab that has her GI records from the [place] from 07/05/14 colonoscopy with a hyperplastic rectal polyp removed  Provider: Dr. Olivia Mackie McLean-Scocuzza-[facility]

## 2018-01-08 NOTE — Patient Instructions (Addendum)
Results for Helen Freeman, Helen Freeman (MRN [id number]) as of 01/08/2018 08:08  Ref. Range 12/25/2017 08:52  Sodium Latest Ref Range: 134 - 144 mmol/L 144  Potassium Latest Ref Range: 3.5 - 5.2 mmol/L 3.9  Chloride Latest Ref Range: 96 - 106 mmol/L 106  CO2 Latest Ref Range: 20 - 29 mmol/L 22  Glucose Latest Ref Range: 65 - 99 mg/dL 108 (H)  BUN Latest Ref Range: 6 - 24 mg/dL 23  Creatinine Latest Ref Range: 0.57 - 1.00 mg/dL 0.99  Calcium Latest Ref Range: 8.7 - 10.2 mg/dL 9.4  BUN/Creatinine Ratio Latest Ref Range: 9 - 23  23  Alkaline Phosphatase Latest Ref Range: 39 - 117 IU/L 111  Albumin Latest Ref Range: 3.5 - 5.5 g/dL 4.3  Albumin/Globulin Ratio Latest Ref Range: 1.2 - 2.2  1.5  AST Latest Ref Range: 0 - 40 IU/L 18  ALT Latest Ref Range: 0 - 32 IU/L 29  Total Protein Latest Ref Range: 6.0 - 8.5 g/dL 7.2  Total Bilirubin Latest Ref Range: 0.0 - 1.2 mg/dL 0.5  GFR, Est Non African American Latest Ref Range: >59 mL/min/1.73 64  GFR, Est African American Latest Ref Range: >59 mL/min/1.73 74    Jaundice, Adult Jaundice is a yellowish discoloration of the skin, the whites of the eyes, and mucous membranes. Jaundice can be a sign that the liver or the bile system is not working normally. What are the causes? This condition is caused by an increased level of bilirubin in the blood. Bilirubin is a substance that is produced by the normal breakdown of red blood cells. Conditions and activities that can cause an increase in the bilirubin level include:  Viral hepatitis.  Gallstones or other conditions, such as a tumor, that can cause a blockage of bile ducts.  Excessive use of alcohol.  Other liver diseases, such as cirrhosis.  Certain cancers.  Certain infections.  Certain genetic syndromes.  Certain medicines.  What are the signs or symptoms? Symptoms of this condition include:  Yellow color to the skin, the whites of the eyes, or mucous membranes.  Dark brown urine.  Stomach  pain.  Light or clay-colored stool.  Itchy skin (pruritus).  How is this diagnosed? This condition is diagnosed with a medical history, physical exam, and blood tests. You may have additional tests to determine what is causing your bilirubin level to increase. How is this treated? Treatment for jaundice depends on the underlying condition. Treatment may include:  Stopping the use of a certain medicine.  Fluids that are given through an IV tube that is inserted into one of your veins.  Medicines to treat pruritus.  Surgery, if there is blockage of the bile ducts.  Follow these instructions at home:  Drink enough fluid to keep your urine clear or pale yellow.  Do not drink alcohol.  Take medicines only as directed by your health care provider.  Keep all follow-up visits as directed by your health care provider. This is important.  You may use skin lotion to relieve itching. Contact a health care provider if:  You have a fever. Get help right away if:  Your symptoms suddenly get worse.  You have symptoms for more than 72 hours.  Your pain gets worse.  You vomit repeatedly.  You become weak or confused.  You develop a severe headache.  You become severely dehydrated. Signs of severe dehydration include: ? A very dry mouth. ? A rapid, weak pulse. ? Rapid breathing. ? Blue lips. This information  is not intended to replace advice given to you by your health care provider. Make sure you discuss any questions you have with your health care provider. Document Released: 01/20/2005 Document Revised: 06/28/2015 Document Reviewed: 01/16/2014 Elsevier Interactive Patient Education  [employer].

## 2018-01-15 ENCOUNTER — Telehealth: Payer: Self-pay

## 2018-01-15 DIAGNOSIS — K8020 Calculus of gallbladder without cholecystitis without obstruction: Secondary | ICD-10-CM

## 2018-01-15 DIAGNOSIS — R1011 Right upper quadrant pain: Secondary | ICD-10-CM

## 2018-01-15 NOTE — Telephone Encounter (Signed)
Re order of [place]

## 2018-01-21 ENCOUNTER — Ambulatory Visit (HOSPITAL_COMMUNITY): Payer: BLUE CROSS/BLUE SHIELD

## 2018-01-21 ENCOUNTER — Ambulatory Visit: Payer: BLUE CROSS/BLUE SHIELD

## 2018-01-28 ENCOUNTER — Ambulatory Visit (HOSPITAL_COMMUNITY)
Admission: RE | Admit: 2018-01-28 | Discharge: 2018-01-28 | Disposition: A | Discharge status: Home / Self Care | Payer: BLUE CROSS/BLUE SHIELD | Source: Ambulatory Visit | Attending: Internal Medicine | Admitting: Internal Medicine

## 2018-01-28 DIAGNOSIS — R1011 Right upper quadrant pain: Secondary | ICD-10-CM | POA: Insufficient documentation

## 2018-01-28 DIAGNOSIS — K8020 Calculus of gallbladder without cholecystitis without obstruction: Secondary | ICD-10-CM

## 2018-01-28 DIAGNOSIS — R17 Unspecified jaundice: Secondary | ICD-10-CM | POA: Diagnosis not present

## 2018-02-08 DIAGNOSIS — J019 Acute sinusitis, unspecified: Secondary | ICD-10-CM | POA: Diagnosis not present

## 2018-02-08 DIAGNOSIS — Z6836 Body mass index (BMI) 36.0-36.9, adult: Secondary | ICD-10-CM | POA: Diagnosis not present

## 2018-02-08 DIAGNOSIS — R05 Cough: Secondary | ICD-10-CM | POA: Diagnosis not present

## 2018-02-12 DIAGNOSIS — Z9884 Bariatric surgery status: Secondary | ICD-10-CM | POA: Diagnosis not present

## 2018-02-12 DIAGNOSIS — L940 Localized scleroderma [morphea]: Secondary | ICD-10-CM | POA: Diagnosis not present

## 2018-02-12 DIAGNOSIS — L930 Discoid lupus erythematosus: Secondary | ICD-10-CM | POA: Diagnosis not present

## 2018-02-17 DIAGNOSIS — Z9049 Acquired absence of other specified parts of digestive tract: Secondary | ICD-10-CM | POA: Diagnosis not present

## 2018-02-17 DIAGNOSIS — Z886 Allergy status to analgesic agent status: Secondary | ICD-10-CM | POA: Diagnosis not present

## 2018-02-17 DIAGNOSIS — Z888 Allergy status to other drugs, medicaments and biological substances status: Secondary | ICD-10-CM | POA: Diagnosis not present

## 2018-02-17 DIAGNOSIS — R1084 Generalized abdominal pain: Secondary | ICD-10-CM | POA: Diagnosis not present

## 2018-02-17 DIAGNOSIS — L930 Discoid lupus erythematosus: Secondary | ICD-10-CM | POA: Diagnosis not present

## 2018-02-17 DIAGNOSIS — K7689 Other specified diseases of liver: Secondary | ICD-10-CM | POA: Diagnosis not present

## 2018-02-17 DIAGNOSIS — Z9884 Bariatric surgery status: Secondary | ICD-10-CM | POA: Diagnosis not present

## 2018-02-17 DIAGNOSIS — K831 Obstruction of bile duct: Secondary | ICD-10-CM | POA: Diagnosis not present

## 2018-02-17 DIAGNOSIS — Z87891 Personal history of nicotine dependence: Secondary | ICD-10-CM | POA: Diagnosis not present

## 2018-02-17 DIAGNOSIS — E785 Hyperlipidemia, unspecified: Secondary | ICD-10-CM | POA: Diagnosis not present

## 2018-02-17 DIAGNOSIS — R1011 Right upper quadrant pain: Secondary | ICD-10-CM | POA: Diagnosis not present

## 2018-02-17 DIAGNOSIS — Z8249 Family history of ischemic heart disease and other diseases of the circulatory system: Secondary | ICD-10-CM | POA: Diagnosis not present

## 2018-02-17 DIAGNOSIS — K810 Acute cholecystitis: Secondary | ICD-10-CM | POA: Diagnosis not present

## 2018-02-17 DIAGNOSIS — K8010 Calculus of gallbladder with chronic cholecystitis without obstruction: Secondary | ICD-10-CM | POA: Diagnosis not present

## 2018-02-17 DIAGNOSIS — R1013 Epigastric pain: Secondary | ICD-10-CM | POA: Diagnosis not present

## 2018-02-17 DIAGNOSIS — K8080 Other cholelithiasis without obstruction: Secondary | ICD-10-CM | POA: Diagnosis not present

## 2018-02-17 DIAGNOSIS — Z833 Family history of diabetes mellitus: Secondary | ICD-10-CM | POA: Diagnosis not present

## 2018-02-17 DIAGNOSIS — Z79890 Hormone replacement therapy (postmenopausal): Secondary | ICD-10-CM | POA: Diagnosis not present

## 2018-02-17 DIAGNOSIS — Z79899 Other long term (current) drug therapy: Secondary | ICD-10-CM | POA: Diagnosis not present

## 2018-02-17 DIAGNOSIS — K8000 Calculus of gallbladder with acute cholecystitis without obstruction: Secondary | ICD-10-CM | POA: Diagnosis not present

## 2018-02-17 DIAGNOSIS — Z792 Long term (current) use of antibiotics: Secondary | ICD-10-CM | POA: Diagnosis not present

## 2018-02-17 DIAGNOSIS — R17 Unspecified jaundice: Secondary | ICD-10-CM | POA: Diagnosis not present

## 2018-02-17 DIAGNOSIS — R945 Abnormal results of liver function studies: Secondary | ICD-10-CM | POA: Diagnosis not present

## 2018-02-17 DIAGNOSIS — K8020 Calculus of gallbladder without cholecystitis without obstruction: Secondary | ICD-10-CM | POA: Diagnosis not present

## 2018-02-17 DIAGNOSIS — F329 Major depressive disorder, single episode, unspecified: Secondary | ICD-10-CM | POA: Diagnosis not present

## 2018-02-17 DIAGNOSIS — K8050 Calculus of bile duct without cholangitis or cholecystitis without obstruction: Secondary | ICD-10-CM | POA: Diagnosis not present

## 2018-02-17 DIAGNOSIS — E063 Autoimmune thyroiditis: Secondary | ICD-10-CM | POA: Diagnosis not present

## 2018-02-17 DIAGNOSIS — K219 Gastro-esophageal reflux disease without esophagitis: Secondary | ICD-10-CM | POA: Diagnosis not present

## 2018-02-17 DIAGNOSIS — K760 Fatty (change of) liver, not elsewhere classified: Secondary | ICD-10-CM | POA: Diagnosis not present

## 2018-02-18 DIAGNOSIS — K760 Fatty (change of) liver, not elsewhere classified: Secondary | ICD-10-CM | POA: Diagnosis not present

## 2018-02-18 DIAGNOSIS — F329 Major depressive disorder, single episode, unspecified: Secondary | ICD-10-CM | POA: Diagnosis not present

## 2018-02-18 DIAGNOSIS — R1013 Epigastric pain: Secondary | ICD-10-CM | POA: Diagnosis not present

## 2018-02-18 DIAGNOSIS — K810 Acute cholecystitis: Secondary | ICD-10-CM | POA: Diagnosis not present

## 2018-02-18 DIAGNOSIS — K8000 Calculus of gallbladder with acute cholecystitis without obstruction: Secondary | ICD-10-CM | POA: Diagnosis not present

## 2018-02-18 DIAGNOSIS — K7689 Other specified diseases of liver: Secondary | ICD-10-CM | POA: Diagnosis not present

## 2018-02-18 DIAGNOSIS — K8050 Calculus of bile duct without cholangitis or cholecystitis without obstruction: Secondary | ICD-10-CM | POA: Diagnosis not present

## 2018-02-18 DIAGNOSIS — K831 Obstruction of bile duct: Secondary | ICD-10-CM | POA: Diagnosis not present

## 2018-02-18 DIAGNOSIS — K8010 Calculus of gallbladder with chronic cholecystitis without obstruction: Secondary | ICD-10-CM | POA: Diagnosis not present

## 2018-02-18 DIAGNOSIS — E063 Autoimmune thyroiditis: Secondary | ICD-10-CM | POA: Diagnosis not present

## 2018-02-18 DIAGNOSIS — R17 Unspecified jaundice: Secondary | ICD-10-CM | POA: Diagnosis not present

## 2018-02-18 DIAGNOSIS — Z9884 Bariatric surgery status: Secondary | ICD-10-CM | POA: Diagnosis not present

## 2018-02-20 DIAGNOSIS — E063 Autoimmune thyroiditis: Secondary | ICD-10-CM | POA: Diagnosis not present

## 2018-02-20 DIAGNOSIS — F329 Major depressive disorder, single episode, unspecified: Secondary | ICD-10-CM | POA: Diagnosis not present

## 2018-02-20 DIAGNOSIS — K760 Fatty (change of) liver, not elsewhere classified: Secondary | ICD-10-CM | POA: Diagnosis not present

## 2018-02-20 DIAGNOSIS — K8000 Calculus of gallbladder with acute cholecystitis without obstruction: Secondary | ICD-10-CM | POA: Diagnosis not present

## 2018-02-24 ENCOUNTER — Encounter: Payer: Self-pay | Admitting: Gastroenterology

## 2018-03-18 DIAGNOSIS — R945 Abnormal results of liver function studies: Secondary | ICD-10-CM | POA: Diagnosis not present

## 2018-03-31 DIAGNOSIS — Z1231 Encounter for screening mammogram for malignant neoplasm of breast: Secondary | ICD-10-CM | POA: Diagnosis not present

## 2018-03-31 LAB — HM MAMMOGRAPHY

## 2018-04-05 DIAGNOSIS — I872 Venous insufficiency (chronic) (peripheral): Secondary | ICD-10-CM | POA: Diagnosis not present

## 2018-04-05 DIAGNOSIS — L940 Localized scleroderma [morphea]: Secondary | ICD-10-CM | POA: Diagnosis not present

## 2018-04-05 DIAGNOSIS — L930 Discoid lupus erythematosus: Secondary | ICD-10-CM | POA: Diagnosis not present

## 2018-04-10 ENCOUNTER — Encounter: Payer: Self-pay | Admitting: Internal Medicine

## 2018-04-14 DIAGNOSIS — H25813 Combined forms of age-related cataract, bilateral: Secondary | ICD-10-CM | POA: Diagnosis not present

## 2018-04-14 DIAGNOSIS — H524 Presbyopia: Secondary | ICD-10-CM | POA: Diagnosis not present

## 2018-04-14 DIAGNOSIS — H04123 Dry eye syndrome of bilateral lacrimal glands: Secondary | ICD-10-CM | POA: Diagnosis not present

## 2018-05-11 ENCOUNTER — Ambulatory Visit (INDEPENDENT_AMBULATORY_CARE_PROVIDER_SITE_OTHER): Payer: BLUE CROSS/BLUE SHIELD | Admitting: Internal Medicine

## 2018-05-11 ENCOUNTER — Encounter: Payer: Self-pay | Admitting: Internal Medicine

## 2018-05-11 ENCOUNTER — Other Ambulatory Visit: Payer: Self-pay

## 2018-05-11 DIAGNOSIS — L564 Polymorphous light eruption: Secondary | ICD-10-CM | POA: Diagnosis not present

## 2018-05-11 DIAGNOSIS — L409 Psoriasis, unspecified: Secondary | ICD-10-CM | POA: Insufficient documentation

## 2018-05-11 DIAGNOSIS — L309 Dermatitis, unspecified: Secondary | ICD-10-CM

## 2018-05-11 DIAGNOSIS — L930 Discoid lupus erythematosus: Secondary | ICD-10-CM | POA: Diagnosis not present

## 2018-05-11 DIAGNOSIS — J309 Allergic rhinitis, unspecified: Secondary | ICD-10-CM

## 2018-05-11 DIAGNOSIS — L940 Localized scleroderma [morphea]: Secondary | ICD-10-CM

## 2018-05-11 MED ORDER — HYDROXYZINE HCL 10 MG PO TABS: 20.0000 mg | ORAL_TABLET | Freq: Every evening | ORAL | 0 refills | Status: DC | PRN

## 2018-05-11 MED ORDER — IPRATROPIUM BROMIDE 0.06 % NA SOLN: 2.0000 | Freq: Four times a day (QID) | NASAL | 12 refills | Status: DC

## 2018-05-11 NOTE — Progress Notes (Signed)
Telephone Note  I connected with Helen Freeman on 05/11/18 at  8:05-8:20 AM EDT by telephone application and verified that I am speaking with the correct person using two identifiers.  Location patient: work Environmental manager  Persons participating in the virtual visit: patient, provider  I discussed the limitations of evaluation and management by telemedicine and the availability of in person appointments. The patient expressed understanding and agreed to proceed.   HPI: F/u pts reports she needs to sch annual in person in the future   Allergic rhinitis nose running all day takes atarax 20 mg qhs prn no other antihistamines and agreeable to try a nose spray   H/o eczema, morphea, PSA, DLE, PMLE allergic to UV light on cellcept and quinacrine has been al while since she has seen rheumatology and dermatology Dr. Will Bonnet at [facility]. She is using HC 2.5% on left arm lesion and leg lesions which are different and Clobetasol oint. Leg lesions are circular and itchy   S/p  GB removal 02/2018 Dr. Darnell Level doing better no n/v/abdomen pain better but took a while for abdominal pain to heel    ROS: See pertinent positives and negatives per HPI.  Past Medical History:  Diagnosis Date  . Anxiety   . Arthritis   . Chronic gastritis    egd 04/18/16 + chronic gastritis and EGD 06/16/16 neg H pylori   . Depression   . Discoid lupus   . Eczema   . Fibroids   . Fibromyalgia   . Gallstones    11/25/17 [place]  . Hashimoto's disease   . Hyperlipemia   . Hypothyroidism   . Hypothyroidism   . Kidney stone    left UVJ 2017 CT scan   . Lateral epicondylitis   . Lupus ([place])   . Morphea   . Morphea   . Plantar fasciitis   . PLE (polymorphic light eruption)   . Prediabetes   . Sleep apnea   . Urine incontinence     Past Surgical History:  Procedure Laterality Date  . ABDOMINAL HYSTERECTOMY     uterus and both ovaries out per pt cervix intact h/o PID in 1986, scar tissue, hemorrhagic cysts;  multiple pelvic surgeries 1986 to 2006   . APPENDECTOMY    . cervical radiofrequency     in [place]   . EXTRACORPOREAL SHOCK WAVE LITHOTRIPSY Left 12/20/2015   Procedure: EXTRACORPOREAL SHOCK WAVE LITHOTRIPSY (ESWL);  Surgeon: Nickie Retort, MD;  Location: [facility] ORS;  Service: Urology;  Laterality: Left;  . GALLBLADDER SURGERY     02/2018 Dr. Darnell Level  . LAPAROSCOPIC GASTRIC SLEEVE RESECTION     06/2016 Dr. Darnell Level   . LAPAROSCOPIC TOTAL HYSTERECTOMY  2004  . OTHER SURGICAL HISTORY    . ROTATOR CUFF REPAIR Right    02/2004    Family History  Problem Relation Age of Onset  . Kidney cancer Maternal Grandmother   . Hypertension Maternal Grandmother   . Arthritis Mother   . Hyperlipidemia Mother   . Hypertension Mother   . Heart disease Father   . Early death Father   . Prostate cancer Neg Hx     SOCIAL HX: works Hydrologist    Current Outpatient Medications:  .  atorvastatin (LIPITOR) 20 MG tablet, Take 20 mg by mouth daily., Disp: , Rfl:  .  Carboxymethylcellulose Sodium (THERATEARS) 0.25 % SOLN, Administer 2 drops to both eyes daily as needed., Disp: , Rfl:  .  clobetasol ointment (TEMOVATE) 0.05 %, clobetasol 0.05 % topical  ointment, Disp: , Rfl:  .  cyclobenzaprine (FLEXERIL) 5 MG tablet, Take 5 mg by mouth 3 (three) times daily as needed for muscle spasms., Disp: , Rfl:  .  cycloSPORINE (RESTASIS MULTIDOSE) 0.05 % ophthalmic emulsion, Administer 2 drops to both eyes every morning., Disp: , Rfl:  .  DULoxetine (CYMBALTA) 20 MG capsule, Take by mouth., Disp: , Rfl:  .  eletriptan (RELPAX) 40 MG tablet, Take by mouth., Disp: , Rfl:  .  fluticasone (FLONASE) 50 MCG/ACT nasal spray, fluticasone propionate 50 mcg/actuation nasal spray,suspension, Disp: , Rfl:  .  hydrocortisone 2.5 % cream, Apply topically., Disp: , Rfl:  .  hydrOXYzine (ATARAX/VISTARIL) 10 MG tablet, Take 2 tablets (20 mg total) by mouth at bedtime as needed., Disp: 30 tablet, Rfl: 0 .  levothyroxine (SYNTHROID, LEVOTHROID)  100 MCG tablet, Take 175 mcg by mouth daily before breakfast. , Disp: , Rfl:  .  morphine (MSIR) 15 MG tablet, Take 15 mg by mouth every 4 (four) hours as needed. , Disp: , Rfl:  .  mycophenolate (CELLCEPT) 500 MG tablet, Take 1,000 mg by mouth daily., Disp: , Rfl:  .  mycophenolate (CELLCEPT) 500 MG tablet, Take 500 mg by mouth at bedtime., Disp: , Rfl:  .  oxyCODONE-acetaminophen (ROXICET) 5-325 MG tablet, Take 1 tablet by mouth every 4 (four) hours as needed for severe pain., Disp: 30 tablet, Rfl: 0 .  pantoprazole (PROTONIX) 40 MG tablet, Take 1 tablet (40 mg total) by mouth daily. At lunch 30 minutes before food, Disp: 90 tablet, Rfl: 0 .  Quinacrine HCl POWD, , Disp: , Rfl:  .  rizatriptan (MAXALT) 10 MG tablet, Take by mouth., Disp: , Rfl:  .  tamsulosin (FLOMAX) 0.4 MG CAPS capsule, Take 1 capsule (0.4 mg total) by mouth daily., Disp: 7 capsule, Rfl: 0 .  topiramate (TOPAMAX) 25 MG capsule, Take 25 mg by mouth 2 (two) times daily., Disp: , Rfl:  .  ipratropium (ATROVENT) 0.06 % nasal spray, Place 2 sprays into both nostrils 4 (four) times daily., Disp: 15 mL, Rfl: 12  EXAM: via telephone   VITALS per patient if applicable:  GENERAL: alert, oriented, appears well and in no acute distress  PSYCH/NEURO: pleasant and cooperative, no obvious depression or anxiety, speech and thought processing grossly intact  Skin per pt rash to left upper arm and legs  ASSESSMENT AND PLAN:  Discussed the following assessment and plan:  1. Allergic rhinitis, unspecified seasonality, unspecified trigger - Plan: ipratropium (ATROVENT) 0.06 % nasal spray Atarax 20 mg qhs prn  2. Eczema, unspecified type Discoid lupus PLE (polymorphic light eruption) Morphea Psoriasis -rec pt f/u with derm Dr. Louisa Second and rheumatology and consider Dapsone  3.  Flu shot utd Tdap and hep B vx held due to cellcept meds   Colonoscopy and pap get [place] and [facility] signed today  -colonoscopy  07/05/14 hyperplastic rectal polyp [facility]amm[facility] 03/31/2018  S/p hysterectomyfibroids cervix possibly intact will need to do pelvic and possible pap Former smoker quantify how much and long at f/u  Hep B titer 8.7 and hec neg 12/25/17 Ask HIV in future married to wife  F/u dermatology Dr. Ferrel Logan [facility] management [facility] disc Dr. Tomasa Blase today consider in future for local MD Eye exam 02/2017  ColonoscopyThere is a scanned record from 12/02/2017 under the media tab that has her GI records from the [place] from 07/05/14 colonoscopy with a hyperplastic rectal polyp removed    I discussed  the assessment and treatment plan with the patient. The patient was provided an opportunity to ask questions and all were answered. The patient agreed with the plan and demonstrated an understanding of the instructions.   The patient was advised to call back or seek an in-person evaluation if the symptoms worsen or if the condition fails to improve as anticipated.  I provided 15 minutes of non-face-to-face time during this encounter.   Nino Glow McLean-Scocuzza, MD

## 2018-05-13 ENCOUNTER — Telehealth: Payer: Self-pay | Admitting: Internal Medicine

## 2018-05-13 NOTE — Telephone Encounter (Signed)
sch f/u in 6 months   Thanks [place]

## 2018-05-14 DIAGNOSIS — H52223 Regular astigmatism, bilateral: Secondary | ICD-10-CM | POA: Diagnosis not present

## 2018-05-20 NOTE — Telephone Encounter (Signed)
Pt has been scheduled. Thank you.

## 2018-06-02 DIAGNOSIS — H524 Presbyopia: Secondary | ICD-10-CM | POA: Diagnosis not present

## 2018-06-18 DIAGNOSIS — L930 Discoid lupus erythematosus: Secondary | ICD-10-CM | POA: Diagnosis not present

## 2018-06-23 DIAGNOSIS — Z760 Encounter for issue of repeat prescription: Secondary | ICD-10-CM | POA: Diagnosis not present

## 2018-06-29 DIAGNOSIS — Z9884 Bariatric surgery status: Secondary | ICD-10-CM | POA: Diagnosis not present

## 2018-06-29 DIAGNOSIS — Z6835 Body mass index (BMI) 35.0-35.9, adult: Secondary | ICD-10-CM | POA: Diagnosis not present

## 2018-07-26 DIAGNOSIS — Z5181 Encounter for therapeutic drug level monitoring: Secondary | ICD-10-CM | POA: Diagnosis not present

## 2018-07-26 DIAGNOSIS — M329 Systemic lupus erythematosus, unspecified: Secondary | ICD-10-CM | POA: Diagnosis not present

## 2018-07-26 DIAGNOSIS — L309 Dermatitis, unspecified: Secondary | ICD-10-CM | POA: Diagnosis not present

## 2018-08-09 DIAGNOSIS — Z79899 Other long term (current) drug therapy: Secondary | ICD-10-CM | POA: Diagnosis not present

## 2018-08-09 DIAGNOSIS — L930 Discoid lupus erythematosus: Secondary | ICD-10-CM | POA: Diagnosis not present

## 2018-08-09 DIAGNOSIS — I872 Venous insufficiency (chronic) (peripheral): Secondary | ICD-10-CM | POA: Diagnosis not present

## 2018-08-09 DIAGNOSIS — L940 Localized scleroderma [morphea]: Secondary | ICD-10-CM | POA: Diagnosis not present

## 2018-08-09 DIAGNOSIS — L209 Atopic dermatitis, unspecified: Secondary | ICD-10-CM | POA: Diagnosis not present

## 2018-08-13 DIAGNOSIS — E039 Hypothyroidism, unspecified: Secondary | ICD-10-CM | POA: Diagnosis not present

## 2018-08-13 DIAGNOSIS — L930 Discoid lupus erythematosus: Secondary | ICD-10-CM | POA: Diagnosis not present

## 2018-08-13 DIAGNOSIS — E785 Hyperlipidemia, unspecified: Secondary | ICD-10-CM | POA: Diagnosis not present

## 2018-08-13 DIAGNOSIS — E669 Obesity, unspecified: Secondary | ICD-10-CM | POA: Diagnosis not present

## 2018-08-21 DIAGNOSIS — J069 Acute upper respiratory infection, unspecified: Secondary | ICD-10-CM | POA: Diagnosis not present

## 2018-08-21 DIAGNOSIS — Z20828 Contact with and (suspected) exposure to other viral communicable diseases: Secondary | ICD-10-CM | POA: Diagnosis not present

## 2018-08-25 ENCOUNTER — Telehealth: Payer: Self-pay

## 2018-08-25 NOTE — Telephone Encounter (Signed)
Everything has been faxed.

## 2018-08-25 NOTE — Telephone Encounter (Signed)
Copied from [place] [id number]. Topic: General - Other >> Aug 25, 2018  8:47 AM Leward Quan A wrote: Reason for CRM: Fummi with [employer] called to request patients last office visit note, mammogram, colonoscopy report, medication list and need to know patients next visit date and want to know if patient is up to date with visits and is adhering to treatment plan. Please fax information to Fax# [phone] and call # [phone]

## 2018-08-25 NOTE — Telephone Encounter (Signed)
Fax mammogram, colonoscopy do not have report only results, medication list and notes from 05/10/17, 01/08/18 and 12/10/17  Print and fax all notes as well   Next office visit in 11/2018 write this on cover sheet  She is adhering to treatment plan write this as well   [place]

## 2018-08-31 DIAGNOSIS — Z9884 Bariatric surgery status: Secondary | ICD-10-CM | POA: Diagnosis not present

## 2018-10-07 DIAGNOSIS — M24112 Other articular cartilage disorders, left shoulder: Secondary | ICD-10-CM | POA: Diagnosis not present

## 2018-11-08 DIAGNOSIS — L940 Localized scleroderma [morphea]: Secondary | ICD-10-CM | POA: Diagnosis not present

## 2018-11-08 DIAGNOSIS — L930 Discoid lupus erythematosus: Secondary | ICD-10-CM | POA: Diagnosis not present

## 2018-11-08 DIAGNOSIS — L209 Atopic dermatitis, unspecified: Secondary | ICD-10-CM | POA: Diagnosis not present

## 2018-11-08 DIAGNOSIS — I872 Venous insufficiency (chronic) (peripheral): Secondary | ICD-10-CM | POA: Diagnosis not present

## 2018-11-18 ENCOUNTER — Other Ambulatory Visit: Payer: Self-pay

## 2018-11-19 ENCOUNTER — Other Ambulatory Visit: Payer: Self-pay

## 2018-11-19 ENCOUNTER — Ambulatory Visit (INDEPENDENT_AMBULATORY_CARE_PROVIDER_SITE_OTHER): Payer: BC Managed Care – PPO | Admitting: Internal Medicine

## 2018-11-19 ENCOUNTER — Encounter: Payer: Self-pay | Admitting: Internal Medicine

## 2018-11-19 VITALS — BP 106/74 | HR 85 | Temp 97.3°F | Ht 61.0 in | Wt 190.2 lb

## 2018-11-19 DIAGNOSIS — E559 Vitamin D deficiency, unspecified: Secondary | ICD-10-CM

## 2018-11-19 DIAGNOSIS — Z13220 Encounter for screening for lipoid disorders: Secondary | ICD-10-CM | POA: Diagnosis not present

## 2018-11-19 DIAGNOSIS — Z0000 Encounter for general adult medical examination without abnormal findings: Secondary | ICD-10-CM | POA: Diagnosis not present

## 2018-11-19 DIAGNOSIS — E611 Iron deficiency: Secondary | ICD-10-CM

## 2018-11-19 DIAGNOSIS — E538 Deficiency of other specified B group vitamins: Secondary | ICD-10-CM

## 2018-11-19 DIAGNOSIS — Z1231 Encounter for screening mammogram for malignant neoplasm of breast: Secondary | ICD-10-CM

## 2018-11-19 DIAGNOSIS — G4700 Insomnia, unspecified: Secondary | ICD-10-CM | POA: Diagnosis not present

## 2018-11-19 DIAGNOSIS — R7303 Prediabetes: Secondary | ICD-10-CM

## 2018-11-19 DIAGNOSIS — Z1389 Encounter for screening for other disorder: Secondary | ICD-10-CM

## 2018-11-19 DIAGNOSIS — Z1329 Encounter for screening for other suspected endocrine disorder: Secondary | ICD-10-CM

## 2018-11-19 MED ORDER — ESZOPICLONE 2 MG PO TABS: 1.0000 mg | ORAL_TABLET | Freq: Every evening | ORAL | 5 refills | Status: DC | PRN

## 2018-11-19 NOTE — Patient Instructions (Addendum)
Also look into Stress relax brand L theanine 100-200 mg at night  Or tranquil sleep  Try meditation and aromatherapy   Low energy shock wave therapy for chronic pain  -Dr. Kreg Shropshire   Debrox ear wax drops   Insomnia Insomnia is a sleep disorder that makes it difficult to fall asleep or stay asleep. Insomnia can cause fatigue, low energy, difficulty concentrating, mood swings, and poor performance at work or school. There are three different ways to classify insomnia:  Difficulty falling asleep.  Difficulty staying asleep.  Waking up too early in the morning. Any type of insomnia can be long-term (chronic) or short-term (acute). Both are common. Short-term insomnia usually lasts for three months or less. Chronic insomnia occurs at least three times a week for longer than three months. What are the causes? Insomnia may be caused by another condition, situation, or substance, such as:  Anxiety.  Certain medicines.  Gastroesophageal reflux disease (GERD) or other gastrointestinal conditions.  Asthma or other breathing conditions.  Restless legs syndrome, sleep apnea, or other sleep disorders.  Chronic pain.  Menopause.  Stroke.  Abuse of alcohol, tobacco, or illegal drugs.  Mental health conditions, such as depression.  Caffeine.  Neurological disorders, such as Alzheimer's disease.  An overactive thyroid (hyperthyroidism). Sometimes, the cause of insomnia may not be known. What increases the risk? Risk factors for insomnia include:  Gender. Women are affected more often than men.  Age. Insomnia is more common as you get older.  Stress.  Lack of exercise.  Irregular work schedule or working night shifts.  Traveling between different time zones.  Certain medical and mental health conditions. What are the signs or symptoms? If you have insomnia, the main symptom is having trouble falling asleep or having trouble staying asleep. This may lead to other  symptoms, such as:  Feeling fatigued or having low energy.  Feeling nervous about going to sleep.  Not feeling rested in the morning.  Having trouble concentrating.  Feeling irritable, anxious, or depressed. How is this diagnosed? This condition may be diagnosed based on:  Your symptoms and medical history. Your health care provider may ask about: ? Your sleep habits. ? Any medical conditions you have. ? Your mental health.  A physical exam. How is this treated? Treatment for insomnia depends on the cause. Treatment may focus on treating an underlying condition that is causing insomnia. Treatment may also include:  Medicines to help you sleep.  Counseling or therapy.  Lifestyle adjustments to help you sleep better. Follow these instructions at home: Eating and drinking   Limit or avoid alcohol, caffeinated beverages, and cigarettes, especially close to bedtime. These can disrupt your sleep.  Do not eat a large meal or eat spicy foods right before bedtime. This can lead to digestive discomfort that can make it hard for you to sleep. Sleep habits   Keep a sleep diary to help you and your health care provider figure out what could be causing your insomnia. Write down: ? When you sleep. ? When you wake up during the night. ? How well you sleep. ? How rested you feel the next day. ? Any side effects of medicines you are taking. ? What you eat and drink.  Make your bedroom a dark, comfortable place where it is easy to fall asleep. ? Put up shades or blackout curtains to block light from outside. ? Use a white noise machine to block noise. ? Keep the temperature cool.  Limit screen use  before bedtime. This includes: ? Watching TV. ? Using your smartphone, tablet, or computer.  Stick to a routine that includes going to bed and waking up at the same times every day and night. This can help you fall asleep faster. Consider making a quiet activity, such as reading, part of  your nighttime routine.  Try to avoid taking naps during the day so that you sleep better at night.  Get out of bed if you are still awake after 15 minutes of trying to sleep. Keep the lights down, but try reading or doing a quiet activity. When you feel sleepy, go back to bed. General instructions  Take over-the-counter and prescription medicines only as told by your health care provider.  Exercise regularly, as told by your health care provider. Avoid exercise starting several hours before bedtime.  Use relaxation techniques to manage stress. Ask your health care provider to suggest some techniques that may work well for you. These may include: ? Breathing exercises. ? Routines to release muscle tension. ? Visualizing peaceful scenes.  Make sure that you drive carefully. Avoid driving if you feel very sleepy.  Keep all follow-up visits as told by your health care provider. This is important. Contact a health care provider if:  You are tired throughout the day.  You have trouble in your daily routine due to sleepiness.  You continue to have sleep problems, or your sleep problems get worse. Get help right away if:  You have serious thoughts about hurting yourself or someone else. If you ever feel like you may hurt yourself or others, or have thoughts about taking your own life, get help right away. You can go to your nearest emergency department or call:  Your local emergency services (911 in the [place]  A suicide crisis helpline, such as the [place] at 407 060 7569. This is open 24 hours a day. Summary  Insomnia is a sleep disorder that makes it difficult to fall asleep or stay asleep.  Insomnia can be long-term (chronic) or short-term (acute).  Treatment for insomnia depends on the cause. Treatment may focus on treating an underlying condition that is causing insomnia.  Keep a sleep diary to help you and your health care provider figure out  what could be causing your insomnia. This information is not intended to replace advice given to you by your health care provider. Make sure you discuss any questions you have with your health care provider. Document Released: 01/18/2000 Document Revised: 01/02/2017 Document Reviewed: 10/30/2016 Elsevier Patient Education  2020 [employer].

## 2018-11-19 NOTE — Progress Notes (Signed)
Chief Complaint  Patient presents with  . Follow-up   Annual doing well  1. Insomnia sleeping 4 hours at night wants to be back on lunesta from 10 pm to 2 am cant go back to sleep   Review of Systems  Constitutional: Negative for weight loss.  HENT: Negative.  Negative for hearing loss.   Eyes: Negative for blurred vision.  Respiratory: Negative for shortness of breath.   Cardiovascular: Negative for chest pain.  Gastrointestinal: Negative for abdominal pain.  Musculoskeletal: Negative for falls.  Skin: Negative for rash.  Neurological: Negative for headaches.  Psychiatric/Behavioral: Negative for depression. The patient has insomnia.    Past Medical History:  Diagnosis Date  . Anxiety   . Arthritis   . Chronic gastritis    egd 04/18/16 + chronic gastritis and EGD 06/16/16 neg H pylori   . Depression   . Discoid lupus   . Eczema   . Fibroids   . Fibromyalgia   . Gallstones    11/25/17 [place]  . Hashimoto's disease   . Hyperlipemia   . Hypothyroidism   . Hypothyroidism   . Kidney stone    left UVJ 2017 CT scan   . Lateral epicondylitis   . Lupus ([place])   . Morphea   . Morphea   . Plantar fasciitis   . PLE (polymorphic light eruption)   . Prediabetes   . Sleep apnea   . Urine incontinence    Past Surgical History:  Procedure Laterality Date  . ABDOMINAL HYSTERECTOMY     uterus and both ovaries out per pt cervix intact h/o PID in 1986, scar tissue, hemorrhagic cysts; multiple pelvic surgeries 1986 to 2006   . APPENDECTOMY    . cervical radiofrequency     in [place]   . EXTRACORPOREAL SHOCK WAVE LITHOTRIPSY Left 12/20/2015   Procedure: EXTRACORPOREAL SHOCK WAVE LITHOTRIPSY (ESWL);  Surgeon: Nickie Retort, MD;  Location: [facility] ORS;  Service: Urology;  Laterality: Left;  . GALLBLADDER SURGERY     02/2018 Dr. Darnell Level  . LAPAROSCOPIC GASTRIC SLEEVE RESECTION     06/2016 Dr. Darnell Level   . LAPAROSCOPIC TOTAL HYSTERECTOMY  2004  . OTHER SURGICAL HISTORY    . ROTATOR CUFF REPAIR  Right    02/2004   Family History  Problem Relation Age of Onset  . Kidney cancer Maternal Grandmother   . Hypertension Maternal Grandmother   . Arthritis Mother   . Hyperlipidemia Mother   . Hypertension Mother   . Heart disease Father   . Early death Father   . Prostate cancer Neg Hx    Social History   Socioeconomic History  . Marital status: Married    Spouse name: Not on file  . Number of children: Not on file  . Years of education: Not on file  . Highest education level: Not on file  Occupational History  . Not on file  Social Needs  . Financial resource strain: Not on file  . Food insecurity    Worry: Not on file    Inability: Not on file  . Transportation needs    Medical: Not on file    Non-medical: Not on file  Tobacco Use  . Smoking status: Former Research scientist (life sciences)  . Smokeless tobacco: Never Used  Substance and Sexual Activity  . Alcohol use: Yes  . Drug use: No  . Sexual activity: Not Currently    Comment: female   Lifestyle  . Physical activity    Days per week: Not on  file    Minutes per session: Not on file  . Stress: Not on file  Relationships  . Social Herbalist on phone: Not on file    Gets together: Not on file    Attends religious service: Not on file    Active member of club or organization: Not on file    Attends meetings of clubs or organizations: Not on file    Relationship status: Not on file  . Intimate partner violence    Fear of current or ex partner: Not on file    Emotionally abused: Not on file    Physically abused: Not on file    Forced sexual activity: Not on file  Other Topics Concern  . 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 atorvastatin (LIPITOR) 20 MG tablet Take 20 mg by mouth daily.  . Carboxymethylcellulose Sodium (THERATEARS)  0.25 % SOLN Administer 2 drops to both eyes daily as needed.  . clobetasol ointment (TEMOVATE) 0.05 % clobetasol 0.05 % topical ointment  . cyclobenzaprine (FLEXERIL) 5 MG tablet Take 5 mg by mouth 3 (three) times daily as needed for muscle spasms.  . cycloSPORINE (RESTASIS MULTIDOSE) 0.05 % ophthalmic emulsion Administer 2 drops to both eyes every morning.  . DULoxetine (CYMBALTA) 20 MG capsule Take by mouth.  . eletriptan (RELPAX) 40 MG tablet Take by mouth.  . fluticasone (FLONASE) 50 MCG/ACT nasal spray fluticasone propionate 50 mcg/actuation nasal spray,suspension  . hydrocortisone 2.5 % cream Apply topically.  . hydrOXYzine (ATARAX/VISTARIL) 10 MG tablet Take 2 tablets (20 mg total) by mouth at bedtime as needed.  Marland Kitchen ipratropium (ATROVENT) 0.06 % nasal spray Place 2 sprays into both nostrils 4 (four) times daily.  Marland Kitchen levothyroxine (SYNTHROID, LEVOTHROID) 100 MCG tablet Take 175 mcg by mouth daily before breakfast.   . morphine (MSIR) 15 MG tablet Take 15 mg by mouth every 4 (four) hours as needed.   . mycophenolate (CELLCEPT) 500 MG tablet Take 1,000 mg by mouth daily.  . mycophenolate (CELLCEPT) 500 MG tablet Take 500 mg by mouth at bedtime.  Marland Kitchen oxyCODONE-acetaminophen (ROXICET) 5-325 MG tablet Take 1 tablet by mouth every 4 (four) hours as needed for severe pain.  . pantoprazole (PROTONIX) 40 MG tablet Take 1 tablet (40 mg total) by mouth daily. At lunch 30 minutes before food  . Quinacrine HCl POWD   . rizatriptan (MAXALT) 10 MG tablet Take by mouth.  . tamsulosin (FLOMAX) 0.4 MG CAPS capsule Take 1 capsule (0.4 mg total) by mouth daily.  Marland Kitchen topiramate (TOPAMAX) 25 MG capsule Take 25 mg by mouth 2 (two) times daily.   Allergies  Allergen Reactions  . Plaquenil  [Hydroxychloroquine Sulfate] Rash  . Lactose Intolerance (Gi) Diarrhea  . Omega-3 Rash    UV Rays gives her a rash  . Plaquenil [Hydroxychloroquine] Rash   No results found for this or any previous visit (from the past 2160  hour(s)). Objective  Body mass index is 35.94 kg/m. Wt Readings from Last 3 Encounters:  11/19/18 190 lb 3.2 oz (86.3 kg)  01/08/18 188 lb 3.2 oz (85.4 kg)  12/10/17 194 lb 8 oz (88.2 kg)   Temp Readings from Last 3 Encounters:  11/19/18 (!) 97.3 F (36.3 C) (  Oral)  01/08/18 97.9 F (36.6 C)  12/10/17 97.6 F (36.4 C) (Oral)   BP Readings from Last 3 Encounters:  11/19/18 106/74  01/08/18 102/68  12/10/17 122/78   Pulse Readings from Last 3 Encounters:  11/19/18 85  01/08/18 62  12/10/17 70    Physical Exam Vitals signs and nursing note reviewed.  Constitutional:      Appearance: Normal appearance. She is well-developed and well-groomed. She is obese.  HENT:     Head: Normocephalic and atraumatic.     Comments: +mask   Eyes:     Conjunctiva/sclera: Conjunctivae normal.     Pupils: Pupils are equal, round, and reactive to light.  Cardiovascular:     Rate and Rhythm: Normal rate and regular rhythm.     Heart sounds: Normal heart sounds. No murmur.  Pulmonary:     Effort: Pulmonary effort is normal.     Breath sounds: Normal breath sounds.  Skin:    General: Skin is warm and dry.  Neurological:     General: No focal deficit present.     Mental Status: She is alert and oriented to person, place, and time. Mental status is at baseline.     Gait: Gait normal.  Psychiatric:        Attention and Perception: Attention and perception normal.        Mood and Affect: Mood and affect normal.        Behavior: Behavior normal. Behavior is cooperative.        Thought Content: Thought content normal.        Cognition and Memory: Cognition and memory normal.        Judgment: Judgment normal.     Assessment  Plan  Annual physical exam -  Fasting labs 12/26/18  Flu shot utd given today Tdapand hep B vx held due to cellcept meds Colonoscopy and pap get [place] and [facility] signed today -colonoscopy 07/05/14 hyperplastic rectal polyp MMR  immune  [facility]ammogram [facility] 03/31/2018 reordered  S/p hysterectomyfibroids cervix possibly intact will need to do pelvic and possible pap Former smoker quantify how much and long at f/u  Hep B titer 8.7 and hec neg 12/25/17 Ask HIV in future married to wife  F/u dermatology Dr. Ferrel Logan [facility] management [facility] disc Dr. Tomasa Blase today consider in future for local MD Eye exam 02/2017 ColonoscopyThere is a scanned record from 12/02/2017 under the media tab that has her GI records [facility] 6/1/16colonoscopy with a hyperplastic rectal polyp removed   Insomnia, unspecified type - Plan: eszopiclone (LUNESTA) 2 MG TABS tablet  Prediabetes - Plan: Hemoglobin A1c  Provider: Dr. Olivia Mackie McLean-Scocuzza-Internal Medicine

## 2018-11-30 ENCOUNTER — Encounter: Payer: Self-pay | Admitting: Internal Medicine

## 2018-11-30 DIAGNOSIS — Z6833 Body mass index (BMI) 33.0-33.9, adult: Secondary | ICD-10-CM | POA: Diagnosis not present

## 2019-02-14 DIAGNOSIS — I8310 Varicose veins of unspecified lower extremity with inflammation: Secondary | ICD-10-CM | POA: Diagnosis not present

## 2019-02-14 DIAGNOSIS — L930 Discoid lupus erythematosus: Secondary | ICD-10-CM | POA: Diagnosis not present

## 2019-02-14 DIAGNOSIS — L209 Atopic dermatitis, unspecified: Secondary | ICD-10-CM | POA: Diagnosis not present

## 2019-02-14 DIAGNOSIS — E038 Other specified hypothyroidism: Secondary | ICD-10-CM | POA: Diagnosis not present

## 2019-02-18 DIAGNOSIS — L940 Localized scleroderma [morphea]: Secondary | ICD-10-CM | POA: Diagnosis not present

## 2019-02-18 DIAGNOSIS — Z9884 Bariatric surgery status: Secondary | ICD-10-CM | POA: Diagnosis not present

## 2019-02-18 DIAGNOSIS — L930 Discoid lupus erythematosus: Secondary | ICD-10-CM | POA: Diagnosis not present

## 2019-02-22 DIAGNOSIS — Z9884 Bariatric surgery status: Secondary | ICD-10-CM | POA: Diagnosis not present

## 2019-02-28 DIAGNOSIS — E039 Hypothyroidism, unspecified: Secondary | ICD-10-CM | POA: Diagnosis not present

## 2019-03-03 DIAGNOSIS — G4733 Obstructive sleep apnea (adult) (pediatric): Secondary | ICD-10-CM | POA: Diagnosis not present

## 2019-03-14 DIAGNOSIS — L564 Polymorphous light eruption: Secondary | ICD-10-CM | POA: Diagnosis not present

## 2019-03-14 DIAGNOSIS — L930 Discoid lupus erythematosus: Secondary | ICD-10-CM | POA: Diagnosis not present

## 2019-03-14 DIAGNOSIS — I872 Venous insufficiency (chronic) (peripheral): Secondary | ICD-10-CM | POA: Diagnosis not present

## 2019-03-14 DIAGNOSIS — L940 Localized scleroderma [morphea]: Secondary | ICD-10-CM | POA: Diagnosis not present

## 2019-04-11 DIAGNOSIS — H25813 Combined forms of age-related cataract, bilateral: Secondary | ICD-10-CM | POA: Diagnosis not present

## 2019-04-11 DIAGNOSIS — S63591A Other specified sprain of right wrist, initial encounter: Secondary | ICD-10-CM | POA: Diagnosis not present

## 2019-04-11 DIAGNOSIS — H524 Presbyopia: Secondary | ICD-10-CM | POA: Diagnosis not present

## 2019-04-11 DIAGNOSIS — H04123 Dry eye syndrome of bilateral lacrimal glands: Secondary | ICD-10-CM | POA: Diagnosis not present

## 2019-04-19 DIAGNOSIS — S6291XA Unspecified fracture of right wrist and hand, initial encounter for closed fracture: Secondary | ICD-10-CM | POA: Diagnosis not present

## 2019-04-19 DIAGNOSIS — Z9884 Bariatric surgery status: Secondary | ICD-10-CM | POA: Diagnosis not present

## 2019-04-19 DIAGNOSIS — Z6832 Body mass index (BMI) 32.0-32.9, adult: Secondary | ICD-10-CM | POA: Diagnosis not present

## 2019-04-26 ENCOUNTER — Other Ambulatory Visit: Payer: Self-pay | Admitting: Pain Medicine

## 2019-04-29 ENCOUNTER — Other Ambulatory Visit: Payer: Self-pay | Admitting: Pain Medicine

## 2019-05-03 DIAGNOSIS — S6291XA Unspecified fracture of right wrist and hand, initial encounter for closed fracture: Secondary | ICD-10-CM | POA: Diagnosis not present

## 2019-05-13 DIAGNOSIS — L930 Discoid lupus erythematosus: Secondary | ICD-10-CM | POA: Diagnosis not present

## 2019-05-13 DIAGNOSIS — Z9884 Bariatric surgery status: Secondary | ICD-10-CM | POA: Diagnosis not present

## 2019-05-13 DIAGNOSIS — L940 Localized scleroderma [morphea]: Secondary | ICD-10-CM | POA: Diagnosis not present

## 2019-05-13 DIAGNOSIS — Z79899 Other long term (current) drug therapy: Secondary | ICD-10-CM | POA: Diagnosis not present

## 2019-05-16 DIAGNOSIS — L209 Atopic dermatitis, unspecified: Secondary | ICD-10-CM | POA: Diagnosis not present

## 2019-05-16 DIAGNOSIS — L930 Discoid lupus erythematosus: Secondary | ICD-10-CM | POA: Diagnosis not present

## 2019-05-16 DIAGNOSIS — L940 Localized scleroderma [morphea]: Secondary | ICD-10-CM | POA: Diagnosis not present

## 2019-05-16 DIAGNOSIS — I8310 Varicose veins of unspecified lower extremity with inflammation: Secondary | ICD-10-CM | POA: Diagnosis not present

## 2019-05-18 ENCOUNTER — Other Ambulatory Visit: Payer: Self-pay

## 2019-05-18 DIAGNOSIS — Z1231 Encounter for screening mammogram for malignant neoplasm of breast: Secondary | ICD-10-CM | POA: Diagnosis not present

## 2019-05-18 LAB — HM MAMMOGRAPHY

## 2019-05-20 ENCOUNTER — Other Ambulatory Visit: Payer: Self-pay

## 2019-05-20 ENCOUNTER — Ambulatory Visit (INDEPENDENT_AMBULATORY_CARE_PROVIDER_SITE_OTHER): Payer: BC Managed Care – PPO | Admitting: Internal Medicine

## 2019-05-20 VITALS — BP 122/80 | HR 81 | Temp 97.9°F | Ht 61.0 in | Wt 188.6 lb

## 2019-05-20 DIAGNOSIS — Z13220 Encounter for screening for lipoid disorders: Secondary | ICD-10-CM

## 2019-05-20 DIAGNOSIS — E2839 Other primary ovarian failure: Secondary | ICD-10-CM

## 2019-05-20 DIAGNOSIS — G43909 Migraine, unspecified, not intractable, without status migrainosus: Secondary | ICD-10-CM

## 2019-05-20 DIAGNOSIS — Z1389 Encounter for screening for other disorder: Secondary | ICD-10-CM

## 2019-05-20 DIAGNOSIS — F329 Major depressive disorder, single episode, unspecified: Secondary | ICD-10-CM | POA: Diagnosis not present

## 2019-05-20 DIAGNOSIS — R7303 Prediabetes: Secondary | ICD-10-CM

## 2019-05-20 DIAGNOSIS — E538 Deficiency of other specified B group vitamins: Secondary | ICD-10-CM

## 2019-05-20 DIAGNOSIS — E559 Vitamin D deficiency, unspecified: Secondary | ICD-10-CM

## 2019-05-20 DIAGNOSIS — F419 Anxiety disorder, unspecified: Secondary | ICD-10-CM

## 2019-05-20 DIAGNOSIS — J309 Allergic rhinitis, unspecified: Secondary | ICD-10-CM | POA: Diagnosis not present

## 2019-05-20 DIAGNOSIS — Z1329 Encounter for screening for other suspected endocrine disorder: Secondary | ICD-10-CM

## 2019-05-20 DIAGNOSIS — E611 Iron deficiency: Secondary | ICD-10-CM

## 2019-05-20 MED ORDER — IPRATROPIUM BROMIDE 0.06 % NA SOLN: 2.0000 | Freq: Four times a day (QID) | NASAL | 13 refills | Status: DC

## 2019-05-20 MED ORDER — LEVOCETIRIZINE DIHYDROCHLORIDE 5 MG PO TABS: 5.0000 mg | ORAL_TABLET | Freq: Every evening | ORAL | 3 refills | Status: DC | PRN

## 2019-05-20 MED ORDER — DULOXETINE HCL 20 MG PO CPEP: 20.0000 mg | ORAL_CAPSULE | Freq: Every day | ORAL | 3 refills | Status: DC

## 2019-05-20 MED ORDER — RIZATRIPTAN BENZOATE 10 MG PO TBDP: 10.0000 mg | ORAL_TABLET | ORAL | 11 refills | Status: DC | PRN

## 2019-05-20 MED ORDER — SALINE SPRAY 0.65 % NA SOLN: 2.0000 | NASAL | 12 refills | Status: DC | PRN

## 2019-05-20 NOTE — Patient Instructions (Signed)

## 2019-05-20 NOTE — Progress Notes (Signed)
Chief Complaint  Patient presents with  . Follow-up   F/u  1. Migraines wants Rx Maxalt odt instead of relpax and maxalt tablet which helps better  2. Anxiety/depression 2/2 covid phq 9 score 11 and Gad 7 score 9 out of cymbalta 20 mg x months and wants to try again as if helped covid is stressor 3. PLE on mtx now and [facility] and CBC normal hands were red and swollen on cellcept and it was stopped    Review of Systems  Constitutional: Negative for weight loss.  HENT: Negative for hearing loss.   Eyes: Negative for blurred vision.  Respiratory: Negative for shortness of breath.   Cardiovascular: Negative for chest pain.  Gastrointestinal: Negative for abdominal pain.  Musculoskeletal: Negative for falls.  Skin: Negative for rash.  Neurological: Negative for headaches.  Psychiatric/Behavioral: Positive for depression. The patient is nervous/anxious.    Past Medical History:  Diagnosis Date  . Anxiety   . Arthritis   . Chronic gastritis    egd 04/18/16 + chronic gastritis and EGD 06/16/16 neg H pylori   . Depression   . Discoid lupus   . Eczema   . Fibroids   . Fibromyalgia   . Gallstones    11/25/17 [place]  . Hashimoto's disease   . Hyperlipemia   . Hypothyroidism   . Hypothyroidism   . Kidney stone    left UVJ 2017 CT scan   . Lateral epicondylitis   . Lupus ([place])   . Morphea   . Morphea   . Plantar fasciitis   . PLE (polymorphic light eruption)   . Prediabetes   . Sleep apnea   . Urine incontinence    Past Surgical History:  Procedure Laterality Date  . ABDOMINAL HYSTERECTOMY     uterus and both ovaries out per pt cervix intact h/o PID in 1986, scar tissue, hemorrhagic cysts; multiple pelvic surgeries 1986 to 2006   . APPENDECTOMY    . cervical radiofrequency     in [place]   . EXTRACORPOREAL SHOCK WAVE LITHOTRIPSY Left 12/20/2015   Procedure: EXTRACORPOREAL SHOCK WAVE LITHOTRIPSY (ESWL);  Surgeon: Nickie Retort, MD;  Location: [facility]  ORS;  Service: Urology;  Laterality: Left;  . GALLBLADDER SURGERY     02/2018 Dr. Darnell Level  . LAPAROSCOPIC GASTRIC SLEEVE RESECTION     06/2016 Dr. Darnell Level   . LAPAROSCOPIC TOTAL HYSTERECTOMY  2004  . OTHER SURGICAL HISTORY    . ROTATOR CUFF REPAIR Right    02/2004   Family History  Problem Relation Age of Onset  . Kidney cancer Maternal Grandmother   . Hypertension Maternal Grandmother   . Arthritis Mother   . Hyperlipidemia Mother   . Hypertension Mother   . Heart disease Father   . Early death Father   . Prostate cancer Neg Hx    Social History   Socioeconomic History  . Marital status: Married    Spouse name: Not on file  . Number of children: Not on file  . Years of education: Not on file  . Highest education level: Not on file  Occupational History  . Not on file  Tobacco Use  . Smoking status: Former Research scientist (life sciences)  . Smokeless tobacco: Never Used  Substance and Sexual Activity  . Alcohol use: Yes  . Drug use: No  . Sexual activity: Not Currently    Comment: female   Other Topics Concern  . Not on file  Social History Narrative  [employer] treasurer [employer]    59 y.o twins    Married to wife    No guns, wears seat belt, safe in relationship    [place]   Social Determinants of Health   Financial Resource Strain:   . Difficulty of Paying Living Expenses:   Food Insecurity:   . Worried About Charity fundraiser in the Last Year:   . Arboriculturist in the Last Year:   Transportation Needs:   . Film/video editor (Medical):   Marland Kitchen Lack of Transportation (Non-Medical):   Physical Activity:   . Days of Exercise per Week:   . Minutes of Exercise per Session:   Stress:   . Feeling of Stress :   Social Connections:   . Frequency of Communication with Friends and Family:   . Frequency of Social Gatherings with Friends and Family:   . Attends Religious Services:   . Active Member of Clubs or Organizations:   . Attends Archivist  Meetings:   Marland Kitchen Marital Status:   Intimate Partner Violence:   . Fear of Current or Ex-Partner:   . Emotionally Abused:   Marland Kitchen Physically Abused:   . Sexually Abused:    Current Meds  Medication Sig  . atorvastatin (LIPITOR) 20 MG tablet Take 20 mg by mouth daily.  . Carboxymethylcellulose Sodium (THERATEARS) 0.25 % SOLN Administer 2 drops to both eyes daily as needed.  . clobetasol ointment (TEMOVATE) 0.05 % clobetasol 0.05 % topical ointment  . cyclobenzaprine (FLEXERIL) 5 MG tablet Take 5 mg by mouth 3 (three) times daily as needed for muscle spasms.  . cycloSPORINE (RESTASIS MULTIDOSE) 0.05 % ophthalmic emulsion Administer 2 drops to both eyes every morning.  . eszopiclone (LUNESTA) 2 MG TABS tablet Take 0.5-1 tablets (1-2 mg total) by mouth at bedtime as needed for sleep. Take immediately before bedtime  . fluticasone (FLONASE) 50 MCG/ACT nasal spray fluticasone propionate 50 mcg/actuation nasal spray,suspension  . folic acid (FOLVITE) 1 MG tablet Take 1 mg by mouth daily.  Marland Kitchen gabapentin (NEURONTIN) 100 MG capsule Take 100 mg by mouth.  Marland Kitchen HYDROcodone-acetaminophen (NORCO) 10-325 MG tablet Take 1 tablet by mouth 3 (three) times daily as needed.  . hydrocortisone 2.5 % cream Apply topically.  Marland Kitchen levothyroxine (SYNTHROID, LEVOTHROID) 100 MCG tablet Take 175 mcg by mouth daily before breakfast.   . Methotrexate 2.5 MG/ML SOLN   . morphine (MSIR) 15 MG tablet Take 15 mg by mouth every 4 (four) hours as needed.   . pantoprazole (PROTONIX) 40 MG tablet Take 1 tablet (40 mg total) by mouth daily. At lunch 30 minutes before food  . tamsulosin (FLOMAX) 0.4 MG CAPS capsule Take 1 capsule (0.4 mg total) by mouth daily.  Marland Kitchen topiramate (TOPAMAX) 25 MG capsule Take 25 mg by mouth 2 (two) times daily.   Allergies  Allergen Reactions  . Plaquenil  [Hydroxychloroquine Sulfate] Rash  . Lactose Intolerance (Gi) Diarrhea  . Omega-3 Rash    UV Rays gives her a rash  . Plaquenil [Hydroxychloroquine] Rash    Recent Results (from the past 2160 hour(s))  HM MAMMOGRAPHY     Status: None   Collection Time: 05/18/19 12:00 AM  Result Value Ref Range   HM Mammogram 0-4 Bi-Rad 0-4 Bi-Rad, Self Reported Normal    Comment: [facility]   Objective  Body mass index is 35.64 kg/m. Wt Readings from Last 3 Encounters:  05/20/19 188 lb 9.6 oz (85.5 kg)  11/19/18 190  lb 3.2 oz (86.3 kg)  01/08/18 188 lb 3.2 oz (85.4 kg)   Temp Readings from Last 3 Encounters:  05/20/19 97.9 F (36.6 C) (Oral)  11/19/18 (!) 97.3 F (36.3 C) (Oral)  01/08/18 97.9 F (36.6 C)   BP Readings from Last 3 Encounters:  05/20/19 122/80  11/19/18 106/74  01/08/18 102/68   Pulse Readings from Last 3 Encounters:  05/20/19 81  11/19/18 85  01/08/18 62    Physical Exam Vitals and nursing note reviewed.  Constitutional:      Appearance: Normal appearance. She is well-developed and well-groomed. She is obese.  HENT:     Head: Normocephalic and atraumatic.  Eyes:     Conjunctiva/sclera: Conjunctivae normal.     Pupils: Pupils are equal, round, and reactive to light.  Cardiovascular:     Rate and Rhythm: Normal rate and regular rhythm.     Heart sounds: Normal heart sounds. No murmur.  Pulmonary:     Effort: Pulmonary effort is normal.     Breath sounds: Normal breath sounds.  Skin:    General: Skin is warm and dry.  Neurological:     General: No focal deficit present.     Mental Status: She is alert and oriented to person, place, and time. Mental status is at baseline.     Gait: Gait normal.  Psychiatric:        Attention and Perception: Attention and perception normal.        Mood and Affect: Mood and affect normal.        Speech: Speech normal.        Behavior: Behavior normal. Behavior is cooperative.        Thought Content: Thought content normal.        Cognition and Memory: Cognition and memory normal.        Judgment: Judgment normal.     Assessment  Plan  Anxiety and depression - Plan: DULoxetine  (CYMBALTA) 20 MG capsule qd   Allergic rhinitis, unspecified seasonality, unspecified trigger - Plan: ipratropium (ATROVENT) 0.06 % nasal spray, levocetirizine (XYZAL) 5 MG tablet, sodium chloride (OCEAN) 0.65 % SOLN nasal spray  Estrogen deficiency - Plan: DG Bone Density  Migraine without status migrainosus, not intractable, unspecified migraine type - Plan: rizatriptan (MAXALT-MLT) 10 MG disintegrating tablet  B12 deficiency - Plan: B12  Prediabetes - Plan: Hemoglobin A1c  HM Fasting labs  Flu shot utd covid 2/2  Tdapand hep B vx held due to cellcept meds Colonoscopy and pap get [place], [place] and [facility] [place] [facility] -colonoscopy 07/05/14 hyperplastic rectal polyp MMR immune  Mammograms[place] 4/14/2021neg  S/p hysterectomyfibroids cervix possibly intact will need to do pelvic and possible pap in future if h/o abnormal pap   Former smoker quantify how much and long at f/u DEXA ordered due 07/2019  Hep B titer 8.7 and hec neg 12/25/17 Ask HIV in future married to wife  F/u dermatology Dr. Kellie Shropshire [facility] Tomasa Blase today consider in future for local MD Eye exam 02/2017 ColonoscopyThere is a scanned record from 12/02/2017 under the media tab that has her GI records from the [facility] 6/1/16colonoscopy with a hyperplastic rectal polyp removed  Provider: Dr. Olivia Mackie McLean-Scocuzza-Internal Medicine

## 2019-05-23 ENCOUNTER — Encounter: Payer: Self-pay | Admitting: Internal Medicine

## 2019-05-24 DIAGNOSIS — S6291XA Unspecified fracture of right wrist and hand, initial encounter for closed fracture: Secondary | ICD-10-CM | POA: Diagnosis not present

## 2019-06-23 DIAGNOSIS — Z0189 Encounter for other specified special examinations: Secondary | ICD-10-CM | POA: Diagnosis not present

## 2019-06-23 DIAGNOSIS — R142 Eructation: Secondary | ICD-10-CM | POA: Diagnosis not present

## 2019-08-15 DIAGNOSIS — L930 Discoid lupus erythematosus: Secondary | ICD-10-CM | POA: Diagnosis not present

## 2019-08-15 DIAGNOSIS — L564 Polymorphous light eruption: Secondary | ICD-10-CM | POA: Diagnosis not present

## 2019-08-15 DIAGNOSIS — I872 Venous insufficiency (chronic) (peripheral): Secondary | ICD-10-CM | POA: Diagnosis not present

## 2019-08-15 DIAGNOSIS — L209 Atopic dermatitis, unspecified: Secondary | ICD-10-CM | POA: Diagnosis not present

## 2019-08-29 DIAGNOSIS — Z713 Dietary counseling and surveillance: Secondary | ICD-10-CM | POA: Diagnosis not present

## 2019-08-29 DIAGNOSIS — Z6834 Body mass index (BMI) 34.0-34.9, adult: Secondary | ICD-10-CM | POA: Diagnosis not present

## 2019-09-14 DIAGNOSIS — M19012 Primary osteoarthritis, left shoulder: Secondary | ICD-10-CM | POA: Diagnosis not present

## 2019-09-14 DIAGNOSIS — M19011 Primary osteoarthritis, right shoulder: Secondary | ICD-10-CM | POA: Diagnosis not present

## 2019-09-20 DIAGNOSIS — L03116 Cellulitis of left lower limb: Secondary | ICD-10-CM | POA: Diagnosis not present

## 2019-09-23 ENCOUNTER — Encounter: Payer: Self-pay | Admitting: Internal Medicine

## 2019-09-23 ENCOUNTER — Other Ambulatory Visit: Payer: Self-pay

## 2019-09-23 ENCOUNTER — Ambulatory Visit (INDEPENDENT_AMBULATORY_CARE_PROVIDER_SITE_OTHER): Payer: BC Managed Care – PPO | Admitting: Internal Medicine

## 2019-09-23 VITALS — BP 104/72 | HR 82 | Temp 98.2°F | Ht 61.0 in | Wt 197.4 lb

## 2019-09-23 DIAGNOSIS — Z1389 Encounter for screening for other disorder: Secondary | ICD-10-CM

## 2019-09-23 DIAGNOSIS — F419 Anxiety disorder, unspecified: Secondary | ICD-10-CM

## 2019-09-23 DIAGNOSIS — L0390 Cellulitis, unspecified: Secondary | ICD-10-CM | POA: Diagnosis not present

## 2019-09-23 DIAGNOSIS — L03116 Cellulitis of left lower limb: Secondary | ICD-10-CM | POA: Diagnosis not present

## 2019-09-23 DIAGNOSIS — Z13220 Encounter for screening for lipoid disorders: Secondary | ICD-10-CM

## 2019-09-23 DIAGNOSIS — F32A Depression, unspecified: Secondary | ICD-10-CM

## 2019-09-23 DIAGNOSIS — G4700 Insomnia, unspecified: Secondary | ICD-10-CM

## 2019-09-23 DIAGNOSIS — E039 Hypothyroidism, unspecified: Secondary | ICD-10-CM

## 2019-09-23 DIAGNOSIS — E669 Obesity, unspecified: Secondary | ICD-10-CM

## 2019-09-23 DIAGNOSIS — E559 Vitamin D deficiency, unspecified: Secondary | ICD-10-CM

## 2019-09-23 DIAGNOSIS — R739 Hyperglycemia, unspecified: Secondary | ICD-10-CM

## 2019-09-23 DIAGNOSIS — Z87898 Personal history of other specified conditions: Secondary | ICD-10-CM

## 2019-09-23 DIAGNOSIS — E538 Deficiency of other specified B group vitamins: Secondary | ICD-10-CM

## 2019-09-23 DIAGNOSIS — Z9889 Other specified postprocedural states: Secondary | ICD-10-CM

## 2019-09-23 DIAGNOSIS — F329 Major depressive disorder, single episode, unspecified: Secondary | ICD-10-CM

## 2019-09-23 DIAGNOSIS — Z1211 Encounter for screening for malignant neoplasm of colon: Secondary | ICD-10-CM

## 2019-09-23 DIAGNOSIS — E611 Iron deficiency: Secondary | ICD-10-CM

## 2019-09-23 DIAGNOSIS — B373 Candidiasis of vulva and vagina: Secondary | ICD-10-CM | POA: Diagnosis not present

## 2019-09-23 DIAGNOSIS — Z0000 Encounter for general adult medical examination without abnormal findings: Secondary | ICD-10-CM

## 2019-09-23 DIAGNOSIS — B3731 Acute candidiasis of vulva and vagina: Secondary | ICD-10-CM

## 2019-09-23 DIAGNOSIS — K635 Polyp of colon: Secondary | ICD-10-CM

## 2019-09-23 MED ORDER — DOXYCYCLINE HYCLATE 100 MG PO TABS: 100.0000 mg | ORAL_TABLET | Freq: Two times a day (BID) | ORAL | 0 refills | Status: DC

## 2019-09-23 MED ORDER — ESZOPICLONE 2 MG PO TABS: 1.0000 mg | ORAL_TABLET | Freq: Every evening | ORAL | 5 refills | Status: DC | PRN

## 2019-09-23 MED ORDER — FLUCONAZOLE 150 MG PO TABS: 150.0000 mg | ORAL_TABLET | Freq: Once | ORAL | 0 refills | Status: AC

## 2019-09-23 MED ORDER — MUPIROCIN 2 % EX OINT: 1.0000 "application " | TOPICAL_OINTMENT | Freq: Three times a day (TID) | CUTANEOUS | 0 refills | Status: DC
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
## 2019-09-23 NOTE — Progress Notes (Signed)
Pre visit review using our clinic review tool, if applicable. No additional management support is needed unless otherwise documented below in the visit note. 

## 2019-09-23 NOTE — Patient Instructions (Addendum)
chlorhexadine or hibiclens antibacterial soap otc   Clotrimazole cream 2x per day with hydrocortisone 1% 2x per day  Gold bond talc free powder   Tdap due 04/18/13  Up to date   Tdap (Tetanus, Diphtheria, Pertussis) Vaccine: What You Need to Know 1. Why get vaccinated? Tdap vaccine can prevent tetanus, diphtheria, and pertussis. Diphtheria and pertussis spread from person to person. Tetanus enters the body through cuts or wounds.  TETANUS (T) causes painful stiffening of the muscles. Tetanus can lead to serious health problems, including being unable to open the mouth, having trouble swallowing and breathing, or death.  DIPHTHERIA (D) can lead to difficulty breathing, heart failure, paralysis, or death.  PERTUSSIS (aP), also known as "whooping cough," can cause uncontrollable, violent coughing which makes it hard to breathe, eat, or drink. Pertussis can be extremely serious in babies and young children, causing pneumonia, convulsions, brain damage, or death. In teens and adults, it can cause weight loss, loss of bladder control, passing out, and rib fractures from severe coughing. 2. Tdap vaccine Tdap is only for children 7 years and older, adolescents, and adults.  Adolescents should receive a single dose of Tdap, preferably at age 62 or 73 years. Pregnant women should get a dose of Tdap during every pregnancy, to protect the newborn from pertussis. Infants are most at risk for severe, life-threatening complications from pertussis. Adults who have never received Tdap should get a dose of Tdap. Also, adults should receive a booster dose every 10 years, or earlier in the case of a severe and dirty wound or burn. Booster doses can be either Tdap or Td (a different vaccine that protects against tetanus and diphtheria but not pertussis). Tdap may be given at the same time as other vaccines. 3. Talk with your health care provider Tell your vaccine provider if the person getting the  vaccine:  Has had an allergic reaction after a previous dose of any vaccine that protects against tetanus, diphtheria, or pertussis, or has any severe, life-threatening allergies.  Has had a coma, decreased level of consciousness, or prolonged seizures within 7 days after a previous dose of any pertussis vaccine (DTP, DTaP, or Tdap).  Has seizures or another nervous system problem.  Has ever had Guillain-Barr Syndrome (also called GBS).  Has had severe pain or swelling after a previous dose of any vaccine that protects against tetanus or diphtheria. In some cases, your health care provider may decide to postpone Tdap vaccination to a future visit.  People with minor illnesses, such as a cold, may be vaccinated. People who are moderately or severely ill should usually wait until they recover before getting Tdap vaccine.  Your health care provider can give you more information. 4. Risks of a vaccine reaction  Pain, redness, or swelling where the shot was given, mild fever, headache, feeling tired, and nausea, vomiting, diarrhea, or stomachache sometimes happen after Tdap vaccine. People sometimes faint after medical procedures, including vaccination. Tell your provider if you feel dizzy or have vision changes or ringing in the ears.  As with any medicine, there is a very remote chance of a vaccine causing a severe allergic reaction, other serious injury, or death. 5. What if there is a serious problem? An allergic reaction could occur after the vaccinated person leaves the clinic. If you see signs of a severe allergic reaction (hives, swelling of the face and throat, difficulty breathing, a fast heartbeat, dizziness, or weakness), call 9-1-1 and get the person to the nearest hospital.  For other signs that concern you, call your health care provider.  Adverse reactions should be reported to the Vaccine Adverse Event Reporting System (VAERS). Your health care provider will usually file this  report, or you can do it yourself. Visit the VAERS website at www.vaers.SamedayNews.es or call 562-191-1227. VAERS is only for reporting reactions, and VAERS staff do not give medical advice. 6. The National Vaccine Injury Compensation Program The [employer] Vaccine Injury Compensation Program (VICP) is a federal program that was created to compensate people who may have been injured by certain vaccines. Visit the VICP website at GoldCloset.com.ee or call (939) 816-7742 to learn about the program and about filing a claim. There is a time limit to file a claim for compensation. 7. How can I learn more?  Ask your health care provider.  Call your local or state health department.  Contact the Centers for Disease Control and Prevention (CDC): ? Call 4170190090 (1-800-CDC-INFO) or ? Visit CDC's website at http://hunter.com/ Vaccine Information Statement Tdap (Tetanus, Diphtheria, Pertussis) Vaccine (05/05/2018) This information is not intended to replace advice given to you by your health care provider. Make sure you discuss any questions you have with your health care provider. Document Revised: 05/14/2018 Document Reviewed: 05/17/2018 Elsevier Patient Education  [place].

## 2019-09-29 DIAGNOSIS — R739 Hyperglycemia, unspecified: Secondary | ICD-10-CM | POA: Diagnosis not present

## 2019-09-29 DIAGNOSIS — E611 Iron deficiency: Secondary | ICD-10-CM | POA: Diagnosis not present

## 2019-09-29 DIAGNOSIS — Z0000 Encounter for general adult medical examination without abnormal findings: Secondary | ICD-10-CM | POA: Diagnosis not present

## 2019-09-29 DIAGNOSIS — E559 Vitamin D deficiency, unspecified: Secondary | ICD-10-CM | POA: Diagnosis not present

## 2019-09-29 DIAGNOSIS — Z9889 Other specified postprocedural states: Secondary | ICD-10-CM | POA: Diagnosis not present

## 2019-09-29 DIAGNOSIS — Z1389 Encounter for screening for other disorder: Secondary | ICD-10-CM | POA: Diagnosis not present

## 2019-09-29 DIAGNOSIS — Z13220 Encounter for screening for lipoid disorders: Secondary | ICD-10-CM | POA: Diagnosis not present

## 2019-09-29 DIAGNOSIS — E538 Deficiency of other specified B group vitamins: Secondary | ICD-10-CM | POA: Diagnosis not present

## 2019-09-29 DIAGNOSIS — E039 Hypothyroidism, unspecified: Secondary | ICD-10-CM | POA: Diagnosis not present

## 2019-09-30 LAB — CBC WITH DIFFERENTIAL/PLATELET
Basophils Absolute: 0 10*3/uL (ref 0.0–0.2)
Basos: 1 %
EOS (ABSOLUTE): 0.1 10*3/uL (ref 0.0–0.4)
Eos: 2 %
Hematocrit: 40.1 % (ref 34.0–46.6)
Hemoglobin: 13.6 g/dL (ref 11.1–15.9)
Immature Grans (Abs): 0 10*3/uL (ref 0.0–0.1)
Immature Granulocytes: 0 %
Lymphocytes Absolute: 2.1 10*3/uL (ref 0.7–3.1)
Lymphs: 31 %
MCH: 29.7 pg (ref 26.6–33.0)
MCHC: 33.9 g/dL (ref 31.5–35.7)
MCV: 88 fL (ref 79–97)
Monocytes Absolute: 0.5 10*3/uL (ref 0.1–0.9)
Monocytes: 8 %
Neutrophils Absolute: 3.9 10*3/uL (ref 1.4–7.0)
Neutrophils: 58 %
Platelets: 228 10*3/uL (ref 150–450)
RBC: 4.58 x10E6/uL (ref 3.77–5.28)
RDW: 13.6 % (ref 11.7–15.4)
WBC: 6.6 10*3/uL (ref 3.4–10.8)

## 2019-09-30 LAB — COMPREHENSIVE METABOLIC PANEL
ALT: 17 IU/L (ref 0–32)
AST: 13 IU/L (ref 0–40)
Albumin/Globulin Ratio: 1.5 (ref 1.2–2.2)
Albumin: 4.1 g/dL (ref 3.8–4.9)
Alkaline Phosphatase: 132 IU/L — ABNORMAL HIGH (ref 48–121)
BUN/Creatinine Ratio: 21 (ref 9–23)
BUN: 18 mg/dL (ref 6–24)
Bilirubin Total: 0.4 mg/dL (ref 0.0–1.2)
CO2: 23 mmol/L (ref 20–29)
Calcium: 8.9 mg/dL (ref 8.7–10.2)
Chloride: 107 mmol/L — ABNORMAL HIGH (ref 96–106)
Creatinine, Ser: 0.85 mg/dL (ref 0.57–1.00)
GFR calc Af Amer: 87 mL/min/{1.73_m2} (ref >59)
GFR calc non Af Amer: 76 mL/min/{1.73_m2} (ref >59)
Globulin, Total: 2.7 g/dL (ref 1.5–4.5)
Glucose: 108 mg/dL — ABNORMAL HIGH (ref 65–99)
Potassium: 4 mmol/L (ref 3.5–5.2)
Sodium: 146 mmol/L — ABNORMAL HIGH (ref 134–144)
Total Protein: 6.8 g/dL (ref 6.0–8.5)

## 2019-09-30 LAB — HEMOGLOBIN A1C
Est. average glucose Bld gHb Est-mCnc: 126 mg/dL
Hgb A1c MFr Bld: 6 % — ABNORMAL HIGH (ref 4.8–5.6)

## 2019-09-30 LAB — VITAMIN D 25 HYDROXY (VIT D DEFICIENCY, FRACTURES): Vit D, 25-Hydroxy: 50 ng/mL (ref 30.0–100.0)

## 2019-09-30 LAB — LIPID PANEL
Chol/HDL Ratio: 3 ratio (ref 0.0–4.4)
Cholesterol, Total: 181 mg/dL (ref 100–199)
HDL: 60 mg/dL (ref >39)
LDL Chol Calc (NIH): 103 mg/dL — ABNORMAL HIGH (ref 0–99)
Triglycerides: 98 mg/dL (ref 0–149)
VLDL Cholesterol Cal: 18 mg/dL (ref 5–40)

## 2019-09-30 LAB — VITAMIN B12: Vitamin B-12: 1207 pg/mL (ref 232–1245)

## 2019-09-30 LAB — IRON,TIBC AND FERRITIN PANEL
Ferritin: 139 ng/mL (ref 15–150)
Iron Saturation: 33 % (ref 15–55)
Iron: 79 ug/dL (ref 27–159)
Total Iron Binding Capacity: 238 ug/dL — ABNORMAL LOW (ref 250–450)
UIBC: 159 ug/dL (ref 131–425)

## 2019-09-30 LAB — URINALYSIS, ROUTINE W REFLEX MICROSCOPIC
Bilirubin, UA: NEGATIVE
Glucose, UA: NEGATIVE
Ketones, UA: NEGATIVE
Leukocytes,UA: NEGATIVE
Nitrite, UA: NEGATIVE
Protein,UA: NEGATIVE
RBC, UA: NEGATIVE
Specific Gravity, UA: 1.019 (ref 1.005–1.030)
Urobilinogen, Ur: 0.2 mg/dL (ref 0.2–1.0)
pH, UA: 7 (ref 5.0–7.5)

## 2019-09-30 LAB — TSH: TSH: 0.269 u[IU]/mL — ABNORMAL LOW (ref 0.450–4.500)

## 2019-10-16 ENCOUNTER — Encounter: Payer: Self-pay | Admitting: Internal Medicine

## 2019-10-16 DIAGNOSIS — E611 Iron deficiency: Secondary | ICD-10-CM | POA: Insufficient documentation

## 2019-10-19 ENCOUNTER — Telehealth: Payer: Self-pay | Admitting: Internal Medicine

## 2019-10-19 ENCOUNTER — Encounter: Payer: Self-pay | Admitting: Internal Medicine

## 2019-10-19 NOTE — Telephone Encounter (Signed)
Patient returned office phone call for lab results. 

## 2019-10-19 NOTE — Telephone Encounter (Signed)
Patient informed see result notes

## 2019-10-20 DIAGNOSIS — R142 Eructation: Secondary | ICD-10-CM | POA: Diagnosis not present

## 2019-10-20 DIAGNOSIS — K5900 Constipation, unspecified: Secondary | ICD-10-CM | POA: Diagnosis not present

## 2019-10-20 DIAGNOSIS — R197 Diarrhea, unspecified: Secondary | ICD-10-CM | POA: Diagnosis not present

## 2019-10-24 DIAGNOSIS — T50905A Adverse effect of unspecified drugs, medicaments and biological substances, initial encounter: Secondary | ICD-10-CM | POA: Diagnosis not present

## 2019-10-24 DIAGNOSIS — R21 Rash and other nonspecific skin eruption: Secondary | ICD-10-CM | POA: Diagnosis not present

## 2019-11-01 DIAGNOSIS — M19011 Primary osteoarthritis, right shoulder: Secondary | ICD-10-CM | POA: Diagnosis not present

## 2019-11-01 DIAGNOSIS — M62511 Muscle wasting and atrophy, not elsewhere classified, right shoulder: Secondary | ICD-10-CM | POA: Diagnosis not present

## 2019-11-01 DIAGNOSIS — M19012 Primary osteoarthritis, left shoulder: Secondary | ICD-10-CM | POA: Diagnosis not present

## 2019-11-01 DIAGNOSIS — R197 Diarrhea, unspecified: Secondary | ICD-10-CM | POA: Diagnosis not present

## 2019-11-01 DIAGNOSIS — M25711 Osteophyte, right shoulder: Secondary | ICD-10-CM | POA: Diagnosis not present

## 2019-11-02 ENCOUNTER — Telehealth: Payer: Self-pay | Admitting: Internal Medicine

## 2019-11-02 NOTE — Telephone Encounter (Signed)
Call her dermatologist for appt asap

## 2019-11-02 NOTE — Telephone Encounter (Signed)
Please advise 

## 2019-11-02 NOTE — Telephone Encounter (Signed)
Patient called in stated that she has a big rash on her neck she went to the [facility] they gave her some prednisone but did not work it is starting to spread again wanted to know what she should do.

## 2019-11-03 NOTE — Telephone Encounter (Signed)
Patient informed and verbalized understanding.  States that she has been trying to get in with dermatology. Patient has a history of multiple skin conditions that need to be managed by specialty.   Patient will try to walk in to [facility] on Monday. She will send a picture of the rash through mychart as she was wanting advise on how to manage until Monday.

## 2019-11-08 ENCOUNTER — Other Ambulatory Visit: Payer: Self-pay | Admitting: Internal Medicine

## 2019-11-08 DIAGNOSIS — L309 Dermatitis, unspecified: Secondary | ICD-10-CM

## 2019-11-08 MED ORDER — HYDROCORTISONE 2.5 % EX CREA: TOPICAL_CREAM | Freq: Two times a day (BID) | CUTANEOUS | 0 refills | Status: AC

## 2019-11-10 DIAGNOSIS — Z9889 Other specified postprocedural states: Secondary | ICD-10-CM | POA: Diagnosis not present

## 2019-11-10 DIAGNOSIS — L930 Discoid lupus erythematosus: Secondary | ICD-10-CM | POA: Diagnosis not present

## 2019-11-10 DIAGNOSIS — E785 Hyperlipidemia, unspecified: Secondary | ICD-10-CM | POA: Diagnosis not present

## 2019-11-10 DIAGNOSIS — E669 Obesity, unspecified: Secondary | ICD-10-CM | POA: Diagnosis not present

## 2019-11-10 DIAGNOSIS — Z23 Encounter for immunization: Secondary | ICD-10-CM | POA: Diagnosis not present

## 2019-11-18 DIAGNOSIS — Z79899 Other long term (current) drug therapy: Secondary | ICD-10-CM | POA: Diagnosis not present

## 2019-11-18 DIAGNOSIS — L940 Localized scleroderma [morphea]: Secondary | ICD-10-CM | POA: Diagnosis not present

## 2019-11-18 DIAGNOSIS — L930 Discoid lupus erythematosus: Secondary | ICD-10-CM | POA: Diagnosis not present

## 2019-11-18 DIAGNOSIS — Z01818 Encounter for other preprocedural examination: Secondary | ICD-10-CM | POA: Diagnosis not present

## 2019-11-25 ENCOUNTER — Encounter: Payer: BC Managed Care – PPO | Admitting: Internal Medicine

## 2019-12-12 DIAGNOSIS — L940 Localized scleroderma [morphea]: Secondary | ICD-10-CM | POA: Diagnosis not present

## 2019-12-12 DIAGNOSIS — L930 Discoid lupus erythematosus: Secondary | ICD-10-CM | POA: Diagnosis not present

## 2019-12-12 DIAGNOSIS — I872 Venous insufficiency (chronic) (peripheral): Secondary | ICD-10-CM | POA: Diagnosis not present

## 2019-12-12 DIAGNOSIS — L209 Atopic dermatitis, unspecified: Secondary | ICD-10-CM | POA: Diagnosis not present

## 2019-12-27 ENCOUNTER — Ambulatory Visit: Payer: Non-veteran care | Admitting: Gastroenterology

## 2020-01-02 DIAGNOSIS — M75122 Complete rotator cuff tear or rupture of left shoulder, not specified as traumatic: Secondary | ICD-10-CM | POA: Diagnosis not present

## 2020-01-05 DIAGNOSIS — M47816 Spondylosis without myelopathy or radiculopathy, lumbar region: Secondary | ICD-10-CM | POA: Diagnosis not present

## 2020-01-05 DIAGNOSIS — M47814 Spondylosis without myelopathy or radiculopathy, thoracic region: Secondary | ICD-10-CM | POA: Diagnosis not present

## 2020-01-12 ENCOUNTER — Encounter: Payer: BC Managed Care – PPO | Admitting: Internal Medicine

## 2020-01-18 DIAGNOSIS — K219 Gastro-esophageal reflux disease without esophagitis: Secondary | ICD-10-CM | POA: Diagnosis not present

## 2020-01-28 IMAGING — US US THYROID
1 series · 14 of 25 positions shown · non-contrast
Comparison: None.

CLINICAL DATA: Goiter.  Hypothyroidism.

EXAM:
THYROID ULTRASOUND
TECHNIQUE: Ultrasound examination of the thyroid gland and adjacent soft
tissues was performed.

[Series 1: us thyroid · 26 acquisitions, 14 frames shown]
[im 1/26]
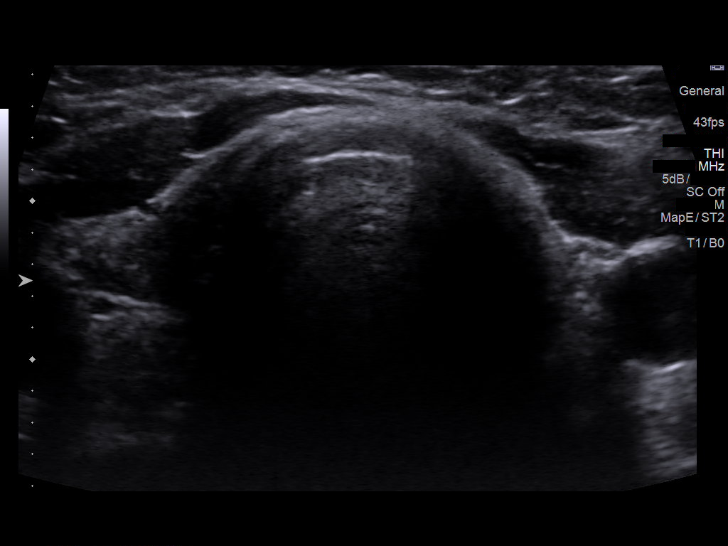
[im 3/26]
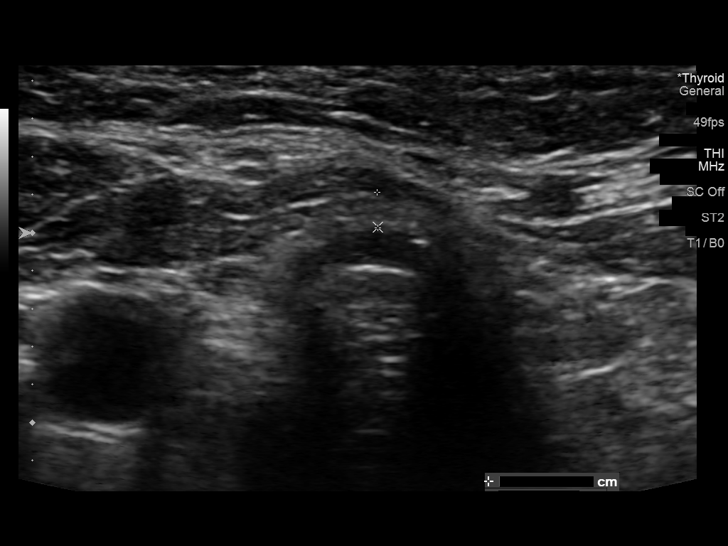
[im 5/26]
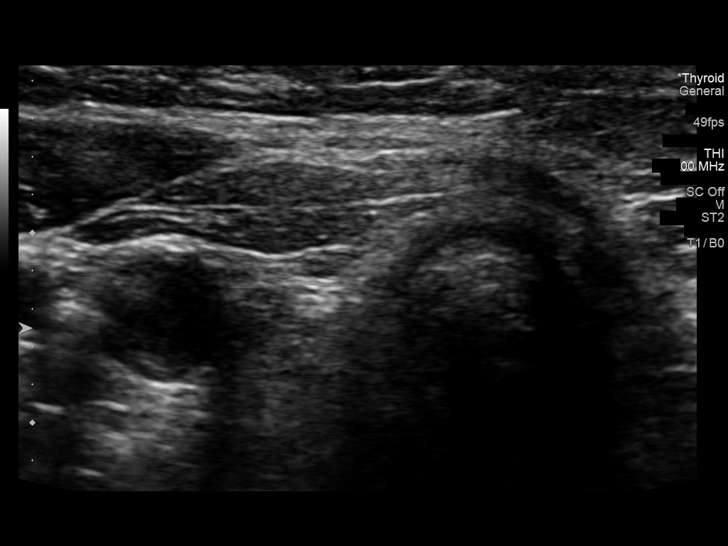
[im 7/26]
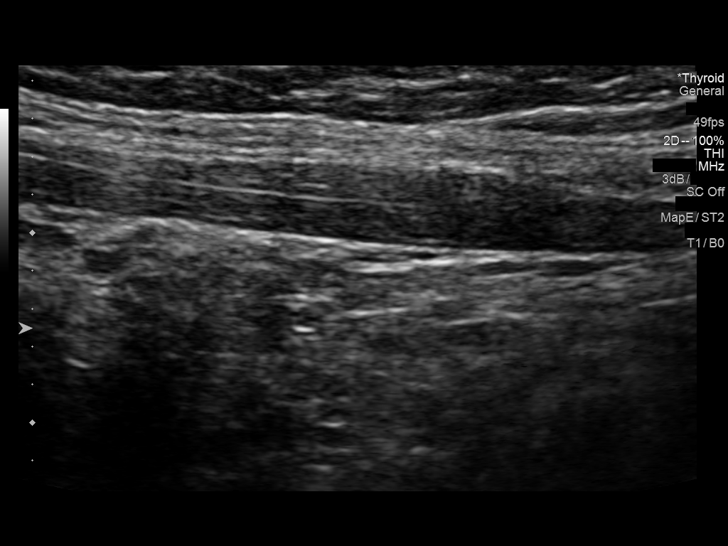
[im 9/26]
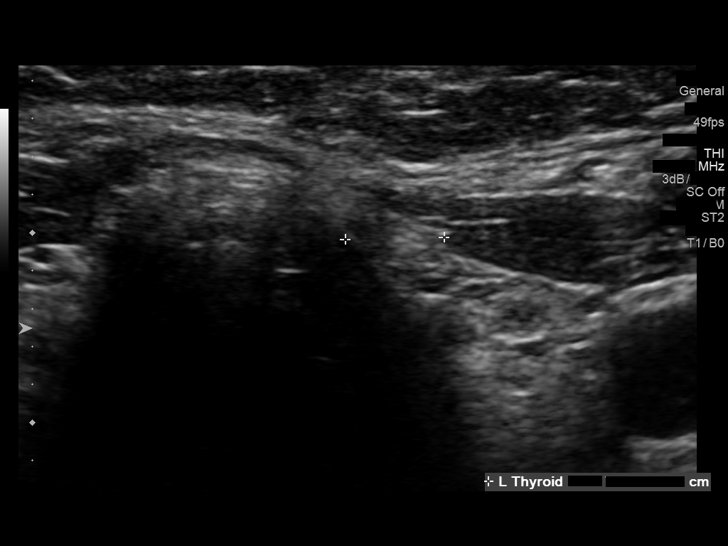
[im 10/26]
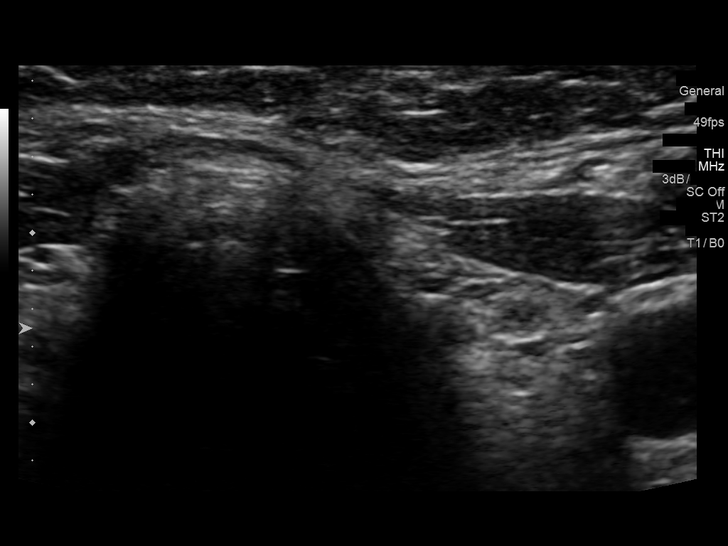
[im 12/26]
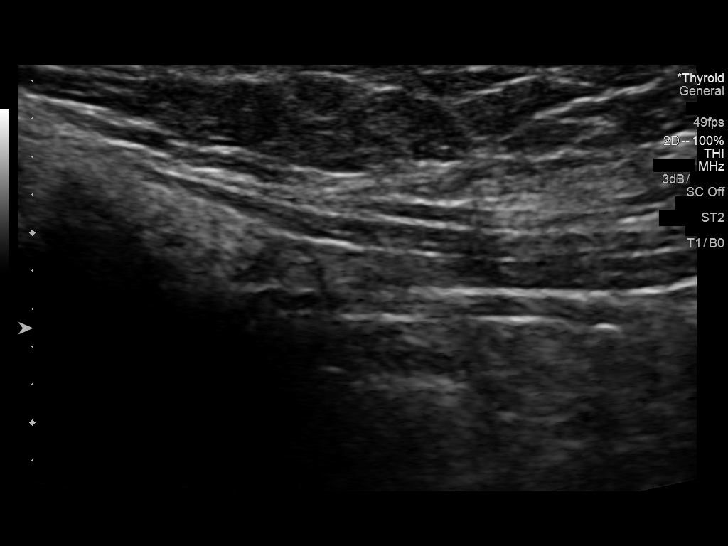
[im 14/26]
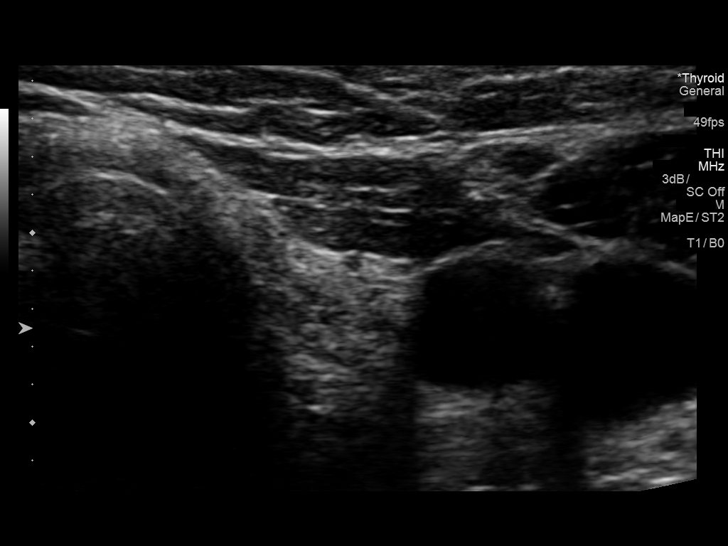
[im 16/26]
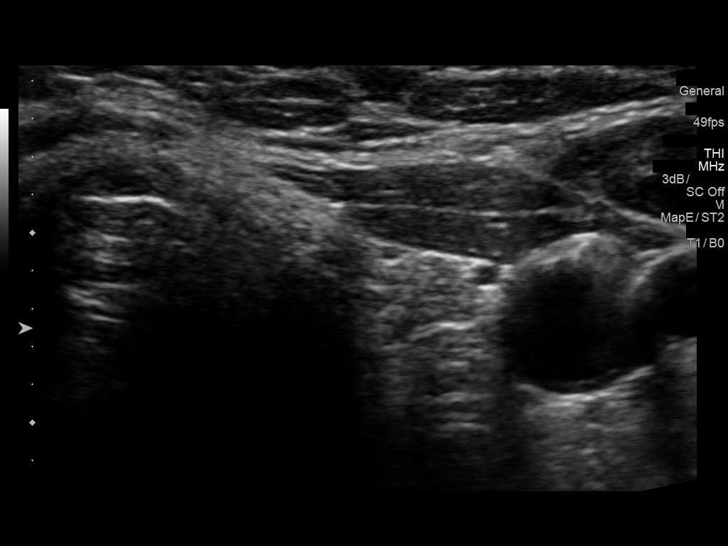
[im 17/26]
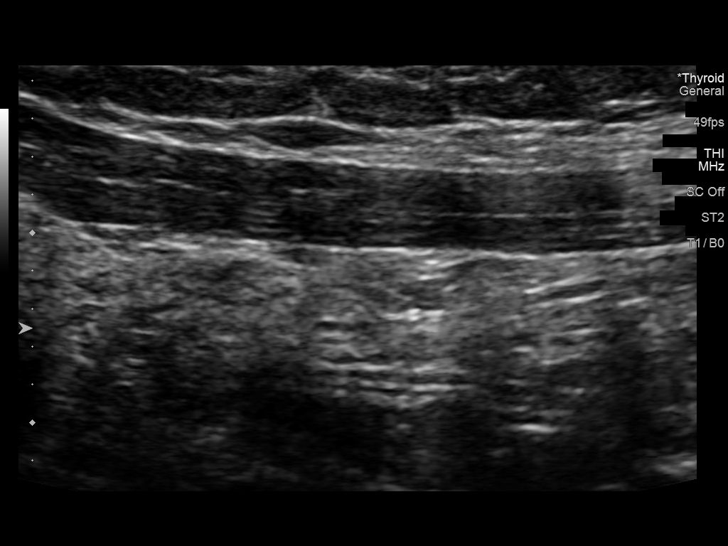
[im 19/26]
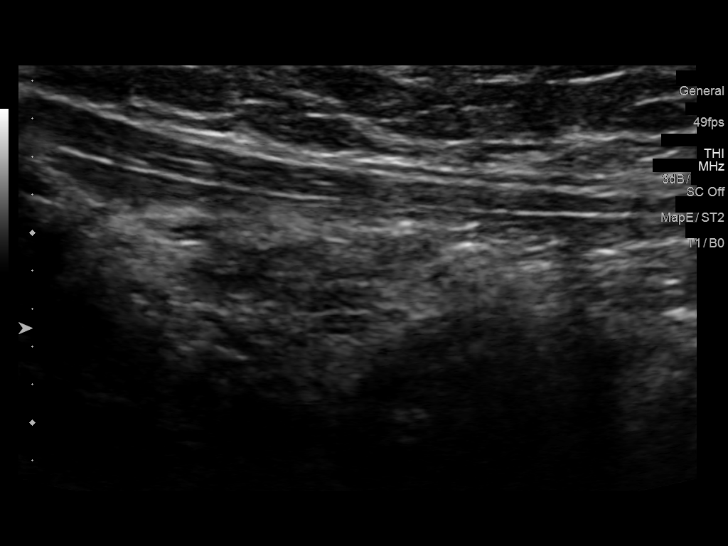
[im 21/26]
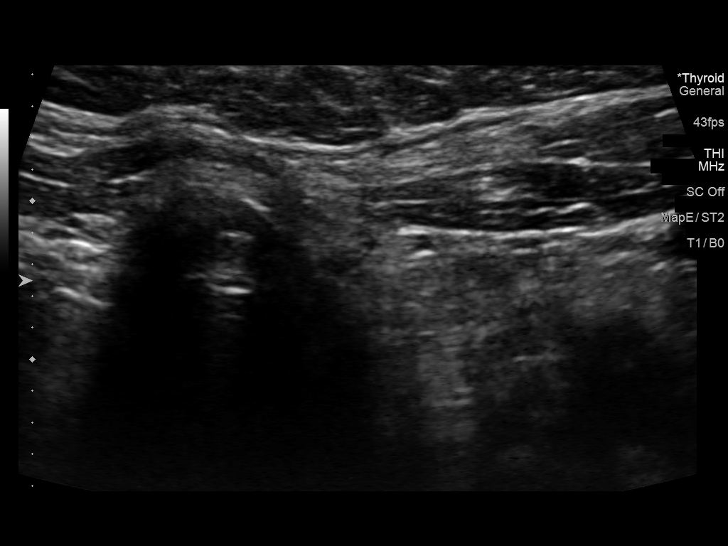
[im 23/26]
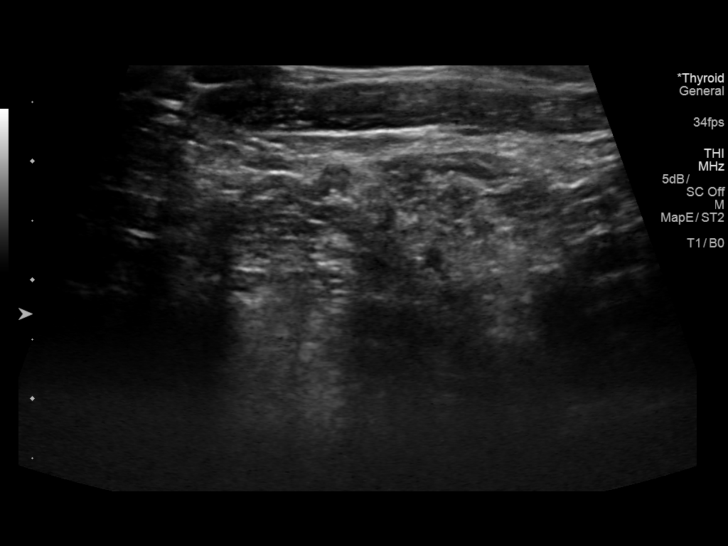
[im 26/26]
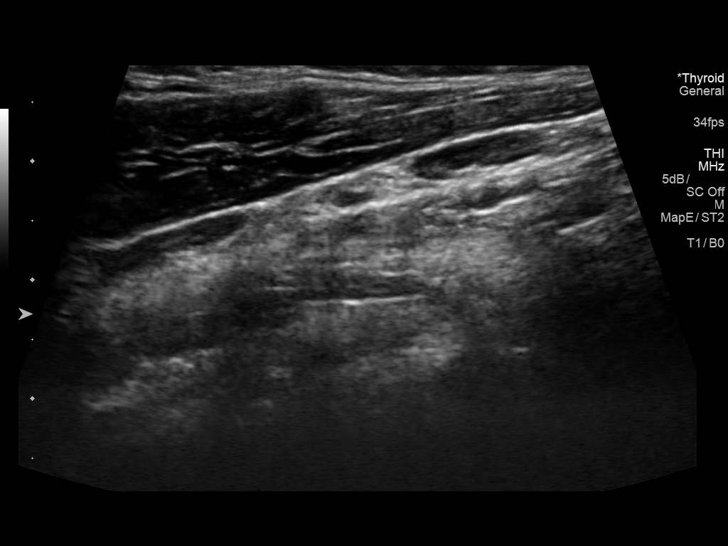

[14 of 25 positions shown; findings below may reference images not displayed]

FINDINGS: Parenchymal Echotexture: Moderately heterogenous

Isthmus: 0.2 cm

Right lobe: No visualized thyroid tissue in the right thyroid bed.

Left lobe: 0.9 x 0.2 x 0.5 cm

_________________________________________________________

Estimated total number of nodules >/= 1 cm: 0

Number of spongiform nodules >/=  2 cm not described below (TR1): 0

Number of mixed cystic and solid nodules >/= 1.5 cm not described
below (TR2): 0

_________________________________________________________

No discrete nodules are seen within the thyroid gland.
IMPRESSION: There is no tissue in the right thyroid bed. There is a small amount
of tissue in the left thyroid bed as described above. There are no
suspicious nodules which meet criteria for biopsy nor follow-up.

The above is in keeping with the ACR TI-RADS recommendations - [HOSPITAL] 6933;[DATE].

## 2020-01-30 DIAGNOSIS — K219 Gastro-esophageal reflux disease without esophagitis: Secondary | ICD-10-CM | POA: Diagnosis not present

## 2020-02-07 DIAGNOSIS — M75122 Complete rotator cuff tear or rupture of left shoulder, not specified as traumatic: Secondary | ICD-10-CM | POA: Diagnosis not present

## 2020-02-07 DIAGNOSIS — M25512 Pain in left shoulder: Secondary | ICD-10-CM | POA: Diagnosis not present

## 2020-02-13 ENCOUNTER — Ambulatory Visit: Payer: Non-veteran care | Admitting: Gastroenterology

## 2020-02-13 DIAGNOSIS — K219 Gastro-esophageal reflux disease without esophagitis: Secondary | ICD-10-CM | POA: Diagnosis not present

## 2020-02-13 DIAGNOSIS — R197 Diarrhea, unspecified: Secondary | ICD-10-CM | POA: Diagnosis not present

## 2020-02-13 DIAGNOSIS — K5900 Constipation, unspecified: Secondary | ICD-10-CM | POA: Diagnosis not present

## 2020-02-13 DIAGNOSIS — Z01818 Encounter for other preprocedural examination: Secondary | ICD-10-CM | POA: Diagnosis not present

## 2020-02-15 DIAGNOSIS — Z903 Acquired absence of stomach [part of]: Secondary | ICD-10-CM | POA: Diagnosis not present

## 2020-02-15 DIAGNOSIS — K219 Gastro-esophageal reflux disease without esophagitis: Secondary | ICD-10-CM | POA: Diagnosis not present

## 2020-02-16 DIAGNOSIS — M75122 Complete rotator cuff tear or rupture of left shoulder, not specified as traumatic: Secondary | ICD-10-CM | POA: Diagnosis not present

## 2020-02-16 DIAGNOSIS — M25512 Pain in left shoulder: Secondary | ICD-10-CM | POA: Diagnosis not present

## 2020-02-27 DIAGNOSIS — D120 Benign neoplasm of cecum: Secondary | ICD-10-CM | POA: Diagnosis not present

## 2020-02-27 DIAGNOSIS — Z9884 Bariatric surgery status: Secondary | ICD-10-CM | POA: Diagnosis not present

## 2020-02-27 DIAGNOSIS — K219 Gastro-esophageal reflux disease without esophagitis: Secondary | ICD-10-CM | POA: Diagnosis not present

## 2020-02-27 DIAGNOSIS — K5900 Constipation, unspecified: Secondary | ICD-10-CM | POA: Diagnosis not present

## 2020-02-27 DIAGNOSIS — K449 Diaphragmatic hernia without obstruction or gangrene: Secondary | ICD-10-CM | POA: Diagnosis not present

## 2020-02-27 DIAGNOSIS — R197 Diarrhea, unspecified: Secondary | ICD-10-CM | POA: Diagnosis not present

## 2020-02-27 DIAGNOSIS — K648 Other hemorrhoids: Secondary | ICD-10-CM | POA: Diagnosis not present

## 2020-02-27 LAB — HM COLONOSCOPY

## 2020-03-06 DIAGNOSIS — Z903 Acquired absence of stomach [part of]: Secondary | ICD-10-CM | POA: Diagnosis not present

## 2020-03-06 DIAGNOSIS — K219 Gastro-esophageal reflux disease without esophagitis: Secondary | ICD-10-CM | POA: Diagnosis not present

## 2020-03-06 DIAGNOSIS — K449 Diaphragmatic hernia without obstruction or gangrene: Secondary | ICD-10-CM | POA: Diagnosis not present

## 2020-03-10 DIAGNOSIS — M25512 Pain in left shoulder: Secondary | ICD-10-CM | POA: Diagnosis not present

## 2020-03-10 DIAGNOSIS — M75122 Complete rotator cuff tear or rupture of left shoulder, not specified as traumatic: Secondary | ICD-10-CM | POA: Diagnosis not present

## 2020-03-14 ENCOUNTER — Ambulatory Visit: Payer: Non-veteran care | Admitting: Gastroenterology

## 2020-03-19 DIAGNOSIS — M75122 Complete rotator cuff tear or rupture of left shoulder, not specified as traumatic: Secondary | ICD-10-CM | POA: Diagnosis not present

## 2020-03-23 DIAGNOSIS — Z79899 Other long term (current) drug therapy: Secondary | ICD-10-CM | POA: Diagnosis not present

## 2020-03-23 DIAGNOSIS — Z9884 Bariatric surgery status: Secondary | ICD-10-CM | POA: Diagnosis not present

## 2020-03-23 DIAGNOSIS — L940 Localized scleroderma [morphea]: Secondary | ICD-10-CM | POA: Diagnosis not present

## 2020-03-23 DIAGNOSIS — L930 Discoid lupus erythematosus: Secondary | ICD-10-CM | POA: Diagnosis not present

## 2020-03-23 DIAGNOSIS — M189 Osteoarthritis of first carpometacarpal joint, unspecified: Secondary | ICD-10-CM | POA: Diagnosis not present

## 2020-03-30 DIAGNOSIS — M25512 Pain in left shoulder: Secondary | ICD-10-CM | POA: Diagnosis not present

## 2020-03-30 DIAGNOSIS — M75122 Complete rotator cuff tear or rupture of left shoulder, not specified as traumatic: Secondary | ICD-10-CM | POA: Diagnosis not present

## 2020-04-03 DIAGNOSIS — K219 Gastro-esophageal reflux disease without esophagitis: Secondary | ICD-10-CM | POA: Diagnosis not present

## 2020-04-03 DIAGNOSIS — Z780 Asymptomatic menopausal state: Secondary | ICD-10-CM | POA: Diagnosis not present

## 2020-04-03 DIAGNOSIS — R1013 Epigastric pain: Secondary | ICD-10-CM | POA: Diagnosis not present

## 2020-04-03 DIAGNOSIS — Z1231 Encounter for screening mammogram for malignant neoplasm of breast: Secondary | ICD-10-CM | POA: Diagnosis not present

## 2020-04-03 DIAGNOSIS — M8589 Other specified disorders of bone density and structure, multiple sites: Secondary | ICD-10-CM | POA: Diagnosis not present

## 2020-04-03 LAB — HM DEXA SCAN

## 2020-04-03 LAB — HM MAMMOGRAPHY

## 2020-04-04 ENCOUNTER — Encounter: Payer: Self-pay | Admitting: Internal Medicine

## 2020-04-05 ENCOUNTER — Encounter: Payer: Self-pay | Admitting: Internal Medicine

## 2020-04-06 DIAGNOSIS — M75122 Complete rotator cuff tear or rupture of left shoulder, not specified as traumatic: Secondary | ICD-10-CM | POA: Diagnosis not present

## 2020-04-06 DIAGNOSIS — M25512 Pain in left shoulder: Secondary | ICD-10-CM | POA: Diagnosis not present

## 2020-04-11 ENCOUNTER — Encounter: Payer: Self-pay | Admitting: Internal Medicine

## 2020-04-16 DIAGNOSIS — M75122 Complete rotator cuff tear or rupture of left shoulder, not specified as traumatic: Secondary | ICD-10-CM | POA: Diagnosis not present

## 2020-04-16 DIAGNOSIS — L930 Discoid lupus erythematosus: Secondary | ICD-10-CM | POA: Diagnosis not present

## 2020-04-16 DIAGNOSIS — L209 Atopic dermatitis, unspecified: Secondary | ICD-10-CM | POA: Diagnosis not present

## 2020-04-16 DIAGNOSIS — I872 Venous insufficiency (chronic) (peripheral): Secondary | ICD-10-CM | POA: Diagnosis not present

## 2020-04-16 DIAGNOSIS — L739 Follicular disorder, unspecified: Secondary | ICD-10-CM | POA: Diagnosis not present

## 2020-04-18 DIAGNOSIS — K449 Diaphragmatic hernia without obstruction or gangrene: Secondary | ICD-10-CM | POA: Diagnosis not present

## 2020-04-18 DIAGNOSIS — K219 Gastro-esophageal reflux disease without esophagitis: Secondary | ICD-10-CM | POA: Diagnosis not present

## 2020-05-09 DIAGNOSIS — Z20822 Contact with and (suspected) exposure to covid-19: Secondary | ICD-10-CM | POA: Diagnosis not present

## 2020-05-09 DIAGNOSIS — K449 Diaphragmatic hernia without obstruction or gangrene: Secondary | ICD-10-CM | POA: Diagnosis not present

## 2020-05-09 DIAGNOSIS — Z01818 Encounter for other preprocedural examination: Secondary | ICD-10-CM | POA: Diagnosis not present

## 2020-05-11 DIAGNOSIS — Z01818 Encounter for other preprocedural examination: Secondary | ICD-10-CM | POA: Diagnosis not present

## 2020-05-11 DIAGNOSIS — R1084 Generalized abdominal pain: Secondary | ICD-10-CM | POA: Diagnosis not present

## 2020-05-11 DIAGNOSIS — Z87891 Personal history of nicotine dependence: Secondary | ICD-10-CM | POA: Diagnosis not present

## 2020-05-11 DIAGNOSIS — G8918 Other acute postprocedural pain: Secondary | ICD-10-CM | POA: Diagnosis not present

## 2020-05-11 DIAGNOSIS — Z9049 Acquired absence of other specified parts of digestive tract: Secondary | ICD-10-CM | POA: Diagnosis not present

## 2020-05-11 DIAGNOSIS — F32A Depression, unspecified: Secondary | ICD-10-CM | POA: Diagnosis not present

## 2020-05-11 DIAGNOSIS — K760 Fatty (change of) liver, not elsewhere classified: Secondary | ICD-10-CM | POA: Diagnosis not present

## 2020-05-11 DIAGNOSIS — K219 Gastro-esophageal reflux disease without esophagitis: Secondary | ICD-10-CM | POA: Diagnosis not present

## 2020-05-11 DIAGNOSIS — Z9884 Bariatric surgery status: Secondary | ICD-10-CM | POA: Diagnosis not present

## 2020-05-11 DIAGNOSIS — G4730 Sleep apnea, unspecified: Secondary | ICD-10-CM | POA: Diagnosis not present

## 2020-05-11 DIAGNOSIS — Z79899 Other long term (current) drug therapy: Secondary | ICD-10-CM | POA: Diagnosis not present

## 2020-05-11 DIAGNOSIS — E039 Hypothyroidism, unspecified: Secondary | ICD-10-CM | POA: Diagnosis not present

## 2020-05-11 DIAGNOSIS — Z79890 Hormone replacement therapy (postmenopausal): Secondary | ICD-10-CM | POA: Diagnosis not present

## 2020-05-11 DIAGNOSIS — K449 Diaphragmatic hernia without obstruction or gangrene: Secondary | ICD-10-CM | POA: Diagnosis not present

## 2020-05-16 DIAGNOSIS — K449 Diaphragmatic hernia without obstruction or gangrene: Secondary | ICD-10-CM | POA: Diagnosis not present

## 2020-05-16 DIAGNOSIS — R1031 Right lower quadrant pain: Secondary | ICD-10-CM | POA: Diagnosis not present

## 2020-05-24 DIAGNOSIS — K219 Gastro-esophageal reflux disease without esophagitis: Secondary | ICD-10-CM | POA: Diagnosis not present

## 2020-05-24 DIAGNOSIS — Z86010 Personal history of colonic polyps: Secondary | ICD-10-CM | POA: Diagnosis not present

## 2020-05-24 DIAGNOSIS — K5900 Constipation, unspecified: Secondary | ICD-10-CM | POA: Diagnosis not present

## 2020-05-24 DIAGNOSIS — Z9884 Bariatric surgery status: Secondary | ICD-10-CM | POA: Diagnosis not present

## 2020-06-20 DIAGNOSIS — M1851 Other unilateral secondary osteoarthritis of first carpometacarpal joint, right hand: Secondary | ICD-10-CM | POA: Diagnosis not present

## 2020-08-27 DIAGNOSIS — L940 Localized scleroderma [morphea]: Secondary | ICD-10-CM | POA: Diagnosis not present

## 2020-08-27 DIAGNOSIS — L930 Discoid lupus erythematosus: Secondary | ICD-10-CM | POA: Diagnosis not present

## 2020-09-05 ENCOUNTER — Ambulatory Visit (INDEPENDENT_AMBULATORY_CARE_PROVIDER_SITE_OTHER): Payer: BC Managed Care – PPO | Admitting: Internal Medicine

## 2020-09-05 ENCOUNTER — Encounter: Payer: Self-pay | Admitting: Internal Medicine

## 2020-09-05 ENCOUNTER — Other Ambulatory Visit: Payer: Self-pay

## 2020-09-05 VITALS — BP 130/84 | HR 70 | Temp 96.7°F | Ht 61.34 in | Wt 196.0 lb

## 2020-09-05 DIAGNOSIS — G4700 Insomnia, unspecified: Secondary | ICD-10-CM | POA: Diagnosis not present

## 2020-09-05 DIAGNOSIS — Z23 Encounter for immunization: Secondary | ICD-10-CM

## 2020-09-05 DIAGNOSIS — F419 Anxiety disorder, unspecified: Secondary | ICD-10-CM | POA: Diagnosis not present

## 2020-09-05 DIAGNOSIS — F32A Depression, unspecified: Secondary | ICD-10-CM

## 2020-09-05 DIAGNOSIS — Z0000 Encounter for general adult medical examination without abnormal findings: Secondary | ICD-10-CM | POA: Diagnosis not present

## 2020-09-05 DIAGNOSIS — G43909 Migraine, unspecified, not intractable, without status migrainosus: Secondary | ICD-10-CM

## 2020-09-05 MED ORDER — ESZOPICLONE 3 MG PO TABS: 3.0000 mg | ORAL_TABLET | Freq: Every evening | ORAL | 1 refills | Status: DC | PRN

## 2020-09-05 MED ORDER — RIZATRIPTAN BENZOATE 10 MG PO TBDP: 10.0000 mg | ORAL_TABLET | ORAL | 11 refills | Status: DC | PRN

## 2020-09-05 MED ORDER — DULOXETINE HCL 20 MG PO CPEP: 20.0000 mg | ORAL_CAPSULE | Freq: Every day | ORAL | 3 refills | Status: DC

## 2020-09-05 NOTE — Patient Instructions (Addendum)
[facility] to me, colonoscopy/EGD Check dose of thyroid medication and let me know  Check with dental about mouth piece for sleep apnea Dr Augustina Mood [place] [facility]    Call for therapy and psychiatry  [facility] [place] [address] [phone]   [facility] [place] [address] [place] [address] [phone]    Zoster Vaccine, Recombinant injection What is this medication? ZOSTER VACCINE (ZOS ter vak SEEN) is a vaccine used to reduce the risk of getting shingles. This vaccine is not used to treat shingles or nerve pain fromshingles. This medicine may be used for other purposes; ask your health care provider orpharmacist if you have questions. COMMON BRAND NAME(S): [facility] What should I tell my care team before I take this medication? They need to know if you have any of these conditions: cancer immune system problems an unusual or allergic reaction to Zoster vaccine, other medications, foods, dyes, or preservatives pregnant or trying to get pregnant breast-feeding How should I use this medication? This vaccine is injected into a muscle. It is given by a health care provider. A copy of Vaccine Information Statements will be given before each vaccination. Be sure to read this information carefully each time. This sheet may changeoften. Talk to your health care provider about the use of this vaccine in children.This vaccine is not approved for use in children. Overdosage: If you think you have taken too much of this medicine contact apoison control center or emergency room at once. NOTE: This medicine is only for you. Do not share this medicine with others. What if I miss a dose? Keep appointments for follow-up (booster) doses. It is important not to miss your dose. Call your health care provider if you are unable to keep anappointment. What may  interact with this medication? medicines that suppress your immune system medicines to treat cancer steroid medicines like prednisone or cortisone This list may not describe all possible interactions. Give your health care provider a list of all the medicines, herbs, non-prescription drugs, or dietary supplements you use. Also tell them if you smoke, drink alcohol, or use illegaldrugs. Some items may interact with your medicine. What should I watch for while using this medication? Visit your health care provider regularly. This vaccine, like all vaccines, may not fully protect everyone. What side effects may I notice from receiving this medication? Side effects that you should report to your doctor or health care professionalas soon as possible: allergic reactions (skin rash, itching or hives; swelling of the face, lips, or tongue) trouble breathing Side effects that usually do not require medical attention (report these toyour doctor or health care professional if they continue or are bothersome): chills headache fever nausea pain, redness, or irritation at site where injected tiredness vomiting This list may not describe all possible side effects. Call your doctor for medical advice about side effects. You may report side effects to FDA at1-800-FDA-1088. Where should I keep my medication? This vaccine is only given by a health care provider. It will not be stored athome. NOTE: This sheet is a summary. It may not cover all possible information. If you have questions about this medicine, talk to your doctor, pharmacist, orhealth care provider.  2022 Elsevier/Gold Standard (2019-02-25 16:23:07)

## 2020-09-05 NOTE — Progress Notes (Signed)
Chief Complaint  Patient presents with   Annual Exam   Annual  1. Utd covid vaccines 4/4  2. Pap s/p hysterectomy will sch f/u with ob/gyn at [facility] 3. S/p HH repair 05/2020 with Dr. Kreg Shropshire food was getting stick and GERD but sx's improved on protonix still not completely better and had complications of hematoma after surgery disc with him gastric bypass s/p gastric sleeve  4. Insomnia on lunesta 2 mg qhs not helping wants to increase dose anxiety/depression phq 9 score 12 and gad 7 score 9 today family dynamic stressors and she supports financially entire household  5. Will get shingrix today  6. Migraines controlled on prn maxalt dig tablet   Review of Systems  Constitutional:  Negative for weight loss.  HENT:  Negative for hearing loss.   Eyes:  Negative for blurred vision.  Respiratory:  Negative for shortness of breath.   Cardiovascular:  Negative for chest pain.  Gastrointestinal:  Negative for abdominal pain.  Musculoskeletal:  Negative for falls and joint pain.  Skin:  Negative for rash.  Neurological:  Negative for headaches.  Psychiatric/Behavioral:  Positive for depression. The patient is nervous/anxious.   Past Medical History:  Diagnosis Date   Anxiety    Arthritis    Chronic gastritis    egd 04/18/16 + chronic gastritis and EGD 06/16/16 neg H pylori    Depression    Discoid lupus    Eczema    Fibroids    Fibromyalgia    Gallstones    11/25/17 [place]   Hashimoto's disease    Hyperlipemia    Hypothyroidism    Hypothyroidism    Kidney stone    left [facility] 2017 CT scan    Lateral epicondylitis    Lupus ([place])    Morphea    Morphea    Plantar fasciitis    PLE (polymorphic light eruption)    Prediabetes    Sleep apnea    Urine incontinence    Past Surgical History:  Procedure Laterality Date   ABDOMINAL HYSTERECTOMY     uterus and both ovaries out per pt cervix intact h/o PID in 1986, scar tissue, hemorrhagic cysts; multiple pelvic surgeries 1986 to 2006     APPENDECTOMY     cervical radiofrequency     in [place] LITHOTRIPSY Left 12/20/2015   Procedure: EXTRACORPOREAL SHOCK WAVE LITHOTRIPSY (ESWL);  Surgeon: Nickie Retort, MD;  Location: [facility] ORS;  Service: Urology;  Laterality: Left;   GALLBLADDER SURGERY     02/2018 Dr. Darnell Level   HIATAL HERNIA REPAIR     05/2019 Dr. Kreg Shropshire with complications hematoma   LAPAROSCOPIC GASTRIC SLEEVE RESECTION     06/2016 Dr. Darnell Level    LAPAROSCOPIC TOTAL HYSTERECTOMY  2004   OTHER SURGICAL HISTORY     ROTATOR CUFF REPAIR Right    02/2004   Family History  Problem Relation Age of Onset   Kidney cancer Maternal Grandmother    Hypertension Maternal Grandmother    Arthritis Mother    Hyperlipidemia Mother    Hypertension Mother    Heart disease Father    Early death Father    Prostate cancer Neg Hx    Social History   Socioeconomic History   Marital status: Married    Spouse name: Not on file   Number of children: Not on file   Years of education: Not on file   Highest education level: Not on file  Occupational History  Not on file  Tobacco Use   Smoking status: Former   Smokeless tobacco: Never  Substance and Sexual Activity   Alcohol use: Yes   Drug use: No   Sexual activity: Not Currently    Comment: female   Other Topics Concern   Not on file  Social History Narrative   [employer] [facility]    60 y.o twins    Married to wife    No guns, wears seat belt, safe in relationship    [place]   Social Determinants of Health   Financial Resource Strain: Not on file  Food Insecurity: Not on file  Transportation Needs: Not on file  Physical Activity: Not on file  Stress: Not on file  Social Connections: Not on file  Intimate Partner Violence: Not on file   Current Meds  Medication Sig   atorvastatin (LIPITOR) 20 MG tablet Take 20 mg by mouth daily.   Carboxymethylcellulose Sodium 0.25 % SOLN Administer 2 drops to both eyes  daily as needed.   clobetasol ointment (TEMOVATE) 0.05 % clobetasol 0.05 % topical ointment   cyclobenzaprine (FLEXERIL) 5 MG tablet Take 5 mg by mouth 3 (three) times daily as needed for muscle spasms.   cycloSPORINE (RESTASIS) 0.05 % ophthalmic emulsion Administer 2 drops to both eyes every morning.   fluticasone (FLONASE) 50 MCG/ACT nasal spray fluticasone propionate 50 mcg/actuation nasal spray,suspension   folic acid (FOLVITE) 1 MG tablet Take 1 mg by mouth daily.   gabapentin (NEURONTIN) 100 MG capsule Take 100 mg by mouth.   HYDROcodone-acetaminophen (NORCO) 10-325 MG tablet Take 1 tablet by mouth 3 (three) times daily as needed.   hydrocortisone 2.5 % cream Apply topically 2 (two) times daily. Prn neck   ipratropium (ATROVENT) 0.06 % nasal spray Place 2 sprays into both nostrils 4 (four) times daily.   levocetirizine (XYZAL) 5 MG tablet Take 1 tablet (5 mg total) by mouth at bedtime as needed for allergies.   levothyroxine (SYNTHROID, LEVOTHROID) 100 MCG tablet Take 175 mcg by mouth daily before breakfast.    Methotrexate 2.5 MG/ML SOLN 15 mg once a week.   morphine (MSIR) 15 MG tablet Take 15 mg by mouth every 4 (four) hours as needed.    pantoprazole (PROTONIX) 40 MG tablet Take 1 tablet (40 mg total) by mouth daily. At lunch 30 minutes before food   sodium chloride (OCEAN) 0.65 % SOLN nasal spray Place 2 sprays into both nostrils as needed for congestion.   tamsulosin (FLOMAX) 0.4 MG CAPS capsule Take 1 capsule (0.4 mg total) by mouth daily.   topiramate (TOPAMAX) 25 MG capsule Take 25 mg by mouth 2 (two) times daily.   [DISCONTINUED] doxycycline (VIBRA-TABS) 100 MG tablet Take 1 tablet (100 mg total) by mouth 2 (two) times daily. With food   [DISCONTINUED] DULoxetine (CYMBALTA) 20 MG capsule Take 1 capsule (20 mg total) by mouth daily.   [DISCONTINUED] eszopiclone (LUNESTA) 2 MG TABS tablet Take 0.5-1 tablets (1-2 mg total) by mouth at bedtime as needed for sleep. Take immediately  before bedtime   [DISCONTINUED] mupirocin ointment (BACTROBAN) 2 % Apply 1 application topically 3 (three) times daily. Left knee   [DISCONTINUED] rizatriptan (MAXALT-MLT) 10 MG disintegrating tablet Take 1 tablet (10 mg total) by mouth as needed for migraine. May repeat in 2 hours if needed. No more than 2x per week. No more than 30 mg in 24 hrs   Allergies  Allergen Reactions   Plaquenil  [Hydroxychloroquine Sulfate]  Rash   Lactose Intolerance (Gi) Diarrhea   Other Rash    Sunlight  Polymorphous light eruption    Plaquenil [Hydroxychloroquine] Rash   No results found for this or any previous visit (from the past 2160 hour(s)). Objective  Body mass index is 36.63 kg/m. Wt Readings from Last 3 Encounters:  09/05/20 196 lb (88.9 kg)  09/23/19 197 lb 6.4 oz (89.5 kg)  05/20/19 188 lb 9.6 oz (85.5 kg)   Temp Readings from Last 3 Encounters:  09/05/20 (!) 96.7 F (35.9 C) (Temporal)  09/23/19 98.2 F (36.8 C) (Oral)  05/20/19 97.9 F (36.6 C) (Oral)   BP Readings from Last 3 Encounters:  09/05/20 130/84  09/23/19 104/72  05/20/19 122/80   Pulse Readings from Last 3 Encounters:  09/05/20 70  09/23/19 82  05/20/19 81    Physical Exam Vitals and nursing note reviewed.  Constitutional:      Appearance: Normal appearance. She is well-developed and well-groomed. She is obese.  HENT:     Head: Normocephalic and atraumatic.  Eyes:     Conjunctiva/sclera: Conjunctivae normal.     Pupils: Pupils are equal, round, and reactive to light.  Cardiovascular:     Rate and Rhythm: Normal rate and regular rhythm.     Heart sounds: Normal heart sounds. No murmur heard. Abdominal:     Tenderness: There is no abdominal tenderness.  Skin:    General: Skin is warm and dry.  Neurological:     General: No focal deficit present.     Mental Status: She is alert and oriented to person, place, and time. Mental status is at baseline.     Gait: Gait normal.  Psychiatric:        Attention  and Perception: Attention and perception normal.        Mood and Affect: Mood and affect normal.        Speech: Speech normal.        Behavior: Behavior normal. Behavior is cooperative.        Thought Content: Thought content normal.        Cognition and Memory: Cognition and memory normal.        Judgment: Judgment normal.    Assessment  Plan  Annual Had labs at [facility] pt will send copy via my chart as well as colonoscopy as of 09/05/20  Flu shot due covid 4/4  Shingrix 1/2 today Tdap 04/18/13 and hep B vx held due to cellcept meds   Colonoscopy and pap get [place] and pap [place] signed today  -colonoscopy 07/05/14 hyperplastic rectal polyp -had 04/2020 ? [facility]  MMR immune   [facility]amm[facility] 04/03/20 negative  S/p hysterectomy fibroids cervix possibly intact will need to do pelvic and possible pap in future if h/o abnormal pap    Former smoker quantify how much and long at f/u  DEXA 04/03/20 osteopenia vit d and calcium    Hep B titer 8.7 and hec neg 12/25/17  Rec healthy diet and exercise   F/u dermatology Dr. Ferrel Logan [facility] Tomasa Blase today consider in future for local MD Eye exam 02/2017  [facility]  Gen surg Dr. Kreg Shropshire  ColonoscopyThere is a scanned record from 12/02/2017 under the media tab that has her GI records from the [place] from 07/05/14 colonoscopy with a hyperplastic rectal polyp removed  Anxiety and depression - Plan: DULoxetine (CYMBALTA) 20 MG capsule qd  Rec  call [facility] for therapy and psych  Migraine without status migrainosus, not intractable, unspecified migraine type - Plan: rizatriptan (MAXALT-MLT) 10 MG disintegrating tablet  Insomnia, unspecified type - Plan: eszopiclone 3 MG TABS  Need for shingles vaccine - Plan: Varicella-zoster vaccine IM (Shingrix)   Provider: Dr. Olivia Mackie McLean-Scocuzza-[facility]

## 2020-09-17 DIAGNOSIS — Z8719 Personal history of other diseases of the digestive system: Secondary | ICD-10-CM | POA: Diagnosis not present

## 2020-09-17 DIAGNOSIS — Z9889 Other specified postprocedural states: Secondary | ICD-10-CM | POA: Diagnosis not present

## 2020-11-02 DIAGNOSIS — L930 Discoid lupus erythematosus: Secondary | ICD-10-CM | POA: Diagnosis not present

## 2020-11-08 DIAGNOSIS — K219 Gastro-esophageal reflux disease without esophagitis: Secondary | ICD-10-CM | POA: Diagnosis not present

## 2020-11-08 DIAGNOSIS — Z86010 Personal history of colonic polyps: Secondary | ICD-10-CM | POA: Diagnosis not present

## 2020-11-26 DIAGNOSIS — L930 Discoid lupus erythematosus: Secondary | ICD-10-CM | POA: Diagnosis not present

## 2020-12-03 DIAGNOSIS — J329 Chronic sinusitis, unspecified: Secondary | ICD-10-CM | POA: Diagnosis not present

## 2020-12-03 DIAGNOSIS — B9689 Other specified bacterial agents as the cause of diseases classified elsewhere: Secondary | ICD-10-CM | POA: Diagnosis not present

## 2020-12-03 DIAGNOSIS — J029 Acute pharyngitis, unspecified: Secondary | ICD-10-CM | POA: Diagnosis not present

## 2020-12-03 DIAGNOSIS — Z03818 Encounter for observation for suspected exposure to other biological agents ruled out: Secondary | ICD-10-CM | POA: Diagnosis not present

## 2020-12-06 ENCOUNTER — Ambulatory Visit: Payer: Non-veteran care

## 2020-12-11 ENCOUNTER — Other Ambulatory Visit: Payer: Self-pay

## 2020-12-11 ENCOUNTER — Ambulatory Visit (INDEPENDENT_AMBULATORY_CARE_PROVIDER_SITE_OTHER): Payer: BC Managed Care – PPO

## 2020-12-11 DIAGNOSIS — Z23 Encounter for immunization: Secondary | ICD-10-CM

## 2020-12-11 NOTE — Progress Notes (Signed)
Pt presented for a shingrix vaccine to left deltoid. Pt voiced no concerns or discomfort during time of injection. See 1st injection of shingles during Annual Physical on 09/05/2020

## 2020-12-18 DIAGNOSIS — H524 Presbyopia: Secondary | ICD-10-CM | POA: Diagnosis not present

## 2021-01-09 DIAGNOSIS — M9903 Segmental and somatic dysfunction of lumbar region: Secondary | ICD-10-CM | POA: Diagnosis not present

## 2021-01-09 DIAGNOSIS — M9904 Segmental and somatic dysfunction of sacral region: Secondary | ICD-10-CM | POA: Diagnosis not present

## 2021-01-09 DIAGNOSIS — M5441 Lumbago with sciatica, right side: Secondary | ICD-10-CM | POA: Diagnosis not present

## 2021-01-09 DIAGNOSIS — M461 Sacroiliitis, not elsewhere classified: Secondary | ICD-10-CM | POA: Diagnosis not present

## 2021-01-14 DIAGNOSIS — M9904 Segmental and somatic dysfunction of sacral region: Secondary | ICD-10-CM | POA: Diagnosis not present

## 2021-01-14 DIAGNOSIS — M9903 Segmental and somatic dysfunction of lumbar region: Secondary | ICD-10-CM | POA: Diagnosis not present

## 2021-01-14 DIAGNOSIS — M461 Sacroiliitis, not elsewhere classified: Secondary | ICD-10-CM | POA: Diagnosis not present

## 2021-01-14 DIAGNOSIS — M5441 Lumbago with sciatica, right side: Secondary | ICD-10-CM | POA: Diagnosis not present

## 2021-01-16 DIAGNOSIS — M9903 Segmental and somatic dysfunction of lumbar region: Secondary | ICD-10-CM | POA: Diagnosis not present

## 2021-01-16 DIAGNOSIS — M461 Sacroiliitis, not elsewhere classified: Secondary | ICD-10-CM | POA: Diagnosis not present

## 2021-01-16 DIAGNOSIS — M5441 Lumbago with sciatica, right side: Secondary | ICD-10-CM | POA: Diagnosis not present

## 2021-01-16 DIAGNOSIS — M9904 Segmental and somatic dysfunction of sacral region: Secondary | ICD-10-CM | POA: Diagnosis not present

## 2021-02-01 DIAGNOSIS — Z03818 Encounter for observation for suspected exposure to other biological agents ruled out: Secondary | ICD-10-CM | POA: Diagnosis not present

## 2021-02-01 DIAGNOSIS — J101 Influenza due to other identified influenza virus with other respiratory manifestations: Secondary | ICD-10-CM | POA: Diagnosis not present

## 2021-02-07 DIAGNOSIS — Z6837 Body mass index (BMI) 37.0-37.9, adult: Secondary | ICD-10-CM | POA: Diagnosis not present

## 2021-02-07 DIAGNOSIS — G4733 Obstructive sleep apnea (adult) (pediatric): Secondary | ICD-10-CM | POA: Diagnosis not present

## 2021-02-07 DIAGNOSIS — E669 Obesity, unspecified: Secondary | ICD-10-CM | POA: Diagnosis not present

## 2021-02-20 DIAGNOSIS — G4733 Obstructive sleep apnea (adult) (pediatric): Secondary | ICD-10-CM | POA: Diagnosis not present

## 2021-02-24 ENCOUNTER — Other Ambulatory Visit: Payer: Self-pay | Admitting: Internal Medicine

## 2021-02-25 ENCOUNTER — Other Ambulatory Visit (HOSPITAL_COMMUNITY): Payer: Self-pay

## 2021-02-25 ENCOUNTER — Other Ambulatory Visit: Payer: Self-pay | Admitting: Internal Medicine

## 2021-02-25 MED ORDER — ATORVASTATIN CALCIUM 20 MG PO TABS
20.0000 mg | ORAL_TABLET | Freq: Every day | ORAL | 3 refills | Status: DC
  Filled 2021-02-25: qty 90, 90d supply, fill #0
  Filled 2021-10-11: qty 90, 90d supply, fill #1

## 2021-02-25 NOTE — Telephone Encounter (Signed)
This medication is historical. Last OV 8/22. Last Lipid panel 8/21.

## 2021-03-13 ENCOUNTER — Ambulatory Visit: Payer: Non-veteran care | Admitting: Internal Medicine

## 2021-04-02 ENCOUNTER — Ambulatory Visit: Payer: BC Managed Care – PPO | Admitting: Internal Medicine

## 2021-04-04 DIAGNOSIS — Z1231 Encounter for screening mammogram for malignant neoplasm of breast: Secondary | ICD-10-CM | POA: Diagnosis not present

## 2021-04-04 LAB — HM MAMMOGRAPHY

## 2021-04-08 ENCOUNTER — Other Ambulatory Visit: Payer: Self-pay

## 2021-04-09 ENCOUNTER — Other Ambulatory Visit: Payer: Self-pay

## 2021-04-09 ENCOUNTER — Ambulatory Visit (INDEPENDENT_AMBULATORY_CARE_PROVIDER_SITE_OTHER): Payer: BC Managed Care – PPO | Admitting: Internal Medicine

## 2021-04-09 ENCOUNTER — Encounter: Payer: Self-pay | Admitting: Internal Medicine

## 2021-04-09 VITALS — BP 128/84 | HR 68 | Temp 97.8°F | Ht 61.34 in | Wt 203.8 lb

## 2021-04-09 DIAGNOSIS — R946 Abnormal results of thyroid function studies: Secondary | ICD-10-CM

## 2021-04-09 DIAGNOSIS — M25511 Pain in right shoulder: Secondary | ICD-10-CM

## 2021-04-09 DIAGNOSIS — M25512 Pain in left shoulder: Secondary | ICD-10-CM

## 2021-04-09 DIAGNOSIS — M545 Low back pain, unspecified: Secondary | ICD-10-CM

## 2021-04-09 DIAGNOSIS — Z9889 Other specified postprocedural states: Secondary | ICD-10-CM

## 2021-04-09 DIAGNOSIS — E039 Hypothyroidism, unspecified: Secondary | ICD-10-CM

## 2021-04-09 DIAGNOSIS — M542 Cervicalgia: Secondary | ICD-10-CM

## 2021-04-09 DIAGNOSIS — N62 Hypertrophy of breast: Secondary | ICD-10-CM

## 2021-04-09 DIAGNOSIS — J309 Allergic rhinitis, unspecified: Secondary | ICD-10-CM

## 2021-04-09 DIAGNOSIS — R1084 Generalized abdominal pain: Secondary | ICD-10-CM

## 2021-04-09 DIAGNOSIS — E611 Iron deficiency: Secondary | ICD-10-CM

## 2021-04-09 DIAGNOSIS — K449 Diaphragmatic hernia without obstruction or gangrene: Secondary | ICD-10-CM

## 2021-04-09 DIAGNOSIS — R7303 Prediabetes: Secondary | ICD-10-CM | POA: Diagnosis not present

## 2021-04-09 DIAGNOSIS — Z13220 Encounter for screening for lipoid disorders: Secondary | ICD-10-CM | POA: Diagnosis not present

## 2021-04-09 DIAGNOSIS — R748 Abnormal levels of other serum enzymes: Secondary | ICD-10-CM

## 2021-04-09 DIAGNOSIS — K760 Fatty (change of) liver, not elsewhere classified: Secondary | ICD-10-CM

## 2021-04-09 LAB — CBC WITH DIFFERENTIAL/PLATELET
Basophils Absolute: 0 10*3/uL (ref 0.0–0.1)
Basophils Relative: 0.4 % (ref 0.0–3.0)
Eosinophils Absolute: 0 10*3/uL (ref 0.0–0.7)
Eosinophils Relative: 0.9 % (ref 0.0–5.0)
HCT: 39.1 % (ref 36.0–46.0)
Hemoglobin: 13 g/dL (ref 12.0–15.0)
Lymphocytes Relative: 23.5 % (ref 12.0–46.0)
Lymphs Abs: 1.2 10*3/uL (ref 0.7–4.0)
MCHC: 33.2 g/dL (ref 30.0–36.0)
MCV: 94.7 fl (ref 78.0–100.0)
Monocytes Absolute: 0.4 10*3/uL (ref 0.1–1.0)
Monocytes Relative: 8 % (ref 3.0–12.0)
Neutro Abs: 3.6 10*3/uL (ref 1.4–7.7)
Neutrophils Relative %: 67.2 % (ref 43.0–77.0)
Platelets: 202 10*3/uL (ref 150.0–400.0)
RBC: 4.13 Mil/uL (ref 3.87–5.11)
RDW: 15.7 % — ABNORMAL HIGH (ref 11.5–15.5)
WBC: 5.3 10*3/uL (ref 4.0–10.5)

## 2021-04-09 LAB — COMPREHENSIVE METABOLIC PANEL
ALT: 132 U/L — ABNORMAL HIGH (ref 0–35)
AST: 56 U/L — ABNORMAL HIGH (ref 0–37)
Albumin: 4 g/dL (ref 3.5–5.2)
Alkaline Phosphatase: 96 U/L (ref 39–117)
BUN: 15 mg/dL (ref 6–23)
CO2: 29 mEq/L (ref 19–32)
Calcium: 8.9 mg/dL (ref 8.4–10.5)
Chloride: 107 mEq/L (ref 96–112)
Creatinine, Ser: 0.76 mg/dL (ref 0.40–1.20)
GFR: 85.4 mL/min (ref >60.00)
Glucose, Bld: 112 mg/dL — ABNORMAL HIGH (ref 70–99)
Potassium: 4.2 mEq/L (ref 3.5–5.1)
Sodium: 140 mEq/L (ref 135–145)
Total Bilirubin: 0.7 mg/dL (ref 0.2–1.2)
Total Protein: 7 g/dL (ref 6.0–8.3)

## 2021-04-09 LAB — LIPID PANEL
Cholesterol: 155 mg/dL (ref 0–200)
HDL: 61.7 mg/dL (ref >39.00)
LDL Cholesterol: 74 mg/dL (ref 0–99)
NonHDL: 93.38
Total CHOL/HDL Ratio: 3
Triglycerides: 97 mg/dL (ref 0.0–149.0)
VLDL: 19.4 mg/dL (ref 0.0–40.0)

## 2021-04-09 LAB — IBC + FERRITIN
Ferritin: 135.8 ng/mL (ref 10.0–291.0)
Iron: 117 ug/dL (ref 42–145)
Saturation Ratios: 41.6 % (ref 20.0–50.0)
TIBC: 281.4 ug/dL (ref 250.0–450.0)
Transferrin: 201 mg/dL — ABNORMAL LOW (ref 212.0–360.0)

## 2021-04-09 LAB — T4, FREE: Free T4: 1.24 ng/dL (ref 0.60–1.60)

## 2021-04-09 LAB — TSH: TSH: 0.28 u[IU]/mL — ABNORMAL LOW (ref 0.35–5.50)

## 2021-04-09 LAB — HEMOGLOBIN A1C: Hgb A1c MFr Bld: 6.1 % (ref 4.6–6.5)

## 2021-04-09 MED ORDER — LEVOCETIRIZINE DIHYDROCHLORIDE 5 MG PO TABS: 5.0000 mg | ORAL_TABLET | Freq: Every evening | ORAL | 3 refills | Status: DC | PRN

## 2021-04-09 NOTE — Patient Instructions (Addendum)
[facility] as given the info before for both  ?[facility] [place]  ?[place] ?([phone]  ?  ?[facility] [place]  ?[address]  ?[place] [address]  ?[phone]  ? ? ?Need copy of mammogram and colonoscopy from [place] please [phone] my fax #  ? ? ? ?MD Physician  ? ?Primary Contact Information ? ?Phone Fax E-mail Address  ?[phone] Not available [address]  ? [place] [address]  ?   ? ?

## 2021-04-09 NOTE — Progress Notes (Signed)
Chief Complaint  Patient presents with   Follow-up   F/u  1. Prediabetes/hld rec healthy adiet and exercise  2. Allergies needs refill of xyzal  3. Neck, low back and b/l shoulder pain and indentation due to large breasts wants referral to plastics    Review of Systems  Constitutional:  Negative for weight loss.  HENT:  Negative for hearing loss.   Eyes:  Negative for blurred vision.  Respiratory:  Negative for shortness of breath.   Cardiovascular:  Negative for chest pain.  Gastrointestinal:  Negative for abdominal pain and blood in stool.  Genitourinary:  Negative for dysuria.  Musculoskeletal:  Negative for falls and joint pain.  Skin:  Negative for rash.  Neurological:  Negative for headaches.  Psychiatric/Behavioral:  Negative for depression.   Past Medical History:  Diagnosis Date   Anxiety    Arthritis    Chronic gastritis    egd 04/18/16 + chronic gastritis and EGD 06/16/16 neg H pylori    Depression    Discoid lupus    Eczema    Fibroids    Fibromyalgia    Gallstones    11/25/17 [place]   Hashimoto's disease    Hyperlipemia    Hypothyroidism    Hypothyroidism    Kidney stone    left [facility] 2017 CT scan    Lateral epicondylitis    Lupus ([place])    Morphea    Morphea    Plantar fasciitis    PLE (polymorphic light eruption)    Prediabetes    Sleep apnea    Urine incontinence    Past Surgical History:  Procedure Laterality Date   ABDOMINAL HYSTERECTOMY     uterus and both ovaries out per pt cervix intact h/o PID in 1986, scar tissue, hemorrhagic cysts; multiple pelvic surgeries 1986 to 2006    APPENDECTOMY     cervical radiofrequency     in [place] LITHOTRIPSY Left 12/20/2015   Procedure: EXTRACORPOREAL SHOCK WAVE LITHOTRIPSY (ESWL);  Surgeon: Nickie Retort, MD;  Location: [facility] ORS;  Service: Urology;  Laterality: Left;   GALLBLADDER SURGERY     02/2018 Dr. Darnell Level   HIATAL HERNIA REPAIR     05/2019 Dr. Kreg Shropshire with complications  hematoma   LAPAROSCOPIC GASTRIC SLEEVE RESECTION     06/2016 Dr. Darnell Level    LAPAROSCOPIC TOTAL HYSTERECTOMY  2004   OTHER SURGICAL HISTORY     ROTATOR CUFF REPAIR Right    02/2004   Family History  Problem Relation Age of Onset   Kidney cancer Maternal Grandmother    Hypertension Maternal Grandmother    Arthritis Mother    Hyperlipidemia Mother    Hypertension Mother    Heart disease Father    Early death Father    Prostate cancer Neg Hx    Social History   Socioeconomic History   Marital status: Married    Spouse name: Not on file   Number of children: Not on file   Years of education: Not on file   Highest education level: Not on file  Occupational History   Not on file  Tobacco Use   Smoking status: Former   Smokeless tobacco: Never  Substance and Sexual Activity   Alcohol use: Yes   Drug use: No   Sexual activity: Not Currently    Comment: female   Other Topics Concern   Not on file  Social History Narrative   [employer]  61 y.o twins    Married to wife    No guns, wears seat belt, safe in relationship    [place]   Social Determinants of Health   Financial Resource Strain: Not on file  Food Insecurity: Not on file  Transportation Needs: Not on file  Physical Activity: Not on file  Stress: Not on file  Social Connections: Not on file  Intimate Partner Violence: Not on file   Current Meds  Medication Sig   atorvastatin (LIPITOR) 20 MG tablet Take 1 tablet (20 mg total) by mouth daily.   Carboxymethylcellulose Sodium 0.25 % SOLN Administer 2 drops to both eyes daily as needed.   clobetasol ointment (TEMOVATE) 0.05 % clobetasol 0.05 % topical ointment   cyclobenzaprine (FLEXERIL) 5 MG tablet Take 5 mg by mouth 3 (three) times daily as needed for muscle spasms.   cycloSPORINE (RESTASIS) 0.05 % ophthalmic emulsion Administer 2 drops to both eyes every morning.   DULoxetine (CYMBALTA) 20 MG capsule Take 1 capsule (20  mg total) by mouth daily.   eszopiclone 3 MG TABS Take 1 tablet (3 mg total) by mouth at bedtime as needed for sleep. Take immediately before bedtime   fluticasone (FLONASE) 50 MCG/ACT nasal spray fluticasone propionate 50 mcg/actuation nasal spray,suspension   folic acid (FOLVITE) 1 MG tablet Take 1 mg by mouth daily.   gabapentin (NEURONTIN) 100 MG capsule Take 100 mg by mouth.   HYDROcodone-acetaminophen (NORCO) 10-325 MG tablet Take 1 tablet by mouth 3 (three) times daily as needed.   hydrocortisone 2.5 % cream Apply topically 2 (two) times daily. Prn neck   ipratropium (ATROVENT) 0.06 % nasal spray Place 2 sprays into both nostrils 4 (four) times daily.   levothyroxine (SYNTHROID, LEVOTHROID) 100 MCG tablet Take 175 mcg by mouth daily before breakfast.    Methotrexate 2.5 MG/ML SOLN 15 mg once a week.   morphine (MSIR) 15 MG tablet Take 15 mg by mouth every 4 (four) hours as needed.    pantoprazole (PROTONIX) 40 MG tablet Take 1 tablet (40 mg total) by mouth daily. At lunch 30 minutes before food   rizatriptan (MAXALT-MLT) 10 MG disintegrating tablet Take 1 tablet (10 mg total) by mouth as needed for migraine. May repeat in 2 hours if needed. No more than 2x per week. No more than 30 mg in 24 hrs   sodium chloride (OCEAN) 0.65 % SOLN nasal spray Place 2 sprays into both nostrils as needed for congestion.   tamsulosin (FLOMAX) 0.4 MG CAPS capsule Take 1 capsule (0.4 mg total) by mouth daily.   topiramate (TOPAMAX) 25 MG capsule Take 25 mg by mouth 2 (two) times daily.   Allergies  Allergen Reactions   Plaquenil  [Hydroxychloroquine Sulfate] Rash   Lactose Intolerance (Gi) Diarrhea   Other Rash    Sunlight  Polymorphous light eruption    Plaquenil [Hydroxychloroquine] Rash   No results found for this or any previous visit (from the past 2160 hour(s)). Objective  Body mass index is 38.08 kg/m. Wt Readings from Last 3 Encounters:  04/09/21 203 lb 12.8 oz (92.4 kg)  09/05/20 196 lb  (88.9 kg)  09/23/19 197 lb 6.4 oz (89.5 kg)   Temp Readings from Last 3 Encounters:  04/09/21 97.8 F (36.6 C) (Oral)  09/05/20 (!) 96.7 F (35.9 C) (Temporal)  09/23/19 98.2 F (36.8 C) (Oral)   BP Readings from Last 3 Encounters:  04/09/21 128/84  09/05/20 130/84  09/23/19 104/72   Pulse Readings from Last  3 Encounters:  04/09/21 68  09/05/20 70  09/23/19 82    Physical Exam Vitals and nursing note reviewed.  Constitutional:      Appearance: Normal appearance. She is well-developed and well-groomed.  HENT:     Head: Normocephalic and atraumatic.  Eyes:     Conjunctiva/sclera: Conjunctivae normal.     Pupils: Pupils are equal, round, and reactive to light.  Cardiovascular:     Rate and Rhythm: Normal rate and regular rhythm.     Heart sounds: Normal heart sounds. No murmur heard. Pulmonary:     Effort: Pulmonary effort is normal.     Breath sounds: Normal breath sounds.  Abdominal:     General: Abdomen is flat. Bowel sounds are normal.     Tenderness: There is no abdominal tenderness.  Musculoskeletal:        General: No tenderness.  Skin:    General: Skin is warm and dry.  Neurological:     General: No focal deficit present.     Mental Status: She is alert and oriented to person, place, and time. Mental status is at baseline.     Cranial Nerves: Cranial nerves 2-12 are intact.     Coordination: Coordination is intact.     Gait: Gait is intact.  Psychiatric:        Attention and Perception: Attention and perception normal.        Mood and Affect: Mood and affect normal.        Speech: Speech normal.        Behavior: Behavior normal. Behavior is cooperative.        Thought Content: Thought content normal.        Cognition and Memory: Cognition and memory normal.        Judgment: Judgment normal.    Assessment  Plan  Large breasts - Plan: Ambulatory referral to Plastic Surgery  Iron deficiency s/p gastric surgery- Plan: CBC with Differential/Platelet,  IBC + Ferritin  Abnormal thyroid function test  - Plan: TSH, T4, free  Prediabetes - Plan: Comprehensive metabolic panel, Lipid panel, Hemoglobin A1c  S/P gastric surgery - Plan: Comprehensive metabolic panel  Large breasts with Bilateral shoulder pain, unspecified chronicity - Plan: Ambulatory referral to Plastic Surgery  Cervicalgia - Plan: Ambulatory referral to Plastic Surgery  Midline low back pain, unspecified chronicity, unspecified whether sciatica present - Plan: Ambulatory referral to Plastic Surgery  Allergic rhinitis, unspecified seasonality, unspecified trigger - Plan: levocetirizine (XYZAL) 5 MG tablet     HM Flu shot due covid 4/4  Shingrix 2/2 Tdap 04/18/13 and hep B vx held due to cellcept meds    Colonoscopy and pap get [place], [place] and pap [place] signed today  -colonoscopy 07/05/14 hyperplastic rectal polyp -had 04/2020 ? [facility]  MMR immune   [facility]amm[facility] 04/03/20 negative   S/p hysterectomy fibroids cervix possibly intact will need to do pelvic and possible pap in future if h/o abnormal pap    Former smoker quantify how much and long at f/u  DEXA 04/03/20 osteopenia vit d and calcium    Hep B titer 8.7 and hec neg 12/25/17  Rec healthy diet and exercise   F/u dermatology Dr. Ferrel Logan [facility] Tomasa Blase today consider in future for local MD Eye exam 02/2017  [facility]  [facility] surg Dr. Kreg Shropshire  ColonoscopyThere is a scanned record from 12/02/2017 under the media tab that has her GI  records from the [place] from 07/05/14 colonoscopy with a hyperplastic rectal polyp removed   Provider: Dr. Olivia Mackie McLean-Scocuzza-Internal Medicine

## 2021-04-10 ENCOUNTER — Encounter: Payer: Self-pay | Admitting: Internal Medicine

## 2021-04-10 DIAGNOSIS — K449 Diaphragmatic hernia without obstruction or gangrene: Secondary | ICD-10-CM | POA: Insufficient documentation

## 2021-04-22 ENCOUNTER — Other Ambulatory Visit: Payer: Self-pay | Admitting: Internal Medicine

## 2021-04-22 ENCOUNTER — Other Ambulatory Visit: Payer: Self-pay

## 2021-04-22 DIAGNOSIS — E039 Hypothyroidism, unspecified: Secondary | ICD-10-CM

## 2021-04-22 DIAGNOSIS — R946 Abnormal results of thyroid function studies: Secondary | ICD-10-CM

## 2021-04-22 MED ORDER — LEVOTHYROXINE SODIUM 150 MCG PO TABS: 150.0000 ug | ORAL_TABLET | Freq: Every day | ORAL | Status: DC

## 2021-04-22 NOTE — Addendum Note (Signed)
Addended by: Orland Mustard on: 04/22/2021 09:28 AM ? ? Modules accepted: Orders ? ?

## 2021-04-22 NOTE — Progress Notes (Signed)
TSH has been ordered for repeat draw in 2 months.  ?

## 2021-04-23 ENCOUNTER — Other Ambulatory Visit: Payer: Self-pay | Admitting: Internal Medicine

## 2021-04-24 ENCOUNTER — Encounter: Payer: Self-pay | Admitting: Plastic Surgery

## 2021-04-24 ENCOUNTER — Ambulatory Visit (INDEPENDENT_AMBULATORY_CARE_PROVIDER_SITE_OTHER): Payer: BC Managed Care – PPO | Admitting: Plastic Surgery

## 2021-04-24 ENCOUNTER — Other Ambulatory Visit: Payer: Self-pay

## 2021-04-24 VITALS — BP 132/89 | HR 81 | Ht 62.0 in | Wt 201.0 lb

## 2021-04-24 DIAGNOSIS — M4004 Postural kyphosis, thoracic region: Secondary | ICD-10-CM

## 2021-04-24 DIAGNOSIS — M546 Pain in thoracic spine: Secondary | ICD-10-CM | POA: Diagnosis not present

## 2021-04-24 DIAGNOSIS — N62 Hypertrophy of breast: Secondary | ICD-10-CM

## 2021-04-24 DIAGNOSIS — M545 Low back pain, unspecified: Secondary | ICD-10-CM | POA: Diagnosis not present

## 2021-04-24 NOTE — Progress Notes (Signed)
? ?Referring Provider ?McLean-Scocuzza, Nino Glow, MD ?[address] ?[place],  [place] [address]  ? ?CC:  ?Chief Complaint  ?Patient presents with  ? Advice Only  ?   ? ?Helen Freeman is an 61 y.o. female.  ?HPI: Patient presents to discuss breast reduction.  She has had years of back pain, neck pain and shoulder grooving related to her large breast.  She is tried over-the-counter medications, warm packs, cold packs and supportive bras with little relief.  She has also been to a chiropractor and had numerous injections in her back for pain management none of which has given her any sustained relief.  She does not have a family history of breast cancer no previous breast biopsies or procedures.  She does not smoke and is not a diabetic. ? ?Allergies  ?Allergen Reactions  ? Plaquenil  [Hydroxychloroquine Sulfate] Rash  ? Lactose Intolerance (Gi) Diarrhea  ? Other Rash  ?  Sunlight  ?Polymorphous light eruption   ? Plaquenil [Hydroxychloroquine] Rash  ? ? ?Outpatient Encounter Medications as of 04/24/2021  ?Medication Sig  ? atorvastatin (LIPITOR) 20 MG tablet Take 1 tablet (20 mg total) by mouth daily.  ? Carboxymethylcellulose Sodium 0.25 % SOLN Administer 2 drops to both eyes daily as needed.  ? clobetasol ointment (TEMOVATE) 0.05 % clobetasol 0.05 % topical ointment  ? cyclobenzaprine (FLEXERIL) 5 MG tablet Take 5 mg by mouth 3 (three) times daily as needed for muscle spasms.  ? cycloSPORINE (RESTASIS) 0.05 % ophthalmic emulsion Administer 2 drops to both eyes every morning.  ? DULoxetine (CYMBALTA) 20 MG capsule Take 1 capsule (20 mg total) by mouth daily.  ? eszopiclone 3 MG TABS Take 1 tablet (3 mg total) by mouth at bedtime as needed for sleep. Take immediately before bedtime  ? fluticasone (FLONASE) 50 MCG/ACT nasal spray fluticasone propionate 50 mcg/actuation nasal spray,suspension  ? folic acid (FOLVITE) 1 MG tablet Take 1 mg by mouth daily.  ? gabapentin (NEURONTIN) 100 MG capsule Take 100 mg by mouth.  ?  HYDROcodone-acetaminophen (NORCO) 10-325 MG tablet Take 1 tablet by mouth 3 (three) times daily as needed.  ? hydrocortisone 2.5 % cream Apply topically 2 (two) times daily. Prn neck  ? ipratropium (ATROVENT) 0.06 % nasal spray Place 2 sprays into both nostrils 4 (four) times daily.  ? levocetirizine (XYZAL) 5 MG tablet Take 1 tablet (5 mg total) by mouth at bedtime as needed for allergies.  ? levothyroxine (SYNTHROID) 150 MCG tablet Take 1 tablet (150 mcg total) by mouth daily before breakfast. Managed by [facility] endocrine 04/2021 reduced dose from 175 to 150 mcg  ? Methotrexate 2.5 MG/ML SOLN 15 mg once a week.  ? morphine (MSIR) 15 MG tablet Take 15 mg by mouth every 4 (four) hours as needed.   ? pantoprazole (PROTONIX) 40 MG tablet Take 1 tablet (40 mg total) by mouth daily. At lunch 30 minutes before food  ? rizatriptan (MAXALT-MLT) 10 MG disintegrating tablet Take 1 tablet (10 mg total) by mouth as needed for migraine. May repeat in 2 hours if needed. No more than 2x per week. No more than 30 mg in 24 hrs  ? sodium chloride (OCEAN) 0.65 % SOLN nasal spray Place 2 sprays into both nostrils as needed for congestion.  ? tamsulosin (FLOMAX) 0.4 MG CAPS capsule Take 1 capsule (0.4 mg total) by mouth daily.  ? topiramate (TOPAMAX) 25 MG capsule Take 25 mg by mouth 2 (two) times daily.  ? topiramate (TOPAMAX) 25 MG tablet  TAKE 1 TABLET BY MOUTH TWICE DAILY  ? ?No facility-administered encounter medications on file as of 04/24/2021.  ?  ? ?Past Medical History:  ?Diagnosis Date  ? Anxiety   ? Arthritis   ? Chronic gastritis   ? egd 04/18/16 + chronic gastritis and EGD 06/16/16 neg H pylori   ? Depression   ? Discoid lupus   ? Eczema   ? Fibroids   ? Fibromyalgia   ? Gallstones   ? 11/25/17 [place]  ? Hashimoto's disease   ? Hyperlipemia   ? Hypothyroidism   ? Hypothyroidism   ? Kidney stone   ? left [facility] 2017 CT scan   ? Lateral epicondylitis   ? Lupus ([place])   ? Morphea   ? Morphea   ? Plantar fasciitis   ? PLE (polymorphic light  eruption)   ? Prediabetes   ? Sleep apnea   ? Urine incontinence   ? ? ?Past Surgical History:  ?Procedure Laterality Date  ? ABDOMINAL HYSTERECTOMY    ? uterus and both ovaries out per pt cervix intact h/o PID in 1986, scar tissue, hemorrhagic cysts; multiple pelvic surgeries 1986 to 2006   ? APPENDECTOMY    ? cervical radiofrequency    ? in [place]   ? ESOPHAGOGASTRODUODENOSCOPY    ? 02/27/20 s/p sleeve gastrectomy, diaphragmatic hiatal hernia 3 cm nl esophagus and duodenum 2nd part  ? EXTRACORPOREAL SHOCK WAVE LITHOTRIPSY Left 12/20/2015  ? Procedure: EXTRACORPOREAL SHOCK WAVE LITHOTRIPSY (ESWL);  Surgeon: Nickie Retort, MD;  Location: [facility] ORS;  Service: Urology;  Laterality: Left;  ? GALLBLADDER SURGERY    ? 02/2018 Dr. Darnell Level  ? HIATAL HERNIA REPAIR    ? 05/2019 Dr. Kreg Shropshire with complications hematoma  ? LAPAROSCOPIC GASTRIC SLEEVE RESECTION    ? 06/2016 Dr. Darnell Level   ? LAPAROSCOPIC TOTAL HYSTERECTOMY  2004  ? OTHER SURGICAL HISTORY    ? ROTATOR CUFF REPAIR Right   ? 02/2004  ? ? ?Family History  ?Problem Relation Age of Onset  ? Kidney cancer Maternal Grandmother   ? Hypertension Maternal Grandmother   ? Arthritis Mother   ? Hyperlipidemia Mother   ? Hypertension Mother   ? Heart disease Father   ? Early death Father   ? Prostate cancer Neg Hx   ? ? ?Social History  ? ?Social History Narrative  ? [employer]   ? 61 y.o twins   ? Married to wife   ? No guns, wears seat belt, safe in relationship   ? [place]  ?  ? ?Review of Systems ?General: Denies fevers, chills, weight loss ?CV: Denies chest pain, shortness of breath, palpitations ? ?Physical Exam ? ?  04/24/2021  ?  8:48 AM 04/09/2021  ?  7:49 AM 09/05/2020  ?  2:10 PM  ?Vitals with BMI  ?Height '5\' 2"'$  5' 1.34" 5' 1.339"  ?Weight 201 lbs 203 lbs 13 oz 196 lbs  ?BMI 36.75 38.08 36.63  ?Systolic 096 283 662  ?Diastolic 89 84 84  ?Pulse 81 68 70  ?  ?General:  No acute distress,  Alert and oriented, Non-Toxic, Normal speech and  affect ?Breast: She has grade 3 ptosis.  Sternal notch to nipple is 30 cm bilaterally.  Nipple to fold is 15 cm bilaterally.  No obvious scars or masses. ? ?Assessment/Plan ?The patient has bilateral symptomatic macromastia.  She is a good candidate for a breast reduction.  She is interested in pursuing surgical treatment.  She has tried supportive garments and fitted bras with no relief.  The details of breast reduction surgery were discussed.  I explained the procedure in detail along the with the expected scars.  The risks were discussed in detail and include bleeding, infection, damage to surrounding structures, need for additional procedures, nipple loss, change in nipple sensation, persistent pain, contour irregularities and asymmetries.  I explained that breast feeding is often not possible after breast reduction surgery.  We discussed the expected postoperative course with an overall recovery period of about 1 month.  She demonstrated full understanding of all risks.  We discussed her personal risk factors.  The patient is interested in pursuing surgical treatment. ? ?I anticipate approximately 630g of tissue removed from each side. ? ? ?Cindra Presume ?04/24/2021, 9:20 AM  ? ? ?  ?

## 2021-04-25 ENCOUNTER — Ambulatory Visit
Admission: RE | Admit: 2021-04-25 | Discharge: 2021-04-25 | Disposition: A | Discharge status: Home / Self Care | Payer: BC Managed Care – PPO | Source: Ambulatory Visit | Attending: Internal Medicine | Admitting: Internal Medicine

## 2021-04-25 DIAGNOSIS — R748 Abnormal levels of other serum enzymes: Secondary | ICD-10-CM

## 2021-04-25 DIAGNOSIS — R7989 Other specified abnormal findings of blood chemistry: Secondary | ICD-10-CM | POA: Diagnosis not present

## 2021-04-29 ENCOUNTER — Encounter: Payer: Self-pay | Admitting: Internal Medicine

## 2021-04-29 DIAGNOSIS — K760 Fatty (change of) liver, not elsewhere classified: Secondary | ICD-10-CM | POA: Insufficient documentation

## 2021-04-29 DIAGNOSIS — M9903 Segmental and somatic dysfunction of lumbar region: Secondary | ICD-10-CM | POA: Diagnosis not present

## 2021-04-29 DIAGNOSIS — M9904 Segmental and somatic dysfunction of sacral region: Secondary | ICD-10-CM | POA: Diagnosis not present

## 2021-04-29 DIAGNOSIS — M461 Sacroiliitis, not elsewhere classified: Secondary | ICD-10-CM | POA: Diagnosis not present

## 2021-04-29 DIAGNOSIS — M5441 Lumbago with sciatica, right side: Secondary | ICD-10-CM | POA: Diagnosis not present

## 2021-05-01 NOTE — Addendum Note (Signed)
Addended by: Orland Mustard on: 05/01/2021 01:50 PM ? ? Modules accepted: Orders ? ?

## 2021-05-02 DIAGNOSIS — M9904 Segmental and somatic dysfunction of sacral region: Secondary | ICD-10-CM | POA: Diagnosis not present

## 2021-05-02 DIAGNOSIS — M9903 Segmental and somatic dysfunction of lumbar region: Secondary | ICD-10-CM | POA: Diagnosis not present

## 2021-05-02 DIAGNOSIS — M5441 Lumbago with sciatica, right side: Secondary | ICD-10-CM | POA: Diagnosis not present

## 2021-05-02 DIAGNOSIS — M461 Sacroiliitis, not elsewhere classified: Secondary | ICD-10-CM | POA: Diagnosis not present

## 2021-05-10 DIAGNOSIS — J029 Acute pharyngitis, unspecified: Secondary | ICD-10-CM | POA: Diagnosis not present

## 2021-05-10 DIAGNOSIS — Z20818 Contact with and (suspected) exposure to other bacterial communicable diseases: Secondary | ICD-10-CM | POA: Diagnosis not present

## 2021-05-10 DIAGNOSIS — H9201 Otalgia, right ear: Secondary | ICD-10-CM | POA: Diagnosis not present

## 2021-05-28 DIAGNOSIS — Z79899 Other long term (current) drug therapy: Secondary | ICD-10-CM | POA: Diagnosis not present

## 2021-05-28 DIAGNOSIS — R7989 Other specified abnormal findings of blood chemistry: Secondary | ICD-10-CM | POA: Diagnosis not present

## 2021-05-28 DIAGNOSIS — Z87898 Personal history of other specified conditions: Secondary | ICD-10-CM | POA: Diagnosis not present

## 2021-05-28 DIAGNOSIS — K760 Fatty (change of) liver, not elsewhere classified: Secondary | ICD-10-CM | POA: Diagnosis not present

## 2021-06-03 ENCOUNTER — Encounter: Payer: Self-pay | Admitting: Plastic Surgery

## 2021-06-05 ENCOUNTER — Ambulatory Visit
Admission: RE | Admit: 2021-06-05 | Discharge: 2021-06-05 | Disposition: A | Discharge status: Home / Self Care | Payer: BC Managed Care – PPO | Source: Ambulatory Visit | Attending: Internal Medicine | Admitting: Internal Medicine

## 2021-06-05 DIAGNOSIS — R748 Abnormal levels of other serum enzymes: Secondary | ICD-10-CM

## 2021-06-05 DIAGNOSIS — R1084 Generalized abdominal pain: Secondary | ICD-10-CM

## 2021-06-05 DIAGNOSIS — K760 Fatty (change of) liver, not elsewhere classified: Secondary | ICD-10-CM

## 2021-06-06 ENCOUNTER — Encounter: Payer: Self-pay | Admitting: Internal Medicine

## 2021-06-06 DIAGNOSIS — I700 Atherosclerosis of aorta: Secondary | ICD-10-CM | POA: Insufficient documentation

## 2021-06-06 DIAGNOSIS — N281 Cyst of kidney, acquired: Secondary | ICD-10-CM | POA: Insufficient documentation

## 2021-06-06 DIAGNOSIS — N200 Calculus of kidney: Secondary | ICD-10-CM | POA: Insufficient documentation

## 2021-06-24 ENCOUNTER — Other Ambulatory Visit (INDEPENDENT_AMBULATORY_CARE_PROVIDER_SITE_OTHER): Payer: BC Managed Care – PPO

## 2021-06-24 ENCOUNTER — Telehealth: Payer: Self-pay

## 2021-06-24 DIAGNOSIS — R946 Abnormal results of thyroid function studies: Secondary | ICD-10-CM

## 2021-06-24 NOTE — Telephone Encounter (Signed)
Faxed follow up for request of medical records for pain treatment. Tried to call office, but couldn't get through.

## 2021-06-24 NOTE — Telephone Encounter (Signed)
Faxed Release of Records 05/02/21. Office staff verified they never received it. Verified fax number that is correct. Refaxing.

## 2021-06-25 ENCOUNTER — Other Ambulatory Visit: Payer: Self-pay

## 2021-06-25 DIAGNOSIS — E039 Hypothyroidism, unspecified: Secondary | ICD-10-CM

## 2021-06-25 LAB — TSH: TSH: 2.56 u[IU]/mL (ref 0.35–5.50)

## 2021-06-25 MED ORDER — LEVOTHYROXINE SODIUM 175 MCG PO TABS: 150.0000 ug | ORAL_TABLET | Freq: Every day | ORAL | Status: DC

## 2021-07-24 ENCOUNTER — Other Ambulatory Visit: Payer: Self-pay | Admitting: Family

## 2021-07-24 DIAGNOSIS — K760 Fatty (change of) liver, not elsewhere classified: Secondary | ICD-10-CM

## 2021-07-31 ENCOUNTER — Ambulatory Visit
Admission: RE | Admit: 2021-07-31 | Discharge: 2021-07-31 | Disposition: A | Discharge status: Home / Self Care | Payer: No Typology Code available for payment source | Source: Ambulatory Visit | Attending: Family | Admitting: Family

## 2021-07-31 DIAGNOSIS — K760 Fatty (change of) liver, not elsewhere classified: Secondary | ICD-10-CM | POA: Diagnosis present

## 2021-10-11 ENCOUNTER — Other Ambulatory Visit: Payer: Self-pay | Admitting: Internal Medicine

## 2021-10-11 ENCOUNTER — Ambulatory Visit (INDEPENDENT_AMBULATORY_CARE_PROVIDER_SITE_OTHER): Payer: 59 | Admitting: Internal Medicine

## 2021-10-11 ENCOUNTER — Encounter: Payer: Self-pay | Admitting: Internal Medicine

## 2021-10-11 ENCOUNTER — Telehealth: Payer: Self-pay | Admitting: Internal Medicine

## 2021-10-11 VITALS — BP 124/70 | HR 64 | Temp 98.0°F | Ht 61.22 in | Wt 204.8 lb

## 2021-10-11 DIAGNOSIS — F419 Anxiety disorder, unspecified: Secondary | ICD-10-CM

## 2021-10-11 DIAGNOSIS — Z0000 Encounter for general adult medical examination without abnormal findings: Secondary | ICD-10-CM

## 2021-10-11 DIAGNOSIS — T7840XD Allergy, unspecified, subsequent encounter: Secondary | ICD-10-CM

## 2021-10-11 DIAGNOSIS — R7303 Prediabetes: Secondary | ICD-10-CM | POA: Diagnosis not present

## 2021-10-11 DIAGNOSIS — E611 Iron deficiency: Secondary | ICD-10-CM | POA: Diagnosis not present

## 2021-10-11 DIAGNOSIS — J309 Allergic rhinitis, unspecified: Secondary | ICD-10-CM

## 2021-10-11 DIAGNOSIS — F5104 Psychophysiologic insomnia: Secondary | ICD-10-CM

## 2021-10-11 DIAGNOSIS — Z23 Encounter for immunization: Secondary | ICD-10-CM

## 2021-10-11 DIAGNOSIS — G4700 Insomnia, unspecified: Secondary | ICD-10-CM

## 2021-10-11 DIAGNOSIS — E039 Hypothyroidism, unspecified: Secondary | ICD-10-CM | POA: Diagnosis not present

## 2021-10-11 DIAGNOSIS — Z1389 Encounter for screening for other disorder: Secondary | ICD-10-CM

## 2021-10-11 DIAGNOSIS — R748 Abnormal levels of other serum enzymes: Secondary | ICD-10-CM

## 2021-10-11 DIAGNOSIS — Z13220 Encounter for screening for lipoid disorders: Secondary | ICD-10-CM | POA: Diagnosis not present

## 2021-10-11 DIAGNOSIS — F32A Depression, unspecified: Secondary | ICD-10-CM

## 2021-10-11 DIAGNOSIS — G43909 Migraine, unspecified, not intractable, without status migrainosus: Secondary | ICD-10-CM

## 2021-10-11 DIAGNOSIS — F339 Major depressive disorder, recurrent, unspecified: Secondary | ICD-10-CM

## 2021-10-11 LAB — COMPREHENSIVE METABOLIC PANEL
ALT: 14 U/L (ref 0–35)
AST: 16 U/L (ref 0–37)
Albumin: 3.9 g/dL (ref 3.5–5.2)
Alkaline Phosphatase: 115 U/L (ref 39–117)
BUN: 15 mg/dL (ref 6–23)
CO2: 29 mEq/L (ref 19–32)
Calcium: 9 mg/dL (ref 8.4–10.5)
Chloride: 105 mEq/L (ref 96–112)
Creatinine, Ser: 0.83 mg/dL (ref 0.40–1.20)
GFR: 76.56 mL/min (ref >60.00)
Glucose, Bld: 91 mg/dL (ref 70–99)
Potassium: 4.1 mEq/L (ref 3.5–5.1)
Sodium: 141 mEq/L (ref 135–145)
Total Bilirubin: 0.6 mg/dL (ref 0.2–1.2)
Total Protein: 7.1 g/dL (ref 6.0–8.3)

## 2021-10-11 LAB — CBC WITH DIFFERENTIAL/PLATELET
Basophils Absolute: 0.1 10*3/uL (ref 0.0–0.1)
Basophils Relative: 0.9 % (ref 0.0–3.0)
Eosinophils Absolute: 0.1 10*3/uL (ref 0.0–0.7)
Eosinophils Relative: 1.5 % (ref 0.0–5.0)
HCT: 43.5 % (ref 36.0–46.0)
Hemoglobin: 14.2 g/dL (ref 12.0–15.0)
Lymphocytes Relative: 27.2 % (ref 12.0–46.0)
Lymphs Abs: 1.6 10*3/uL (ref 0.7–4.0)
MCHC: 32.8 g/dL (ref 30.0–36.0)
MCV: 92 fl (ref 78.0–100.0)
Monocytes Absolute: 0.5 10*3/uL (ref 0.1–1.0)
Monocytes Relative: 7.9 % (ref 3.0–12.0)
Neutro Abs: 3.7 10*3/uL (ref 1.4–7.7)
Neutrophils Relative %: 62.5 % (ref 43.0–77.0)
Platelets: 160 10*3/uL (ref 150.0–400.0)
RBC: 4.73 Mil/uL (ref 3.87–5.11)
RDW: 15.2 % (ref 11.5–15.5)
WBC: 6 10*3/uL (ref 4.0–10.5)

## 2021-10-11 LAB — IBC + FERRITIN
Ferritin: 52.8 ng/mL (ref 10.0–291.0)
Iron: 91 ug/dL (ref 42–145)
Saturation Ratios: 29.5 % (ref 20.0–50.0)
TIBC: 308 ug/dL (ref 250.0–450.0)
Transferrin: 220 mg/dL (ref 212.0–360.0)

## 2021-10-11 LAB — HEMOGLOBIN A1C: Hgb A1c MFr Bld: 6.3 % (ref 4.6–6.5)

## 2021-10-11 LAB — LIPID PANEL
Cholesterol: 271 mg/dL — ABNORMAL HIGH (ref 0–200)
HDL: 69.4 mg/dL (ref >39.00)
LDL Cholesterol: 181 mg/dL — ABNORMAL HIGH (ref 0–99)
NonHDL: 201.69
Total CHOL/HDL Ratio: 4
Triglycerides: 103 mg/dL (ref 0.0–149.0)
VLDL: 20.6 mg/dL (ref 0.0–40.0)

## 2021-10-11 LAB — TSH: TSH: 123.02 u[IU]/mL — ABNORMAL HIGH (ref 0.35–5.50)

## 2021-10-11 MED ORDER — DULOXETINE HCL 20 MG PO CPEP: 20.0000 mg | ORAL_CAPSULE | Freq: Every day | ORAL | 3 refills | Status: DC

## 2021-10-11 MED ORDER — TRAZODONE HCL 50 MG PO TABS: 25.0000 mg | ORAL_TABLET | Freq: Every evening | ORAL | 5 refills | Status: DC | PRN

## 2021-10-11 MED ORDER — RIZATRIPTAN BENZOATE 10 MG PO TBDP: 10.0000 mg | ORAL_TABLET | ORAL | 11 refills | Status: AC | PRN

## 2021-10-11 MED ORDER — TOPIRAMATE 25 MG PO TABS: 25.0000 mg | ORAL_TABLET | Freq: Two times a day (BID) | ORAL | 3 refills | Status: DC

## 2021-10-11 MED ORDER — IPRATROPIUM BROMIDE 0.06 % NA SOLN: 2.0000 | Freq: Four times a day (QID) | NASAL | 13 refills | Status: AC

## 2021-10-11 MED ORDER — ATORVASTATIN CALCIUM 20 MG PO TABS
20.0000 mg | ORAL_TABLET | Freq: Every day | ORAL | 3 refills | Status: AC
Start: 2021-10-11 — End: ?

## 2021-10-11 MED ORDER — LEVOCETIRIZINE DIHYDROCHLORIDE 5 MG PO TABS: 5.0000 mg | ORAL_TABLET | Freq: Every evening | ORAL | 3 refills | Status: DC | PRN

## 2021-10-11 MED ORDER — ESZOPICLONE 3 MG PO TABS: 3.0000 mg | ORAL_TABLET | Freq: Every evening | ORAL | 1 refills | Status: AC | PRN

## 2021-10-11 MED ORDER — SALINE SPRAY 0.65 % NA SOLN: 2.0000 | NASAL | 12 refills | Status: DC | PRN

## 2021-10-11 MED ORDER — FLUTICASONE PROPIONATE 50 MCG/ACT NA SUSP: 2.0000 | Freq: Every day | NASAL | 11 refills | Status: AC | PRN

## 2021-10-11 MED ORDER — CYCLOBENZAPRINE HCL 5 MG PO TABS
5.0000 mg | ORAL_TABLET | Freq: Three times a day (TID) | ORAL | 5 refills | Status: DC | PRN
Start: 2021-10-11 — End: 2022-01-17

## 2021-10-11 MED ORDER — FLUTICASONE PROPIONATE 50 MCG/ACT NA SUSP: NASAL | 11 refills | Status: DC

## 2021-10-11 MED ORDER — LEVOTHYROXINE SODIUM 150 MCG PO TABS: 150.0000 ug | ORAL_TABLET | Freq: Every day | ORAL | 3 refills | Status: DC

## 2021-10-11 NOTE — Progress Notes (Signed)
Chief Complaint  Patient presents with   Annual Exam   Annual  1.elevated lfts  2. Hypothyroidism levo 150 mcg qd 3. Allergies wants referral allergy in [place]    Review of Systems  Constitutional:  Negative for weight loss.  HENT:  Negative for hearing loss.   Eyes:  Negative for blurred vision.  Respiratory:  Negative for shortness of breath.   Cardiovascular:  Negative for chest pain.  Gastrointestinal:  Negative for abdominal pain and blood in stool.  Genitourinary:  Negative for dysuria.  Musculoskeletal:  Negative for falls and joint pain.  Skin:  Negative for rash.  Neurological:  Negative for headaches.  Psychiatric/Behavioral:  Negative for depression.    Past Medical History:  Diagnosis Date   Anxiety    Arthritis    Chronic gastritis    egd 04/18/16 + chronic gastritis and EGD 06/16/16 neg H pylori    Depression    Discoid lupus    Eczema    Fibroids    Fibromyalgia    Gallstones    11/25/17 [place]   Hashimoto's disease    Hyperlipemia    Hypothyroidism    Hypothyroidism    Kidney stone    left [facility] 2017 CT scan    Lateral epicondylitis    Lupus ([place])    Morphea    Morphea    Plantar fasciitis    PLE (polymorphic light eruption)    Prediabetes    Sleep apnea    Urine incontinence    Past Surgical History:  Procedure Laterality Date   ABDOMINAL HYSTERECTOMY     uterus and both ovaries out per pt cervix intact h/o PID in 1986, scar tissue, hemorrhagic cysts; multiple pelvic surgeries 1986 to 2006    APPENDECTOMY     cervical radiofrequency     in [place]    ESOPHAGOGASTRODUODENOSCOPY     02/27/20 s/p sleeve gastrectomy, diaphragmatic hiatal hernia 3 cm nl esophagus and duodenum 2nd part   EXTRACORPOREAL SHOCK WAVE LITHOTRIPSY Left 12/20/2015   Procedure: EXTRACORPOREAL SHOCK WAVE LITHOTRIPSY (ESWL);  Surgeon: Nickie Retort, MD;  Location: [facility] ORS;  Service: Urology;  Laterality: Left;   GALLBLADDER SURGERY     02/2018 Dr. Darnell Level   HIATAL HERNIA REPAIR      05/2019 Dr. Kreg Shropshire with complications hematoma   LAPAROSCOPIC GASTRIC SLEEVE RESECTION     06/2016 Dr. Darnell Level    LAPAROSCOPIC TOTAL HYSTERECTOMY  2004   OTHER SURGICAL HISTORY     ROTATOR CUFF REPAIR Right    02/2004   Family History  Problem Relation Age of Onset   Kidney cancer Maternal Grandmother    Hypertension Maternal Grandmother    Arthritis Mother    Hyperlipidemia Mother    Hypertension Mother    Heart disease Father    Early death Father    Prostate cancer Neg Hx    Social History   Socioeconomic History   Marital status: Married    Spouse name: Not on file   Number of children: Not on file   Years of education: Not on file   Highest education level: Not on file  Occupational History   Not on file  Tobacco Use   Smoking status: Former   Smokeless tobacco: Never  Substance and Sexual Activity   Alcohol use: Yes   Drug use: No   Sexual activity: Not Currently    Comment: female   Other Topics Concern   Not on file  Social History Narrative   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   clobetasol ointment (TEMOVATE) 0.05 % clobetasol 0.05 % topical ointment   cycloSPORINE (RESTASIS) 0.05 % ophthalmic emulsion Administer 2 drops to both eyes every morning.   folic acid (FOLVITE) 1 MG tablet Take 1 mg by mouth daily.   gabapentin (NEURONTIN) 100 MG capsule Take 100 mg by mouth.   HYDROcodone-acetaminophen (NORCO) 10-325  MG tablet Take 1 tablet by mouth 3 (three) times daily as needed.   hydrocortisone 2.5 % cream Apply topically 2 (two) times daily. Prn neck   Methotrexate 2.5 MG/ML SOLN 15 mg once a week.   morphine (MSIR) 15 MG tablet Take 15 mg by mouth every 4 (four) hours as needed.    traZODone (DESYREL) 50 MG tablet Take 0.5-1 tablets (25-50 mg total) by mouth at bedtime as needed for sleep.   [DISCONTINUED] atorvastatin (LIPITOR) 20 MG tablet Take 1 tablet (20 mg total) by mouth daily.   [DISCONTINUED] cyclobenzaprine (FLEXERIL) 5 MG tablet Take 5 mg by mouth 3 (three) times daily as needed for muscle spasms.   [DISCONTINUED] DULoxetine (CYMBALTA) 20 MG capsule Take 1 capsule (20 mg total) by mouth daily.   [DISCONTINUED] eszopiclone 3 MG TABS Take 1 tablet (3 mg total) by mouth at bedtime as needed for sleep. Take immediately before bedtime   [DISCONTINUED] fluticasone (FLONASE) 50 MCG/ACT nasal spray fluticasone propionate 50 mcg/actuation nasal spray,suspension   [DISCONTINUED] ipratropium (ATROVENT) 0.06 % nasal spray Place 2 sprays into both nostrils 4 (four) times daily.   [DISCONTINUED] levocetirizine (XYZAL) 5 MG tablet Take 1 tablet (5 mg total) by mouth at bedtime as needed for allergies.   [DISCONTINUED] levothyroxine (SYNTHROID) 175 MCG tablet Take 1 tablet (175 mcg total) by mouth daily before breakfast. Managed by [facility] endocrine 04/2021 reduced dose from 175 to 150 mcg   [DISCONTINUED] pantoprazole (PROTONIX) 40 MG tablet Take 1 tablet (40 mg total) by mouth daily. At lunch 30 minutes before food   [DISCONTINUED] rizatriptan (MAXALT-MLT) 10 MG disintegrating tablet Take 1 tablet (10 mg total) by mouth as needed for migraine. May repeat in 2 hours if needed. No more than 2x per week. No more than 30 mg in 24 hrs   [DISCONTINUED] sodium chloride (OCEAN) 0.65 % SOLN nasal spray Place 2 sprays into both nostrils as needed for congestion.   [DISCONTINUED] tamsulosin (FLOMAX) 0.4 MG CAPS capsule Take 1  capsule (0.4 mg total) by mouth daily.   [DISCONTINUED] topiramate (TOPAMAX) 25 MG tablet TAKE 1 TABLET BY MOUTH TWICE DAILY   Allergies  Allergen Reactions   Plaquenil  [Hydroxychloroquine Sulfate] Rash   Lactose Intolerance (Gi) Diarrhea   Other Rash    Sunlight  Polymorphous light eruption    Plaquenil [Hydroxychloroquine] Rash   No results found for this or any previous visit (from the past 2160 hour(s)). Objective  Body mass index is  38.42 kg/m. Wt Readings from Last 3 Encounters:  10/11/21 204 lb 12.8 oz (92.9 kg)  04/24/21 201 lb (91.2 kg)  04/09/21 203 lb 12.8 oz (92.4 kg)   Temp Readings from Last 3 Encounters:  10/11/21 98 F (36.7 C) (Oral)  04/09/21 97.8 F (36.6 C) (Oral)  09/05/20 (!) 96.7 F (35.9 C) (Temporal)   BP Readings from Last 3 Encounters:  10/11/21 124/70  04/24/21 132/89  04/09/21 128/84   Pulse Readings from Last 3 Encounters:  10/11/21 64  04/24/21 81  04/09/21 68    Physical Exam Vitals and nursing note reviewed.  Constitutional:      Appearance: Normal appearance. She is well-developed and well-groomed.  HENT:     Head: Normocephalic and atraumatic.  Eyes:     Conjunctiva/sclera: Conjunctivae normal.     Pupils: Pupils are equal, round, and reactive to light.  Cardiovascular:     Rate and Rhythm: Normal rate and regular rhythm.     Heart sounds: Normal heart sounds. No murmur heard. Pulmonary:     Effort: Pulmonary effort is normal.     Breath sounds: Normal breath sounds.  Abdominal:     General: Abdomen is flat. Bowel sounds are normal.     Tenderness: There is no abdominal tenderness.  Musculoskeletal:        General: No tenderness.  Skin:    General: Skin is warm and dry.  Neurological:     General: No focal deficit present.     Mental Status: She is alert and oriented to person, place, and time. Mental status is at baseline.     Cranial Nerves: Cranial nerves 2-12 are intact.     Motor: Motor function is intact.      Coordination: Coordination is intact.     Gait: Gait is intact.  Psychiatric:        Attention and Perception: Attention and perception normal.        Mood and Affect: Mood and affect normal.        Speech: Speech normal.        Behavior: Behavior normal. Behavior is cooperative.        Thought Content: Thought content normal.        Cognition and Memory: Cognition and memory normal.        Judgment: Judgment normal.     Assessment  Plan  Annual physical exam - Plan: Comprehensive metabolic panel, Lipid panel, CBC with Differential/Platelet, Hemoglobin A1c, TSH, IBC + Ferritin, Urinalysis, Routine w reflex microscopic,  Elevated liver enzymes Iron deficiency - Plan: IBC + Ferritin   Prediabetes - Plan: Hemoglobin A1c  Hypothyroidism, unspecified type - Plan: TSH, levothyroxine (SYNTHROID) 150 MCG tablet  Chronic insomnia - Plan: traZODone (DESYREL) 50 MG tablet Add to lunesta 3 mg qhs  F/u psych [facility]   Depression, recurrent (HCC) - Plan: traZODone (DESYREL) 50 MG tablet  Migraine without status migrainosus, not intractable, unspecified migraine type - Plan: rizatriptan (MAXALT-MLT) 10 MG disintegrating tablet  Allergic rhinitis, unspecified seasonality, unspecified trigger - Plan: sodium chloride (OCEAN) 0.65 % SOLN nasal spray, ipratropium (ATROVENT) 0.06 % nasal spray, levocetirizine (XYZAL) 5 MG tablet, Ambulatory referral to Allergy  Anxiety and depression - Plan: DULoxetine (CYMBALTA) 20 MG capsule Insomnia, unspecified type - Plan: Eszopiclone 3 MG TABS See above  Allergy, subsequent encounter - Plan: Ambulatory referral to Allergy   HM Flu shot given today covid 4/4  Shingrix 2/2 Tdap 04/18/13 and hep B vx held due to cellcept meds  Colonoscopy and pap get [place] and [facility] [place] signed today  -colonoscopy 07/05/14 hyperplastic rectal polyp -had 04/2020 ? [facility] will [facility] copy  MMR immune   Mamm[facility] 04/04/21 negative    S/p  hysterectomy fibroids cervix possibly intact will need to do pelvic and possible pap in future if h/o abnormal pap  Consider do pap in the future   Former smoker quantify how much and long at f/u  DEXA 04/03/20 osteopenia vit d and calcium    Hep B titer 8.7 and hec neg 12/25/17  Rec healthy diet and exercise   F/u dermatology Dr. Ferrel Logan [facility] Tomasa Blase today consider in future for local MD Eye exam 02/2017  [facility]  Gen surg Dr. Kreg Shropshire  ColonoscopyThere is a scanned record from 12/02/2017 under the media tab that has her GI records from the [place] from 07/05/14 colonoscopy with a hyperplastic rectal polyp removed   Provider: Dr. Olivia Mackie McLean-Scocuzza-Internal Medicine

## 2021-10-11 NOTE — Patient Instructions (Addendum)
[facility] as given the info before for both  [facility] counseling and psychiatry [place]  [place] [phone]    [facility] psychiat[facility] [place]  [address]  [place] [address]  [phone]   Let me know dose of gabapentin   Allergy Dr. Neldon Mc Phone Fax E-mail Address  [phone] [email or web address] [place] [address]

## 2021-10-11 NOTE — Telephone Encounter (Signed)
[facility] called stating the medication Flonase that was sent over has no directions on it.

## 2021-10-13 ENCOUNTER — Other Ambulatory Visit: Payer: Self-pay | Admitting: Internal Medicine

## 2021-10-13 DIAGNOSIS — E039 Hypothyroidism, unspecified: Secondary | ICD-10-CM

## 2021-10-13 MED ORDER — LEVOTHYROXINE SODIUM 175 MCG PO TABS: 175.0000 ug | ORAL_TABLET | Freq: Every day | ORAL | 3 refills | Status: DC

## 2021-10-14 ENCOUNTER — Telehealth: Payer: Self-pay | Admitting: Internal Medicine

## 2021-10-14 ENCOUNTER — Other Ambulatory Visit: Payer: Self-pay | Admitting: Internal Medicine

## 2021-10-14 DIAGNOSIS — E785 Hyperlipidemia, unspecified: Secondary | ICD-10-CM

## 2021-10-14 DIAGNOSIS — E039 Hypothyroidism, unspecified: Secondary | ICD-10-CM

## 2021-10-14 DIAGNOSIS — F419 Anxiety disorder, unspecified: Secondary | ICD-10-CM

## 2021-10-14 MED ORDER — DULOXETINE HCL 60 MG PO CPEP: 60.0000 mg | ORAL_CAPSULE | Freq: Every day | ORAL | Status: DC

## 2021-10-14 NOTE — Telephone Encounter (Signed)
Pt needs repeat TSH/cholesterol in  91 days and f/u with Dr. Volanda Napoleon re thyroid labs and cholesterol after labs please schedule

## 2021-10-15 ENCOUNTER — Telehealth: Payer: Self-pay

## 2021-10-15 NOTE — Telephone Encounter (Signed)
LMOM for pt to CB in regards to what Dr. Olivia Mackie said and asked:      McLean-Scocuzza, Helen Glow, MD  Gracy Racer, [place] She still will need to f/u with endocrine in 6-8 weeks for her thyroid or our clinic repeat lab and f/u with endocrine asap after this her tsh is critically high and this can cause a coma  Ive ordered tsh   Who increased cymbalta dose is she seeing psychiatry and have they changed any other meds ? Psychiatry or the [place] ?

## 2021-10-16 ENCOUNTER — Other Ambulatory Visit: Payer: Self-pay | Admitting: Internal Medicine

## 2021-10-16 DIAGNOSIS — G894 Chronic pain syndrome: Secondary | ICD-10-CM

## 2021-10-16 MED ORDER — GABAPENTIN 100 MG PO CAPS: 100.0000 mg | ORAL_CAPSULE | Freq: Every day | ORAL | 3 refills | Status: AC

## 2021-10-17 ENCOUNTER — Other Ambulatory Visit (HOSPITAL_COMMUNITY): Payer: Self-pay

## 2021-11-01 ENCOUNTER — Other Ambulatory Visit: Payer: 59

## 2021-11-01 DIAGNOSIS — Z0000 Encounter for general adult medical examination without abnormal findings: Secondary | ICD-10-CM

## 2021-11-01 DIAGNOSIS — Z1389 Encounter for screening for other disorder: Secondary | ICD-10-CM

## 2021-11-02 LAB — URINALYSIS, ROUTINE W REFLEX MICROSCOPIC
Bilirubin Urine: NEGATIVE
Glucose, UA: NEGATIVE
Hgb urine dipstick: NEGATIVE
Ketones, ur: NEGATIVE
Leukocytes,Ua: NEGATIVE
Nitrite: NEGATIVE
Protein, ur: NEGATIVE
Specific Gravity, Urine: 1.007 (ref 1.001–1.035)
pH: 7.5 (ref 5.0–8.0)

## 2021-11-22 ENCOUNTER — Other Ambulatory Visit (INDEPENDENT_AMBULATORY_CARE_PROVIDER_SITE_OTHER): Payer: 59

## 2021-11-22 DIAGNOSIS — E039 Hypothyroidism, unspecified: Secondary | ICD-10-CM

## 2021-11-22 NOTE — Addendum Note (Signed)
Addended by: Leeanne Rio on: 11/22/2021 10:04 AM   Modules accepted: Orders

## 2021-11-23 LAB — TSH: TSH: 0.203 u[IU]/mL — ABNORMAL LOW (ref 0.450–4.500)

## 2021-11-25 ENCOUNTER — Other Ambulatory Visit: Payer: Self-pay

## 2021-11-25 MED ORDER — LEVOTHYROXINE SODIUM 150 MCG PO TABS: 150.0000 ug | ORAL_TABLET | Freq: Every day | ORAL | 1 refills | Status: DC

## 2021-12-20 ENCOUNTER — Encounter: Admitting: Family

## 2022-01-17 ENCOUNTER — Ambulatory Visit (INDEPENDENT_AMBULATORY_CARE_PROVIDER_SITE_OTHER): Payer: 59 | Admitting: Family

## 2022-01-17 ENCOUNTER — Encounter: Payer: Self-pay | Admitting: Family

## 2022-01-17 VITALS — BP 130/72 | HR 72 | Temp 98.2°F | Ht 62.0 in | Wt 203.2 lb

## 2022-01-17 DIAGNOSIS — G8929 Other chronic pain: Secondary | ICD-10-CM

## 2022-01-17 DIAGNOSIS — G4700 Insomnia, unspecified: Secondary | ICD-10-CM | POA: Diagnosis not present

## 2022-01-17 DIAGNOSIS — F339 Major depressive disorder, recurrent, unspecified: Secondary | ICD-10-CM

## 2022-01-17 DIAGNOSIS — F32A Depression, unspecified: Secondary | ICD-10-CM

## 2022-01-17 DIAGNOSIS — M797 Fibromyalgia: Secondary | ICD-10-CM | POA: Diagnosis not present

## 2022-01-17 DIAGNOSIS — F419 Anxiety disorder, unspecified: Secondary | ICD-10-CM

## 2022-01-17 DIAGNOSIS — F5104 Psychophysiologic insomnia: Secondary | ICD-10-CM

## 2022-01-17 DIAGNOSIS — L930 Discoid lupus erythematosus: Secondary | ICD-10-CM

## 2022-01-17 MED ORDER — CYCLOBENZAPRINE HCL 5 MG PO TABS: 5.0000 mg | ORAL_TABLET | Freq: Three times a day (TID) | ORAL | 5 refills | Status: AC | PRN

## 2022-01-17 MED ORDER — TRAZODONE HCL 50 MG PO TABS: 50.0000 mg | ORAL_TABLET | Freq: Every day | ORAL | 3 refills | Status: DC

## 2022-01-17 NOTE — Patient Instructions (Signed)
Hold lunesta to keep things simple  Start trazodone '50mg'$  every night   Referral to [facility] of the left hand you got 0 take care

## 2022-01-17 NOTE — Assessment & Plan Note (Addendum)
Suboptimal control.  Advised to take trazodone 50 mg nightly as this is also helpful for depression.  Advised for her to stop Lunesta while she is on trazodone.  Advised she will need to follow-up every 3 months me to prescribe Lunesta.  Counseled on avoiding alcohol on lunesta and trazodone.  moving will complete controlled substance contract at follow-up  if she continues lunesta. continue Cymbalta 60 mg daily.

## 2022-01-17 NOTE — Assessment & Plan Note (Addendum)
Suboptimal control. Start trazodone '50mg'$  qhs versus prn. Continue cymbalta '60mg'$  qd.  Referral to counseling.

## 2022-01-17 NOTE — Progress Notes (Signed)
Assessment & Plan:  Anxiety and depression Assessment & Plan: Suboptimal control. Start trazodone '50mg'$  qhs versus prn. Continue cymbalta '60mg'$  qd.  Referral to counseling.   Orders: -     Ambulatory referral to Psychology -     traZODone HCl; Take 1 tablet (50 mg total) by mouth at bedtime. Sleep, depression  Dispense: 90 tablet; Refill: 3  Insomnia, unspecified type Assessment & Plan: Suboptimal control.  Advised to take trazodone 50 mg nightly as this is also helpful for depression.  Advised for her to stop Lunesta while she is on trazodone.  Advised she will need to follow-up every 3 months me to prescribe Lunesta.  Counseled on avoiding alcohol on lunesta and trazodone.  moving will complete controlled substance contract at follow-up  if she continues lunesta. continue Cymbalta 60 mg daily.    Orders: -     Ambulatory referral to Psychology -     traZODone HCl; Take 1 tablet (50 mg total) by mouth at bedtime. Sleep, depression  Dispense: 90 tablet; Refill: 3  Fibromyalgia  Other chronic pain -     Cyclobenzaprine HCl; Take 1 tablet (5 mg total) by mouth 3 (three) times daily as needed for muscle spasms.  Dispense: 90 tablet; Refill: 5  Discoid lupus  Depression, recurrent (HCC) -     traZODone HCl; Take 1 tablet (50 mg total) by mouth at bedtime. Sleep, depression  Dispense: 90 tablet; Refill: 3  Chronic insomnia -     traZODone HCl; Take 1 tablet (50 mg total) by mouth at bedtime. Sleep, depression  Dispense: 90 tablet; Refill: 3     Return precautions given.   Risks, benefits, and alternatives of the medications and treatment plan prescribed today were discussed, and patient expressed understanding.   Education regarding symptom management and diagnosis given to patient on AVS either electronically or printed.  Return in about 3 months (around 04/18/2022) for Complete Physical Exam.  Mable Paris, FNP  Subjective:    Patient ID: Helen Freeman, female    DOB:  July 13, 1960, 61 y.o.   MRN: [id number]  CC: Helen Freeman is a 61 y.o. female who presents today for follow up.   HPI: Insomnia - She is not sleeping well. takes trazodone or lunesta prn. She doesn't drink alcohol. Compliant with cymbalta '60mg'$  qd. She is interested in counseling Denies SI/hi. She has a strong faith.     Discoid lupus, Dr Sharol Roussel, dermatology at the [facility] whom prescribes methotrexate  Follows with Pain management in [place]. Dr Do-Ouro whom prescribes norco, morphine, tizanidine.   She will flexeril '5mg'$  TID prn.  She does not take Flexeril and tizanidine at the same.  She takes for back pain and fibromyalgia.    Allergies: Plaquenil  [hydroxychloroquine sulfate], Lactose intolerance (gi), Other, and Plaquenil [hydroxychloroquine] Current Outpatient Medications on File Prior to Visit  Medication Sig Dispense Refill   atorvastatin (LIPITOR) 20 MG tablet Take 1 tablet (20 mg total) by mouth daily. 90 tablet 3   Carboxymethylcellulose Sodium 0.25 % SOLN Administer 2 drops to both eyes daily as needed.     cycloSPORINE (RESTASIS) 0.05 % ophthalmic emulsion Administer 2 drops to both eyes every morning.     DULoxetine (CYMBALTA) 60 MG capsule Take 1 capsule (60 mg total) by mouth daily.     Eszopiclone 3 MG TABS Take 1 tablet (3 mg total) by mouth at bedtime as needed. Take immediately before bedtime 90 tablet 1   fluticasone (FLONASE) 50 MCG/ACT nasal spray Place  2 sprays into both nostrils daily as needed for allergies or rhinitis. fluticasone propionate 50 mcg/actuation nasal spray,suspension 16 g 11   folic acid (FOLVITE) 1 MG tablet Take 1 mg by mouth daily.     gabapentin (NEURONTIN) 100 MG capsule Take 1 capsule (100 mg total) by mouth at bedtime. 90 capsule 3   HYDROcodone-acetaminophen (NORCO) 10-325 MG tablet Take 1 tablet by mouth 3 (three) times daily as needed.     hydrocortisone 2.5 % cream Apply topically 2 (two) times daily. Prn neck 60 g 0   ipratropium (ATROVENT)  0.06 % nasal spray Place 2 sprays into both nostrils 4 (four) times daily. 15 mL 13   levocetirizine (XYZAL) 5 MG tablet Take 1 tablet (5 mg total) by mouth at bedtime as needed for allergies. 90 tablet 3   levothyroxine (SYNTHROID) 150 MCG tablet Take 1 tablet (150 mcg total) by mouth daily. 90 tablet 1   Methotrexate 2.5 MG/ML SOLN 15 mg once a week.     morphine (MSIR) 15 MG tablet Take 15 mg by mouth every 4 (four) hours as needed.      rizatriptan (MAXALT-MLT) 10 MG disintegrating tablet Take 1 tablet (10 mg total) by mouth as needed for migraine. May repeat in 2 hours if needed. No more than 2x per week. No more than 30 mg in 24 hrs 10 tablet 11   sodium chloride (OCEAN) 0.65 % SOLN nasal spray Place 2 sprays into both nostrils as needed for congestion. 30 mL 12   topiramate (TOPAMAX) 25 MG tablet Take 1 tablet (25 mg total) by mouth 2 (two) times daily. 180 tablet 3   No current facility-administered medications on file prior to visit.    Review of Systems  Constitutional:  Negative for chills and fever.  Respiratory:  Negative for cough.   Cardiovascular:  Negative for chest pain and palpitations.  Gastrointestinal:  Negative for nausea and vomiting.  Musculoskeletal:  Positive for back pain.  Psychiatric/Behavioral:  Positive for sleep disturbance. Negative for suicidal ideas. The patient is not nervous/anxious.       Objective:    BP 130/72   Pulse 72   Temp 98.2 F (36.8 C) (Oral)   Ht '5\' 2"'$  (1.575 m)   Wt 203 lb 3.2 oz (92.2 kg)   SpO2 96%   BMI 37.17 kg/m  BP Readings from Last 3 Encounters:  01/17/22 130/72  10/11/21 124/70  04/24/21 132/89   Wt Readings from Last 3 Encounters:  01/17/22 203 lb 3.2 oz (92.2 kg)  10/11/21 204 lb 12.8 oz (92.9 kg)  04/24/21 201 lb (91.2 kg)    Physical Exam Vitals reviewed.  Constitutional:      Appearance: She is well-developed.  Eyes:     Conjunctiva/sclera: Conjunctivae normal.  Cardiovascular:     Rate and Rhythm:  Normal rate and regular rhythm.     Pulses: Normal pulses.     Heart sounds: Normal heart sounds.  Pulmonary:     Effort: Pulmonary effort is normal.     Breath sounds: Normal breath sounds. No wheezing, rhonchi or rales.  Skin:    General: Skin is warm and dry.  Neurological:     Mental Status: She is alert.  Psychiatric:        Speech: Speech normal.        Behavior: Behavior normal.        Thought Content: Thought content normal.

## 2022-02-28 ENCOUNTER — Other Ambulatory Visit: Payer: Self-pay

## 2022-02-28 ENCOUNTER — Ambulatory Visit (INDEPENDENT_AMBULATORY_CARE_PROVIDER_SITE_OTHER): Payer: 59 | Admitting: Internal Medicine

## 2022-02-28 ENCOUNTER — Encounter: Payer: Self-pay | Admitting: Internal Medicine

## 2022-02-28 ENCOUNTER — Ambulatory Visit (INDEPENDENT_AMBULATORY_CARE_PROVIDER_SITE_OTHER): Payer: 59 | Admitting: Clinical

## 2022-02-28 VITALS — BP 130/80 | HR 71 | Temp 98.5°F | Resp 12 | Ht 60.63 in | Wt 202.0 lb

## 2022-02-28 DIAGNOSIS — K9049 Malabsorption due to intolerance, not elsewhere classified: Secondary | ICD-10-CM | POA: Diagnosis not present

## 2022-02-28 DIAGNOSIS — F331 Major depressive disorder, recurrent, moderate: Secondary | ICD-10-CM

## 2022-02-28 DIAGNOSIS — J3089 Other allergic rhinitis: Secondary | ICD-10-CM

## 2022-02-28 DIAGNOSIS — F411 Generalized anxiety disorder: Secondary | ICD-10-CM

## 2022-02-28 DIAGNOSIS — J310 Chronic rhinitis: Secondary | ICD-10-CM

## 2022-02-28 MED ORDER — FLUTICASONE PROPIONATE 50 MCG/ACT NA SUSP: 2.0000 | Freq: Every day | NASAL | 5 refills | Status: DC

## 2022-02-28 MED ORDER — LEVOCETIRIZINE DIHYDROCHLORIDE 5 MG PO TABS
5.0000 mg | ORAL_TABLET | Freq: Every evening | ORAL | 3 refills | Status: DC | PRN
Start: 2022-02-28 — End: 2022-06-06

## 2022-02-28 MED ORDER — AZELASTINE HCL 0.1 % NA SOLN: 1.0000 | Freq: Two times a day (BID) | NASAL | 5 refills | Status: DC

## 2022-02-28 NOTE — Patient Instructions (Addendum)
Allergic Rhinitis: - Positive skin test 02/2022: mold - Avoidance measures discussed. - Use nasal saline rinses before nose sprays such as with Neilmed Sinus Rinse.  Use distilled water.   - Use Flonase 2 sprays each nostril daily. Aim upward and outward. - Use Azelastine 1-2 sprays each nostril twice daily as needed. Aim upward and outward. - Use Xyzal '5mg'$  daily as needed for runny nose, itchy watery eyes, sneezing.   Food Intolerance/GERD - There is no testing for food insensitivity.  This is not an IgE mediated food allergy so skin testing to foods is not indicated. - Can consider cutting out wheat for a couple of weeks to see if that is cause of her symptoms.   ALLERGEN AVOIDANCE MEASURES  Molds - Indoor avoidance Use air conditioning to reduce indoor humidity.  Do not use a humidifier. Keep indoor humidity at 30 - 40%.  Use a dehumidifier if needed. In the bathroom use an exhaust fan or open a window after showering.  Wipe down damp surfaces after showering.  Clean bathrooms with a mold-killing solution (diluted bleach, or products like Tilex, etc) at least once a month. In the kitchen use an exhaust fan to remove steam from cooking.  Throw away spoiled foods immediately, and empty garbage daily.  Empty water pans below self-defrosting refrigerators frequently. Vent the clothes dryer to the outside. Limit indoor houseplants; mold grows in the dirt.  No houseplants in the bedroom. Remove carpet from the bedroom. Encase the mattress and box springs with a zippered encasing.  Molds - Outdoor avoidance Avoid being outside when the grass is being mowed, or the ground is tilled. Avoid playing in leaves, pine straw, hay, etc.  Dead plant materials contain mold. Avoid going into barns or grain storage areas. Remove leaves, clippings and compost from around the home.
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
## 2022-02-28 NOTE — Progress Notes (Signed)
                Katherina Right, LCSW

## 2022-02-28 NOTE — Progress Notes (Signed)
NEW PATIENT  Date of Service/Encounter:  02/28/22  Consult requested by: Burnard Hawthorne, FNP   Subjective:   Helen Freeman (DOB: 1960-02-24) is a 62 y.o. female who presents to the clinic on 02/28/2022 with a chief complaint of Allergy Testing and Establish Care (Always stuffy, lactose intolerance) .    History obtained from: chart review and patient.    Rhinitis:  Started around 89 when she first moved from [place] to [place]. She was in the [employer] so has lived in multiple places.  Symptoms include: nasal congestion, rhinorrhea, and post nasal drainage  She does have trouble with dry eyes.  Occurs year-round Potential triggers: not sure Treatments tried:  Xyzal; it helps somewhat and last use was last month.  Saline spray Flonase   Previous allergy testing: no History of sinus surgery: no Nonallergic triggers: none    Food Intolerance/GERD She had gastric bypass s/p gastric sleeve and also s/p hiatal hernia and has had trouble with reflux afterward the bypass.  Takes tums which helps but does not control it completely.  Previously was on pantoprazole and omeprazole but not clear why she stopped it.  She also wonders if she has a wheat allergy because she gets stomach pain.    Past Medical History: Past Medical History:  Diagnosis Date   Anxiety    Arthritis    Chronic gastritis    egd 04/18/16 + chronic gastritis and EGD 06/16/16 neg H pylori    Depression    Discoid lupus    Eczema    Fibroids    Fibromyalgia    Gallstones    11/25/17 [place]   Hashimoto's disease    Hyperlipemia    Hypothyroidism    Hypothyroidism    Kidney stone    left [facility] 2017 CT scan    Lateral epicondylitis    Lupus ([place])    Morphea    Morphea    Plantar fasciitis    PLE (polymorphic light eruption)    Prediabetes    Sleep apnea    Urine incontinence    Past Surgical History: Past Surgical History:  Procedure Laterality Date   ABDOMINAL HYSTERECTOMY     uterus and both  ovaries out per pt cervix intact h/o PID in 1986, scar tissue, hemorrhagic cysts; multiple pelvic surgeries 1986 to 2006    APPENDECTOMY     cervical radiofrequency     in [place]    ESOPHAGOGASTRODUODENOSCOPY     02/27/20 s/p sleeve gastrectomy, diaphragmatic hiatal hernia 3 cm nl esophagus and duodenum 2nd part   EXTRACORPOREAL SHOCK WAVE LITHOTRIPSY Left 12/20/2015   Procedure: EXTRACORPOREAL SHOCK WAVE LITHOTRIPSY (ESWL);  Surgeon: Nickie Retort, MD;  Location: [facility] ORS;  Service: Urology;  Laterality: Left;   GALLBLADDER SURGERY     02/2018 Dr. Darnell Level   HIATAL HERNIA REPAIR     05/2019 Dr. Kreg Shropshire with complications hematoma   LAPAROSCOPIC GASTRIC SLEEVE RESECTION     06/2016 Dr. Darnell Level    LAPAROSCOPIC TOTAL HYSTERECTOMY  2004   OTHER SURGICAL HISTORY     ROTATOR CUFF REPAIR Right    02/2004    Family History: Family History  Problem Relation Age of Onset   Allergic rhinitis Mother    Arthritis Mother    Hyperlipidemia Mother    Hypertension Mother    Heart disease Father    Early death Father    Asthma Maternal Aunt    Kidney cancer Maternal Grandmother    Hypertension Maternal Grandmother  Asthma Son    Prostate cancer Neg Hx     Social History:  Lives in a 9 year house Flooring in bedroom: carpet Pets: dog Tobacco use/exposure: none Job: mammogram tech  Medication List:  Allergies as of 02/28/2022       Reactions   Plaquenil  [hydroxychloroquine Sulfate] Rash   Lactose Intolerance (gi) Diarrhea   Other Rash   Sunlight  Polymorphous light eruption    Plaquenil [hydroxychloroquine] Rash        Medication List        Accurate as of February 28, 2022  4:34 PM. If you have any questions, ask your nurse or doctor.          atorvastatin 20 MG tablet Commonly known as: LIPITOR Take 1 tablet (20 mg total) by mouth daily.   azelastine 0.1 % nasal spray Commonly known as: ASTELIN Place 1 spray into both nostrils 2 (two) times daily. Use in each  nostril as directed Started by: Larose Kells, MD   Carboxymethylcellulose Sodium 0.25 % Soln Administer 2 drops to both eyes daily as needed.   cyclobenzaprine 5 MG tablet Commonly known as: FLEXERIL Take 1 tablet (5 mg total) by mouth 3 (three) times daily as needed for muscle spasms.   cycloSPORINE 0.05 % ophthalmic emulsion Commonly known as: RESTASIS Administer 2 drops to both eyes every morning.   DULoxetine 60 MG capsule Commonly known as: Cymbalta Take 1 capsule (60 mg total) by mouth daily.   Eszopiclone 3 MG Tabs Take 1 tablet (3 mg total) by mouth at bedtime as needed. Take immediately before bedtime   fluticasone 50 MCG/ACT nasal spray Commonly known as: FLONASE Place 2 sprays into both nostrils daily as needed for allergies or rhinitis. fluticasone propionate 50 mcg/actuation nasal spray,suspension What changed: Another medication with the same name was added. Make sure you understand how and when to take each. Changed by: Larose Kells, MD   fluticasone 50 MCG/ACT nasal spray Commonly known as: FLONASE Place 2 sprays into both nostrils daily. What changed: You were already taking a medication with the same name, and this prescription was added. Make sure you understand how and when to take each. Changed by: Larose Kells, MD   folic acid 1 MG tablet Commonly known as: FOLVITE Take 1 mg by mouth daily.   gabapentin 100 MG capsule Commonly known as: NEURONTIN Take 1 capsule (100 mg total) by mouth at bedtime.   HYDROcodone-acetaminophen 10-325 MG tablet Commonly known as: NORCO Take 1 tablet by mouth 3 (three) times daily as needed.   hydrocortisone 2.5 % cream Apply topically 2 (two) times daily. Prn neck   ipratropium 0.06 % nasal spray Commonly known as: Atrovent Place 2 sprays into both nostrils 4 (four) times daily.   levocetirizine 5 MG tablet Commonly known as: Xyzal Take 1 tablet (5 mg total) by mouth at bedtime as needed for allergies.    levothyroxine 150 MCG tablet Commonly known as: SYNTHROID Take 1 tablet (150 mcg total) by mouth daily.   Methotrexate 2.5 MG/ML Soln 15 mg once a week.   morphine 15 MG tablet Commonly known as: MSIR Take 15 mg by mouth every 4 (four) hours as needed.   rizatriptan 10 MG disintegrating tablet Commonly known as: MAXALT-MLT Take 1 tablet (10 mg total) by mouth as needed for migraine. May repeat in 2 hours if needed. No more than 2x per week. No more than 30 mg in 24 hrs   sodium  chloride 0.65 % Soln nasal spray Commonly known as: OCEAN Place 2 sprays into both nostrils as needed for congestion.   topiramate 25 MG tablet Commonly known as: TOPAMAX Take 1 tablet (25 mg total) by mouth 2 (two) times daily.   traZODone 50 MG tablet Commonly known as: DESYREL Take 1 tablet (50 mg total) by mouth at bedtime. Sleep, depression         REVIEW OF SYSTEMS: Pertinent positives and negatives discussed in HPI.   Objective:   Physical Exam: BP 130/80   Pulse 71   Temp 98.5 F (36.9 C) (Temporal)   Resp 12   Ht 5' 0.63" (1.54 m)   Wt 202 lb (91.6 kg)   SpO2 97%   BMI 38.63 kg/m  Body mass index is 38.63 kg/m. GEN: alert, well developed HEENT: clear conjunctiva, TM grey and translucent, nose with + inferior turbinate hypertrophy, pale nasal mucosa, slight clear rhinorrhea, + cobblestoning HEART: regular rate and rhythm, no murmur LUNGS: clear to auscultation bilaterally, no coughing, unlabored respiration ABDOMEN: soft, non distended  SKIN: no rashes or lesions  Reviewed:  10/11/2021: saw Dr. Aundra Dubin for uncontrolled rhinitis.  Started on saline spray, Xyzal and Ipratroprium spray PRN.  Referred to Allergy.   12/09/2021: seen by urgent care [facility] for acute bronchitis/cough- had dry cough for 3 weeks , started on azithromycin and tessalon perles.   10/28/2021: seen by RT Jalbert for OSA with 82% CPAP compliance.   01/13/2022: seen by Dermatology Dr Sharol Roussel for discoid  lupus/PMLE, morphea and eczema.  On hydrocortisone, triamcinolone, MTX, gabapentin.    Skin Testing:  Skin prick testing was placed, which includes aeroallergens/foods, histamine control, and saline control.  Verbal consent was obtained prior to placing test.  Patient tolerated procedure well.  Allergy testing results were read and interpreted by myself, documented by clinical staff. Adequate positive and negative control.  Results discussed with patient/family.  Airborne Adult Perc - 02/28/22 1402     Time Antigen Placed 1407    Allergen Manufacturer Lavella Hammock    Location Back    Number of Test 59             Intradermal - 02/28/22 1458     Time Antigen Placed 1503    Allergen Manufacturer Lavella Hammock    Location Arm    Number of Test 15               Assessment:   1. Chronic rhinitis   2. Food intolerance     Plan/Recommendations:  Allergic Rhinitis: - Positive skin test 02/2022: mold  - Avoidance measures discussed. - Use nasal saline rinses before nose sprays such as with Neilmed Sinus Rinse.  Use distilled water.   - Use Flonase 2 sprays each nostril daily. Aim upward and outward. - Use Azelastine 1-2 sprays each nostril twice daily as needed. Aim upward and outward. - Use Xyzal '5mg'$  daily as needed for runny nose, itchy watery eyes, sneezing.   Food Intolerance/GERD - There is no testing for food insensitivity.  This is not an IgE mediated food allergy so skin testing to foods is not indicated. - Can consider cutting out wheat for a couple of weeks to see if that is cause of her symptoms.   Return in about 6 weeks (around 04/11/2022).  Harlon Flor, MD [facility]

## 2022-03-14 ENCOUNTER — Ambulatory Visit: Admitting: Internal Medicine

## 2022-03-21 ENCOUNTER — Ambulatory Visit (INDEPENDENT_AMBULATORY_CARE_PROVIDER_SITE_OTHER): Payer: 59 | Admitting: Clinical

## 2022-03-21 DIAGNOSIS — F411 Generalized anxiety disorder: Secondary | ICD-10-CM

## 2022-03-21 DIAGNOSIS — F331 Major depressive disorder, recurrent, moderate: Secondary | ICD-10-CM

## 2022-03-21 LAB — HM MAMMOGRAPHY

## 2022-03-21 NOTE — Progress Notes (Signed)
[facility] Counselor/Therapist Progress Note  Patient ID: Helen Freeman, MRN: [id number]    Date: 03/21/22  Time Spent: 9:32  am - 10:13 am : 41 Minutes  Treatment Type: Individual Therapy.  Reported Symptoms: Patient reported "grumpy" mood and is "short tempered".  Mental Status Exam: Appearance:  Well Groomed     Behavior: Appropriate  Motor: Normal  Speech/Language:  Clear and Coherent  Affect: Appropriate  Mood: Patient reported "grumpy" mood  Thought process: normal  Thought content:   WNL  Sensory/Perceptual disturbances:   WNL  Orientation: oriented to person, place, and situation  Attention: Good  Concentration: Good  Memory: WNL  Fund of knowledge:  Good  Insight:   Good  Judgment:  Good  Impulse Control: Good   Risk Assessment: Danger to Self:  No Patient denied current suicidal ideation Self-injurious Behavior: No Danger to Others: No Patient denied current homicidal ideation Duty to Warn:no Physical Aggression / Violence:No  Access to Firearms a concern: No  Gang Involvement:No   Subjective:  Patient stated, "things have gotten a little more hectic" since last session.  Patient reported feeling "stressed" since last session. Patient reported "grumpy" mood and is "short tempered". Patient stated, "they make sense" in response to diagnoses. Patient stated, "I think it's a good idea, I'm ok with that" in response to treatment recommendations. Patient stated, "I would like to not always get so emotional about stuff", "I don't want to be mad all the time", and reported she would like to be able to be with family and have fun. Patient stated, "be a little happier" in response to long term goal for therapy.   Interventions: Motivational Interviewing Clinician conducted session via WebEx video from clinician's home office. Patient provided verbal consent to proceed with telehealth session and participated in session from patient's office at work.  Reviewed events since last session. Clinician reviewed diagnoses and treatment recommendations. Provided psycho education related to diagnoses and treatment. Clinician utilized motivational interviewing to explore potential goals for therapy. Clinician utilized a task centered approach in collaboration with patient to begin to develop goals for therapy.   Diagnosis:  Major depressive disorder, recurrent episode, moderate (HCC)  Generalized anxiety disorder   Plan: Patient is to utilize [facility], thought re-framing, relaxation techniques, mindfulness and coping strategies to decrease symptoms associated with Major Depressive Disorder and Generalized Anxiety Disorder. Frequency: bi-weekly  Modality: individual     Long-term goal:   Reduce overall level, frequency, and intensity of the feelings of depression and anxiety as evidenced by decrease in loss of interest, fatigue, decline in energy, decline in appetite, difficulty staying asleep, "grumpy" mood, easily irritated, depressed mood, "a wave of dread", feeling "like something bad is going to happen", worry, and feeling jittery from 7 days/week to 0 to 1 days/week per patient report for at least 3 consecutive months. Target Date: 03/22/23  Progress: in progress   Short-term goal:  To be developed during follow up appointment on 04/14/22.                      Katherina Right, LCSW

## 2022-04-11 ENCOUNTER — Ambulatory Visit: Admitting: Internal Medicine

## 2022-04-14 ENCOUNTER — Ambulatory Visit (INDEPENDENT_AMBULATORY_CARE_PROVIDER_SITE_OTHER): Payer: 59 | Admitting: Clinical

## 2022-04-14 DIAGNOSIS — F411 Generalized anxiety disorder: Secondary | ICD-10-CM | POA: Diagnosis not present

## 2022-04-14 DIAGNOSIS — F331 Major depressive disorder, recurrent, moderate: Secondary | ICD-10-CM

## 2022-04-14 NOTE — Progress Notes (Signed)
[facility] Counselor/Therapist Progress Note  Patient ID: Helen Freeman, MRN: [id number],    Date: 04/14/2022  Time Spent: 12:32pm - 1:25pm : 53 minutes  Treatment Type: Individual Therapy  Reported Symptoms: Patient reported feeling tired.   Mental Status Exam: Appearance:  Well Groomed     Behavior: Appropriate  Motor: Normal  Speech/Language:  Clear and Coherent  Affect: Appropriate  Mood: Patient reported feeling tired today  Thought process: normal  Thought content:   WNL  Sensory/Perceptual disturbances:   WNL  Orientation: oriented to person, place, and situation  Attention: Good  Concentration: Good  Memory: WNL  Fund of knowledge:  Good  Insight:   Good  Judgment:  Good  Impulse Control: Good   Risk Assessment: Danger to Self:  No Patient denied current suicidal ideation  Self-injurious Behavior: No Danger to Others: No Patient denied current homicidal ideation  Duty to Warn:no Physical Aggression / Violence:No  Access to Firearms a concern: No  Gang Involvement:No   Subjective: Patient reported no changes since last session. Patient stated, "I've been a little stressed out" and reported increased stress due to exploring other job opportunities.  Patient reported she is uncertain about making a change in employment. Patient reported her experience at a previous employer has impacted patient's self confidence in pursuing other job opportunities. Patient reported challenges in her current role. 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 Patient reported difficulty staying asleep last night. Patient reported she was able to enjoy some time with family recently. Patient reported improvement in mood while several family members were not present in the home for a week.  Patient reported feeling tired today. Patient reported she  left the goals she wrote down in her notebook at home and requested to discuss goals next session.   Interventions: Cognitive Behavioral Therapy and Motivational Interviewing. Clinician conducted session via WebEx video from clinician's home office. Patient provided verbal consent to proceed with telehealth session and participated in session from patient's place of employment. Reviewed events since last session. Discussed current stressors, recent changes in family dynamics,  and the impact on patient's mood. Explored the pros/cons for job opportunities patient is interested in and discussed which job opportunity would be most conducive to and supportive of patient's mental health. Clinician utilized motivational interviewing to explore potential goals for therapy.   Diagnosis:  Major depressive disorder, recurrent episode, moderate (HCC)   Generalized anxiety disorder     Plan: Patient is to utilize [employer] [facility], thought re-framing, relaxation techniques, mindfulness and coping strategies to decrease symptoms associated with Major Depressive Disorder and Generalized Anxiety Disorder. Frequency: bi-weekly  Modality: individual      Long-term goal:   Reduce overall level, frequency, and intensity of the feelings of depression and anxiety as evidenced by decrease in loss of interest, fatigue, decline in energy, decline in appetite, difficulty staying asleep, "grumpy" mood, easily irritated, depressed mood, "a wave of dread", feeling "like something bad is going to happen", worry, and feeling jittery from 7 days/week to 0 to 1 days/week per patient report for at least 3 consecutive months. Target Date: 03/22/23  Progress: in progress    Short-term goal:  To be developed during follow up appointment on 04/25/22.  Katherina Right, LCSW

## 2022-04-14 NOTE — Progress Notes (Signed)
                Lowen Barringer, LCSW 

## 2022-04-18 ENCOUNTER — Encounter: Admitting: Family

## 2022-04-25 ENCOUNTER — Ambulatory Visit (INDEPENDENT_AMBULATORY_CARE_PROVIDER_SITE_OTHER): Payer: 59 | Admitting: Clinical

## 2022-04-25 ENCOUNTER — Ambulatory Visit: Admitting: Internal Medicine

## 2022-04-25 DIAGNOSIS — F331 Major depressive disorder, recurrent, moderate: Secondary | ICD-10-CM | POA: Diagnosis not present

## 2022-04-25 DIAGNOSIS — F411 Generalized anxiety disorder: Secondary | ICD-10-CM

## 2022-04-25 NOTE — Progress Notes (Signed)
[facility] Counselor/Therapist Progress Note  Patient ID: Helen Freeman, MRN: [id number],    Date: 04/25/2022  Time Spent: 10:36am - 11:15am : 39 minutes  Treatment Type: Individual Therapy  Reported Symptoms: Patient reported feeling "tired and grumpy" today.   Mental Status Exam: Appearance:  Well Groomed     Behavior: Appropriate  Motor: Normal  Speech/Language:  Clear and Coherent  Affect: Appropriate  Mood: Patient stated, "grumpy" mood   Thought process: normal  Thought content:   WNL  Sensory/Perceptual disturbances:   WNL  Orientation: oriented to person, place, and situation  Attention: Good  Concentration: Good  Memory: WNL  Fund of knowledge:  Good  Insight:   Good  Judgment:  Good  Impulse Control: Good   Risk Assessment: Danger to Self:  No Patient denied current suicidal ideation  Self-injurious Behavior: No Danger to Others: No Patient denied current homicidal ideation  Duty to Warn:no Physical Aggression / Violence:No  Access to Firearms a concern: No  Gang Involvement:No   Subjective: Patient reported she recently went to [place] and was seen by her pain management provider.  Patient reported she is accustomed to living in pain and reported she doesn't feel it impacts her mood. Patient stated, "tired, grumpy" in response to current mood. Patient reported she woke up last night and was not able to fall back asleep. Patient reported she woke up to a "feeling of dread". Patient reported she utilized deep breathing in response to try to return to sleep. Patient reported she has been thinking about goals for therapy but reported difficulty identifying goals. Patient reported she "shuts down" when angry. Patient agreed to goals/treatment plan developed during session.   Interventions: Motivational Interviewing. Clinician conducted session via WebEx video from clinician's home office. Patient provided verbal consent to proceed with telehealth  session and participated in session from a confidential space at patient's place of employment. Reviewed events since last session. Clinician utilized motivational interviewing to explore potential goals for therapy. Provided psycho education related to treatment and goals for therapy. Clinician utilized a task centered approach in collaboration with patient to finalize goals for therapy.     Diagnosis:  Major depressive disorder, recurrent episode, moderate (HCC)   Generalized anxiety disorder     Plan: Patient is to utilize [employer] [facility], thought re-framing, relaxation techniques, mindfulness and coping strategies to decrease symptoms associated with Major Depressive Disorder and Generalized Anxiety Disorder. Frequency: bi-weekly  Modality: individual      Long-term goal:   Reduce overall level, frequency, and intensity of the feelings of depression and anxiety as evidenced by decrease in loss of interest, fatigue, decline in energy, decline in appetite, difficulty staying asleep, "grumpy" mood, easily irritated, depressed mood, "a wave of dread", feeling "like something bad is going to happen", worry, and feeling jittery from 7 days/week to 0 to 1 days/week per patient report for at least 3 consecutive months. Target Date: 03/22/23  Progress: in progress    Short-term goal:  Identify triggers for anger/irritability and develop coping strategies to utilize in response to feelings of anger/irritability to reduce anger outbursts and "shutting down" per patient's report  Target Date: 10/26/22  Progress: in progress   Develop effective communication strategies for patient to utilize when expressing her thoughts and feelings to others in a controlled and assertive way  Target Date: 10/26/22  Progress: in progress   Identify, challenge, and replace negative thought patterns that contribute to feelings of depression and anxiety with positive thoughts  and beliefs per patient's  report Target Date: 10/26/22  Progress: in progress                          Katherina Right, LCSW

## 2022-04-25 NOTE — Progress Notes (Signed)
                Deforest Maiden, LCSW 

## 2022-05-09 ENCOUNTER — Ambulatory Visit (INDEPENDENT_AMBULATORY_CARE_PROVIDER_SITE_OTHER): Payer: 59 | Admitting: Clinical

## 2022-05-09 DIAGNOSIS — F411 Generalized anxiety disorder: Secondary | ICD-10-CM

## 2022-05-09 DIAGNOSIS — F331 Major depressive disorder, recurrent, moderate: Secondary | ICD-10-CM

## 2022-05-09 NOTE — Progress Notes (Signed)
                Gawain Crombie, LCSW 

## 2022-05-09 NOTE — Progress Notes (Signed)
[facility] Counselor/Therapist Progress Note  Patient ID: Helen Freeman, MRN: [id number],    Date: 05/09/2022  Time Spent: 1:31pm - 2:19pm : 48 minutes  Treatment Type: Individual Therapy  Reported Symptoms: Patient reported feeling "blah" as it relates to her mood.   Mental Status Exam: Appearance:  Well Groomed     Behavior: Appropriate  Motor: Normal  Speech/Language:  Clear and Coherent  Affect: Flat  Mood: depressed  Thought process: normal  Thought content:   WNL  Sensory/Perceptual disturbances:   WNL  Orientation: oriented to person, place, and situation  Attention: Good  Concentration: Good  Memory: WNL  Fund of knowledge:  Good  Insight:   Good  Judgment:  Good  Impulse Control: Good   Risk Assessment: Danger to Self:  No Patient denied current suicidal ideation  Self-injurious Behavior: No Danger to Others: No Patient denied current homicidal ideation  Duty to Warn:no Physical Aggression / Violence:No  Access to Firearms a concern: No  Gang Involvement:No   Subjective: Patient reported she had to have her car repaired and was without a car for 2 weeks. Patient stated, "I just haven't felt good this week" and reported feeling "blah". Patient reported she did not receive offers for the job opportunities she discussed with clinician during a previous session. Patient stated, "I was a little disappointed" and stated, "I was surprised" in response to the outcome of the job opportunities. Patient reported her 49 year old niece's behaviors are a trigger for changes in patient's mood. Patient reported she has been going to the gym and going for a walk with her dogs and children in response to feeling "blah". Patient reported journaling has been a challenge in the past but reported she will try to maintain gratitude journal. During session patient was able to identify one situation that made her laugh today.   Interventions: Cognitive Behavioral Therapy and  Dialectical Behavioral Therapy. Clinician conducted session via WebEx video from clinician's home office. Patient provided verbal consent to proceed with telehealth session and participated in session from patient's home. Reviewed events since last session and recent stressors. Explored triggers for depressed mood. Provided psycho education related to gratitude exercises, the use of a gratitude journal, and opposite action.  Provided active and reflective listening and validation. Clinician requested for homework patient start a gratitude journal and identify at least one positive aspect of each day. In addition, clinician request patient notate any difficulties in maintaining gratitude journal and any stressors at that time.      Diagnosis:  Major depressive disorder, recurrent episode, moderate (HCC)   Generalized anxiety disorder     Plan: Patient is to utilize [employer] [facility], thought re-framing, relaxation techniques, mindfulness and coping strategies to decrease symptoms associated with Major Depressive Disorder and Generalized Anxiety Disorder. Frequency: bi-weekly  Modality: individual      Long-term goal:   Reduce overall level, frequency, and intensity of the feelings of depression and anxiety as evidenced by decrease in loss of interest, fatigue, decline in energy, decline in appetite, difficulty staying asleep, "grumpy" mood, easily irritated, depressed mood, "a wave of dread", feeling "like something bad is going to happen", worry, and feeling jittery from 7 days/week to 0 to 1 days/week per patient report for at least 3 consecutive months. Target Date: 03/22/23  Progress: in progress    Short-term goal:  Identify triggers for anger/irritability and develop coping strategies to utilize in response to feelings of anger/irritability to reduce anger outbursts and "shutting down"  per patient's report  Target Date: 10/26/22  Progress: in progress    Develop effective  communication strategies for patient to utilize when expressing her thoughts and feelings to others in a controlled and assertive way  Target Date: 10/26/22  Progress: in progress    Identify, challenge, and replace negative thought patterns that contribute to feelings of depression and anxiety with positive thoughts and beliefs per patient's report Target Date: 10/26/22  Progress: in progress                           Doree BarthelKaren Wyman Meschke, LCSW

## 2022-05-15 ENCOUNTER — Other Ambulatory Visit: Payer: Self-pay | Admitting: Internal Medicine

## 2022-05-23 ENCOUNTER — Ambulatory Visit: Admitting: Clinical

## 2022-06-04 HISTORY — PX: OTHER SURGICAL HISTORY: SHX169

## 2022-06-06 ENCOUNTER — Encounter: Payer: Self-pay | Admitting: Family

## 2022-06-06 ENCOUNTER — Other Ambulatory Visit (HOSPITAL_COMMUNITY)
Admission: RE | Admit: 2022-06-06 | Discharge: 2022-06-06 | Disposition: A | Discharge status: Home / Self Care | Payer: TRICARE For Life (TFL) | Source: Ambulatory Visit | Attending: Family | Admitting: Family

## 2022-06-06 ENCOUNTER — Ambulatory Visit (INDEPENDENT_AMBULATORY_CARE_PROVIDER_SITE_OTHER): Payer: 59 | Admitting: Family

## 2022-06-06 VITALS — BP 128/76 | HR 77 | Temp 98.2°F | Ht 61.5 in | Wt 189.8 lb

## 2022-06-06 DIAGNOSIS — E669 Obesity, unspecified: Secondary | ICD-10-CM

## 2022-06-06 DIAGNOSIS — Z124 Encounter for screening for malignant neoplasm of cervix: Secondary | ICD-10-CM

## 2022-06-06 DIAGNOSIS — Z136 Encounter for screening for cardiovascular disorders: Secondary | ICD-10-CM

## 2022-06-06 DIAGNOSIS — Z122 Encounter for screening for malignant neoplasm of respiratory organs: Secondary | ICD-10-CM | POA: Diagnosis not present

## 2022-06-06 DIAGNOSIS — J309 Allergic rhinitis, unspecified: Secondary | ICD-10-CM

## 2022-06-06 DIAGNOSIS — Z0000 Encounter for general adult medical examination without abnormal findings: Secondary | ICD-10-CM

## 2022-06-06 DIAGNOSIS — E785 Hyperlipidemia, unspecified: Secondary | ICD-10-CM

## 2022-06-06 DIAGNOSIS — Z13220 Encounter for screening for lipoid disorders: Secondary | ICD-10-CM

## 2022-06-06 DIAGNOSIS — R7303 Prediabetes: Secondary | ICD-10-CM

## 2022-06-06 LAB — POCT GLYCOSYLATED HEMOGLOBIN (HGB A1C): HbA1c POC (<> result, manual entry): 6.1 % (ref 4.0–5.6)

## 2022-06-06 MED ORDER — METFORMIN HCL ER 500 MG PO TB24
500.0000 mg | ORAL_TABLET | Freq: Every evening | ORAL | 2 refills | Status: DC
Start: 2022-06-06 — End: 2022-07-11

## 2022-06-06 MED ORDER — CETIRIZINE HCL 10 MG PO TABS
10.0000 mg | ORAL_TABLET | Freq: Every day | ORAL | 1 refills | Status: DC
Start: 2022-06-06 — End: 2022-09-01

## 2022-06-06 NOTE — Patient Instructions (Addendum)
You are due for annual lung cancer screening program.    Previously the program had been managed under [facility] hematology/oncology, now it has been moved to [facility] pulmonology .    I have placed a referral to [facility] pulmonology  and their office will reach out to you to schedule your CT of your chest.  They will reach out to you annually going forward.  If you do not hear from their office in the next 1 to 2 weeks, please call [facility] at [phone]   So let me know if there are any issues in getting scheduled.   Metformin is used in prediabetes, diabetes, and also for weight loss by decreasing calorie consumption.   It works in a couple of ways by decreasing liver glucose production, decreases intestinal absorption of glucose and improves insulin sensitivity (increases peripheral glucose uptake and utilization).     Please make sure that you titrate per below so not to cause any GI upset.  The instructions on the metformin bottle however say metformin 1000mg  twice daily so that you do not run out of medication when you are on maximum dose but you will need to titrate to get there.    Lets trial metformin  Start metformin XR with one 500mg  tablet at night. After one week, you may increase to two tablets at night ( total of 1000mg ) . The third week, you may take take two tablets at night and one tablet in the morning.  The fourth week, you may take two tablets in the morning ( 1000mg  total) and two tablets at night (1000mg  total). This will bring you to a maximum daily dose of 2000mg /day which is maximum dose.  So you are aware,  you may take ALL 4 tablets of metformin together at the same time if preferable and doesn't cause GI upset. You may take metformin 2000mg  ( four of the 500mg  tablets) together in the morning or at night if you prefer.   Along the way, if you want to increase more slowly, please do as this medication can cause GI discomfort and loose stools  which usually get better with time , however some patients find that they can only tolerate a certain dose and cannot increase to maximum dose.      Health Maintenance for Postmenopausal Women Menopause is a normal process in which your ability to get pregnant comes to an end. This process happens slowly over many months or years, usually between the ages of 7 and 65. Menopause is complete when you have missed your menstrual period for 12 months. It is important to talk with your health care provider about some of the most common conditions that affect women after menopause (postmenopausal women). These include heart disease, cancer, and bone loss (osteoporosis). Adopting a healthy lifestyle and getting preventive care can help to promote your health and wellness. The actions you take can also lower your chances of developing some of these common conditions. What are the signs and symptoms of menopause? During menopause, you may have the following symptoms: Hot flashes. These can be moderate or severe. Night sweats. Decrease in sex drive. Mood swings. Headaches. Tiredness (fatigue). Irritability. Memory problems. Problems falling asleep or staying asleep. Talk with your health care provider about treatment options for your symptoms. Do I need hormone replacement therapy? Hormone replacement therapy is effective in treating symptoms that are caused by menopause, such as hot flashes and night sweats. Hormone replacement carries certain  risks, especially as you become older. If you are thinking about using estrogen or estrogen with progestin, discuss the benefits and risks with your health care provider. How can I reduce my risk for heart disease and stroke? The risk of heart disease, heart attack, and stroke increases as you age. One of the causes may be a change in the body's hormones during menopause. This can affect how your body uses dietary fats, triglycerides, and cholesterol. Heart  attack and stroke are medical emergencies. There are many things that you can do to help prevent heart disease and stroke. Watch your blood pressure High blood pressure causes heart disease and increases the risk of stroke. This is more likely to develop in people who have high blood pressure readings or are overweight. Have your blood pressure checked: Every 3-5 years if you are 79-49 years of age. Every year if you are 70 years old or older. Eat a healthy diet  Eat a diet that includes plenty of vegetables, fruits, low-fat dairy products, and lean protein. Do not eat a lot of foods that are high in solid fats, added sugars, or sodium. Get regular exercise Get regular exercise. This is one of the most important things you can do for your health. Most adults should: Try to exercise for at least 150 minutes each week. The exercise should increase your heart rate and make you sweat (moderate-intensity exercise). Try to do strengthening exercises at least twice each week. Do these in addition to the moderate-intensity exercise. Spend less time sitting. Even light physical activity can be beneficial. Other tips Work with your health care provider to achieve or maintain a healthy weight. Do not use any products that contain nicotine or tobacco. These products include cigarettes, chewing tobacco, and vaping devices, such as e-cigarettes. If you need help quitting, ask your health care provider. Know your numbers. Ask your health care provider to check your cholesterol and your blood sugar (glucose). Continue to have your blood tested as directed by your health care provider. Do I need screening for cancer? Depending on your health history and family history, you may need to have cancer screenings at different stages of your life. This may include screening for: Breast cancer. Cervical cancer. Lung cancer. Colorectal cancer. What is my risk for osteoporosis? After menopause, you may be at  increased risk for osteoporosis. Osteoporosis is a condition in which bone destruction happens more quickly than new bone creation. To help prevent osteoporosis or the bone fractures that can happen because of osteoporosis, you may take the following actions: If you are 49-41 years old, get at least 1,000 mg of calcium and at least 600 international units (IU) of vitamin D per day. If you are older than age 63 but younger than age 34, get at least 1,200 mg of calcium and at least 600 international units (IU) of vitamin D per day. If you are older than age 61, get at least 1,200 mg of calcium and at least 800 international units (IU) of vitamin D per day. Smoking and drinking excessive alcohol increase the risk of osteoporosis. Eat foods that are rich in calcium and vitamin D, and do weight-bearing exercises several times each week as directed by your health care provider. How does menopause affect my mental health? Depression may occur at any age, but it is more common as you become older. Common symptoms of depression include: Feeling depressed. Changes in sleep patterns. Changes in appetite or eating patterns. Feeling an overall lack of  motivation or enjoyment of activities that you previously enjoyed. Frequent crying spells. Talk with your health care provider if you think that you are experiencing any of these symptoms. General instructions See your health care provider for regular wellness exams and vaccines. This may include: Scheduling regular health, dental, and eye exams. Getting and maintaining your vaccines. These include: Influenza vaccine. Get this vaccine each year before the flu season begins. Pneumonia vaccine. Shingles vaccine. Tetanus, diphtheria, and pertussis (Tdap) booster vaccine. Your health care provider may also recommend other immunizations. Tell your health care provider if you have ever been abused or do not feel safe at home. Summary Menopause is a normal process  in which your ability to get pregnant comes to an end. This condition causes hot flashes, night sweats, decreased interest in sex, mood swings, headaches, or lack of sleep. Treatment for this condition may include hormone replacement therapy. Take actions to keep yourself healthy, including exercising regularly, eating a healthy diet, watching your weight, and checking your blood pressure and blood sugar levels. Get screened for cancer and depression. Make sure that you are up to date with all your vaccines. This information is not intended to replace advice given to you by your health care provider. Make sure you discuss any questions you have with your health care provider. Document Revised: 06/11/2020 Document Reviewed: 06/11/2020 Elsevier Patient Education  2023 [employer].

## 2022-06-06 NOTE — Progress Notes (Unsigned)
Assessment & Plan:  Screening for lung cancer -     Ambulatory Referral for Lung Cancer Scre  Screening for cervical cancer -     Cytology - PAP  Obesity (BMI 30-39.9) -     metFORMIN HCl ER; Take 1 tablet (500 mg total) by mouth every evening.  Dispense: 90 tablet; Refill: 2  Allergic rhinitis, unspecified seasonality, unspecified trigger -     Cetirizine HCl; Take 1 tablet (10 mg total) by mouth daily.  Dispense: 90 tablet; Refill: 1  Encounter for lipid screening for cardiovascular disease -     Lipid panel; Future  Hyperlipidemia, unspecified hyperlipidemia type -     POCT glycosylated hemoglobin (Hb A1C)  Prediabetes Assessment & Plan: Lab Results  Component Value Date   HGBA1C 6.1 06/06/2022   History of gastric sleeve, prediabetes.  Start metformin and titrate.  Counseled on side effects   Annual physical exam Assessment & Plan: Clinical breast exam performed today.  Pap smear obtained.  Referral for lung cancer screening program.      Return precautions given.   Risks, benefits, and alternatives of the medications and treatment plan prescribed today were discussed, and patient expressed understanding.   Education regarding symptom management and diagnosis given to patient on AVS either electronically or printed.  Return in about 4 months (around 10/07/2022) for Fasting labs in 2-3 weeks.  Rennie Plowman, FNP  Subjective:    Patient ID: Akeela Thalman, female    DOB: 01/31/1961, 62 y.o.   MRN: [id number]  CC: Amairah Koellner is a 62 y.o. female who presents today for physical exam.    HPI: Feels well today.   She is frustrated by weight.  History of gastric sleeve, prediabetes.     No new complaints    Colorectal Cancer Screening: UTD ; Thrall Coram 02/27/2020, repeat colonoscopy.  She states she is on a 3-year repeat. Breast Cancer Screening: Mammogram UTD Cervical Cancer Screening: due .  No record of Pap smear in chart.   History of  hysterectomy.  Uterus and both ovaries out per pt cervix intact h/o PID in 1986, scar tissue, hemorrhagic cysts; multiple pelvic surgeries 1986 to 2006   Bone Health screening/DEXA for 65+: No increased fracture risk. Defer screening at this time.  Lung Cancer Screening: meets criteria        Tetanus - UTD          Exercise: Gets regular exercise.   Alcohol use:  none Smoking/tobacco use: former smoker.    Health Maintenance  Topic Date Due   COVID-19 Vaccine (6 - 2023-24 season) 10/04/2021   Pap Smear  06/06/2023*   Flu Shot  09/04/2022   Colon Cancer Screening  02/27/2023   DTaP/Tdap/Td vaccine (6 - Td or Tdap) 04/19/2023   Mammogram  03/21/2024   Hepatitis C Screening: USPSTF Recommendation to screen - Ages 54-79 yo.  Completed   Zoster (Shingles) Vaccine  Completed   HPV Vaccine  Aged Out   HIV Screening  Discontinued  *Topic was postponed. The date shown is not the original due date.    ALLERGIES: Plaquenil  [hydroxychloroquine sulfate], Lactose intolerance (gi), Other, and Plaquenil [hydroxychloroquine]  Current Outpatient Medications on File Prior to Visit  Medication Sig Dispense Refill   atorvastatin (LIPITOR) 20 MG tablet Take 1 tablet (20 mg total) by mouth daily. 90 tablet 3   azelastine (ASTELIN) 0.1 % nasal spray Place 1 spray into both nostrils 2 (two) times daily. Use in each nostril as  directed 30 mL 5   Carboxymethylcellulose Sodium 0.25 % SOLN Administer 2 drops to both eyes daily as needed.     cyclobenzaprine (FLEXERIL) 5 MG tablet Take 1 tablet (5 mg total) by mouth 3 (three) times daily as needed for muscle spasms. 90 tablet 5   cycloSPORINE (RESTASIS) 0.05 % ophthalmic emulsion Administer 2 drops to both eyes every morning.     DULoxetine (CYMBALTA) 60 MG capsule Take 1 capsule (60 mg total) by mouth daily.     Eszopiclone 3 MG TABS Take 1 tablet (3 mg total) by mouth at bedtime as needed. Take immediately before bedtime 90 tablet 1   fluticasone  (FLONASE) 50 MCG/ACT nasal spray Place 2 sprays into both nostrils daily as needed for allergies or rhinitis. fluticasone propionate 50 mcg/actuation nasal spray,suspension 16 g 11   fluticasone (FLONASE) 50 MCG/ACT nasal spray Place 2 sprays into both nostrils daily. 16 g 5   folic acid (FOLVITE) 1 MG tablet Take 1 mg by mouth daily.     gabapentin (NEURONTIN) 100 MG capsule Take 1 capsule (100 mg total) by mouth at bedtime. 90 capsule 3   HYDROcodone-acetaminophen (NORCO) 10-325 MG tablet Take 1 tablet by mouth 3 (three) times daily as needed.     hydrocortisone 2.5 % cream Apply topically 2 (two) times daily. Prn neck 60 g 0   ipratropium (ATROVENT) 0.06 % nasal spray Place 2 sprays into both nostrils 4 (four) times daily. 15 mL 13   levothyroxine (SYNTHROID) 150 MCG tablet TAKE 1 TABLET BY MOUTH DAILY 90 tablet 1   Methotrexate 2.5 MG/ML SOLN 15 mg once a week.     morphine (MSIR) 15 MG tablet Take 15 mg by mouth every 4 (four) hours as needed.      rizatriptan (MAXALT-MLT) 10 MG disintegrating tablet Take 1 tablet (10 mg total) by mouth as needed for migraine. May repeat in 2 hours if needed. No more than 2x per week. No more than 30 mg in 24 hrs 10 tablet 11   sodium chloride (OCEAN) 0.65 % SOLN nasal spray Place 2 sprays into both nostrils as needed for congestion. 30 mL 12   topiramate (TOPAMAX) 25 MG tablet Take 1 tablet (25 mg total) by mouth 2 (two) times daily. 180 tablet 3   traZODone (DESYREL) 50 MG tablet Take 1 tablet (50 mg total) by mouth at bedtime. Sleep, depression 90 tablet 3   No current facility-administered medications on file prior to visit.    Review of Systems  Constitutional:  Negative for chills, fever and unexpected weight change.  HENT:  Negative for congestion.   Respiratory:  Negative for cough.   Cardiovascular:  Negative for chest pain, palpitations and leg swelling.  Gastrointestinal:  Negative for nausea and vomiting.  Musculoskeletal:  Negative for  arthralgias and myalgias.  Skin:  Negative for rash.  Neurological:  Negative for headaches.  Hematological:  Negative for adenopathy.  Psychiatric/Behavioral:  Negative for confusion.       Objective:    BP 128/76   Pulse 77   Temp 98.2 F (36.8 C) (Oral)   Ht 5' 1.5" (1.562 m)   Wt 189 lb 12.8 oz (86.1 kg)   SpO2 97%   BMI 35.28 kg/m   BP Readings from Last 3 Encounters:  06/06/22 128/76  02/28/22 130/80  01/17/22 130/72   Wt Readings from Last 3 Encounters:  06/06/22 189 lb 12.8 oz (86.1 kg)  02/28/22 202 lb (91.6 kg)  01/17/22 203 lb  3.2 oz (92.2 kg)    Physical Exam Vitals reviewed.  Constitutional:      Appearance: Normal appearance. She is well-developed.  Eyes:     Conjunctiva/sclera: Conjunctivae normal.  Neck:     Thyroid: No thyroid mass or thyromegaly.  Cardiovascular:     Rate and Rhythm: Normal rate and regular rhythm.     Pulses: Normal pulses.     Heart sounds: Normal heart sounds.  Pulmonary:     Effort: Pulmonary effort is normal.     Breath sounds: Normal breath sounds. No wheezing, rhonchi or rales.  Chest:  Breasts:    Breasts are symmetrical.     Right: No inverted nipple, mass, nipple discharge, skin change or tenderness.     Left: No inverted nipple, mass, nipple discharge, skin change or tenderness.  Abdominal:     General: Bowel sounds are normal. There is no distension.     Palpations: Abdomen is soft. Abdomen is not rigid. There is no fluid wave or mass.     Tenderness: There is no abdominal tenderness. There is no guarding or rebound.  Genitourinary:    Cervix: No cervical motion tenderness, discharge or friability.     Adnexa:        Right: No mass, tenderness or fullness.         Left: No mass, tenderness or fullness.       Comments: Pap performed. No CMT.  Lymphadenopathy:     Head:     Right side of head: No submental, submandibular, tonsillar, preauricular, posterior auricular or occipital adenopathy.     Left side of  head: No submental, submandibular, tonsillar, preauricular, posterior auricular or occipital adenopathy.     Cervical:     Right cervical: No superficial, deep or posterior cervical adenopathy.    Left cervical: No superficial, deep or posterior cervical adenopathy.     Upper Body:     Right upper body: No pectoral adenopathy.     Left upper body: No pectoral adenopathy.  Skin:    General: Skin is warm and dry.  Neurological:     Mental Status: She is alert.  Psychiatric:        Speech: Speech normal.        Behavior: Behavior normal.        Thought Content: Thought content normal.

## 2022-06-07 ENCOUNTER — Encounter: Payer: Self-pay | Admitting: Family

## 2022-06-07 NOTE — Assessment & Plan Note (Signed)
Lab Results  Component Value Date   HGBA1C 6.1 06/06/2022   History of gastric sleeve, prediabetes.  Start metformin and titrate.  Counseled on side effects

## 2022-06-07 NOTE — Assessment & Plan Note (Signed)
Clinical breast exam performed today.  Pap smear obtained.  Referral for lung cancer screening program.

## 2022-06-10 LAB — CYTOLOGY - PAP
Comment: NEGATIVE
Diagnosis: NEGATIVE
High risk HPV: NEGATIVE

## 2022-06-13 ENCOUNTER — Ambulatory Visit: Payer: 59 | Admitting: Clinical

## 2022-06-17 ENCOUNTER — Other Ambulatory Visit (INDEPENDENT_AMBULATORY_CARE_PROVIDER_SITE_OTHER): Payer: 59

## 2022-06-17 DIAGNOSIS — Z13220 Encounter for screening for lipoid disorders: Secondary | ICD-10-CM

## 2022-06-17 DIAGNOSIS — Z136 Encounter for screening for cardiovascular disorders: Secondary | ICD-10-CM | POA: Diagnosis not present

## 2022-06-17 LAB — LIPID PANEL
Cholesterol: 154 mg/dL (ref 0–200)
HDL: 54.9 mg/dL (ref >39.00)
LDL Cholesterol: 81 mg/dL (ref 0–99)
NonHDL: 99.58
Total CHOL/HDL Ratio: 3
Triglycerides: 94 mg/dL (ref 0.0–149.0)
VLDL: 18.8 mg/dL (ref 0.0–40.0)

## 2022-07-11 ENCOUNTER — Encounter: Payer: Self-pay | Admitting: Family

## 2022-07-11 DIAGNOSIS — E669 Obesity, unspecified: Secondary | ICD-10-CM

## 2022-07-11 MED ORDER — METFORMIN HCL ER 500 MG PO TB24: 2000.0000 mg | ORAL_TABLET | Freq: Every evening | ORAL | 0 refills | Status: DC

## 2022-07-11 NOTE — Telephone Encounter (Unsigned)
Sent to pharmacy 

## 2022-07-11 NOTE — Telephone Encounter (Signed)
Can you clarify with the patient that she is taking 2000 mg of metformin XR once daily now? I can not tell from her last note if that is the dose she was to be on. Thanks.

## 2022-07-11 NOTE — Telephone Encounter (Signed)
Patient called about her MyChart message; she is out of metFORMIN (GLUCOPHAGE-XR) 500 MG 24 hr tablet Patient is out early because [name] up the dose and only has 2 remaining.

## 2022-07-11 NOTE — Telephone Encounter (Signed)
Noted  

## 2022-07-16 ENCOUNTER — Other Ambulatory Visit: Payer: Self-pay | Admitting: Family

## 2022-08-05 ENCOUNTER — Other Ambulatory Visit: Payer: Self-pay | Admitting: Family Medicine

## 2022-08-05 DIAGNOSIS — E669 Obesity, unspecified: Secondary | ICD-10-CM

## 2022-08-08 ENCOUNTER — Telehealth: Payer: Self-pay

## 2022-08-08 NOTE — Telephone Encounter (Signed)
Called to get pt scheduled for her 4 month follow up with PCP and fasting labs per pcps last note due to a refill request for metformin refill has been sent in as pt has been scheduled for both labs and an appt.

## 2022-08-22 ENCOUNTER — Emergency Department (HOSPITAL_BASED_OUTPATIENT_CLINIC_OR_DEPARTMENT_OTHER): Payer: 59

## 2022-08-22 ENCOUNTER — Encounter (HOSPITAL_BASED_OUTPATIENT_CLINIC_OR_DEPARTMENT_OTHER): Payer: Self-pay | Admitting: Emergency Medicine

## 2022-08-22 ENCOUNTER — Other Ambulatory Visit: Payer: Self-pay

## 2022-08-22 ENCOUNTER — Emergency Department (HOSPITAL_BASED_OUTPATIENT_CLINIC_OR_DEPARTMENT_OTHER)
Admission: EM | Admit: 2022-08-22 | Discharge: 2022-08-22 | Disposition: A | Discharge status: Home / Self Care | Payer: 59 | Attending: Emergency Medicine | Admitting: Emergency Medicine

## 2022-08-22 DIAGNOSIS — Z79899 Other long term (current) drug therapy: Secondary | ICD-10-CM | POA: Insufficient documentation

## 2022-08-22 DIAGNOSIS — E039 Hypothyroidism, unspecified: Secondary | ICD-10-CM | POA: Diagnosis not present

## 2022-08-22 DIAGNOSIS — M545 Low back pain, unspecified: Secondary | ICD-10-CM | POA: Diagnosis present

## 2022-08-22 DIAGNOSIS — N201 Calculus of ureter: Secondary | ICD-10-CM | POA: Insufficient documentation

## 2022-08-22 LAB — URINALYSIS, ROUTINE W REFLEX MICROSCOPIC
Bilirubin Urine: NEGATIVE
Glucose, UA: NEGATIVE mg/dL
Hgb urine dipstick: NEGATIVE
Ketones, ur: NEGATIVE mg/dL
Leukocytes,Ua: NEGATIVE
Nitrite: NEGATIVE
Protein, ur: NEGATIVE mg/dL
Specific Gravity, Urine: 1.005 (ref 1.005–1.030)
pH: 7 (ref 5.0–8.0)

## 2022-08-22 LAB — BASIC METABOLIC PANEL
Anion gap: 9 (ref 5–15)
BUN: 10 mg/dL (ref 8–23)
CO2: 24 mmol/L (ref 22–32)
Calcium: 9.2 mg/dL (ref 8.9–10.3)
Chloride: 109 mmol/L (ref 98–111)
Creatinine, Ser: 0.72 mg/dL (ref 0.44–1.00)
GFR, Estimated: >60 mL/min (ref >60)
Glucose, Bld: 89 mg/dL (ref 70–99)
Potassium: 3.8 mmol/L (ref 3.5–5.1)
Sodium: 142 mmol/L (ref 135–145)

## 2022-08-22 LAB — CBC
HCT: 41.7 % (ref 36.0–46.0)
Hemoglobin: 13.8 g/dL (ref 12.0–15.0)
MCH: 31.1 pg (ref 26.0–34.0)
MCHC: 33.1 g/dL (ref 30.0–36.0)
MCV: 93.9 fL (ref 80.0–100.0)
Platelets: 185 10*3/uL (ref 150–400)
RBC: 4.44 MIL/uL (ref 3.87–5.11)
RDW: 14 % (ref 11.5–15.5)
WBC: 7 10*3/uL (ref 4.0–10.5)
nRBC: 0 % (ref 0.0–0.2)

## 2022-08-22 MED ORDER — KETOROLAC TROMETHAMINE 10 MG PO TABS: 10.0000 mg | ORAL_TABLET | Freq: Four times a day (QID) | ORAL | 0 refills | Status: DC | PRN

## 2022-08-22 MED ORDER — OXYCODONE-ACETAMINOPHEN 5-325 MG PO TABS
1.0000 | ORAL_TABLET | Freq: Once | ORAL | Status: AC
  Administered 2022-08-22: 1 via ORAL

## 2022-08-22 MED ORDER — TAMSULOSIN HCL 0.4 MG PO CAPS: 0.4000 mg | ORAL_CAPSULE | Freq: Every day | ORAL | 0 refills | Status: DC | PRN

## 2022-08-22 MED ORDER — OXYCODONE HCL 5 MG PO TABS: 5.0000 mg | ORAL_TABLET | Freq: Four times a day (QID) | ORAL | 0 refills | Status: DC | PRN

## 2022-08-22 MED ORDER — ONDANSETRON 4 MG PO TBDP: 4.0000 mg | ORAL_TABLET | Freq: Three times a day (TID) | ORAL | 0 refills | Status: DC | PRN

## 2022-08-22 MED ORDER — TAMSULOSIN HCL 0.4 MG PO CAPS
0.4000 mg | ORAL_CAPSULE | Freq: Once | ORAL | Status: AC
  Administered 2022-08-22: 0.4 mg via ORAL

## 2022-08-22 NOTE — ED Provider Notes (Signed)
[place] EMERGENCY DEPARTMENT AT [facility] Provider Note   CSN: [id number] Arrival date & time: 08/22/22  1308     History  Chief Complaint  Patient presents with   Back Pain    Helen Freeman is a 62 y.o. female.   Back Pain   62 year old female presents to ED with complaints of left sided low back pain. 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 Denies radiation of pain. States she works for [employer] office and was found to have blood in her urine on Wednesday and then [place] evidence of hydronephrosis yesterday. Due to persistence of symptoms, prompted visit to ED. Denies any fever, chills, night sweats, cp, shob, n/v, dysuria/urinary frequency/urgency, vaginal symptoms, change in bowel habits.  PMHx significant for hypothyroidism, osa, hashimoto's thyroiditis, fibromyalgia, fibroids, chronic gastritis, osa, obesity, discoid lupus  Home Medications Prior to Admission medications   Medication Sig Start Date End Date Taking? Authorizing Provider  ketorolac (TORADOL) 10 MG tablet Take 1 tablet (10 mg total) by mouth every 6 (six) hours as needed. 08/22/22  Yes Sherian Maroon A, PA  ondansetron (ZOFRAN-ODT) 4 MG disintegrating tablet Take 1 tablet (4 mg total) by mouth every 8 (eight) hours as needed for nausea or vomiting. 08/22/22  Yes Sherian Maroon A, PA  oxyCODONE (ROXICODONE) 5 MG immediate release tablet Take 1 tablet (5 mg total) by mouth every 6 (six) hours as needed for severe pain or breakthrough pain. 08/22/22  Yes Sherian Maroon A, PA  tamsulosin (FLOMAX) 0.4 MG CAPS capsule Take 1 capsule (0.4 mg total) by mouth daily as needed (Left flank pain). 08/22/22  Yes Sherian Maroon A, PA  atorvastatin (LIPITOR) 20 MG tablet Take 1 tablet (20 mg total) by mouth daily. 10/11/21   McLean-Scocuzza, Pasty Spillers, MD  azelastine (ASTELIN) 0.1 % nasal spray Place 1 spray  into both nostrils 2 (two) times daily. Use in each nostril as directed 02/28/22   Birder Robson, MD  Carboxymethylcellulose Sodium 0.25 % SOLN Administer 2 drops to both eyes daily as needed.    [provider]  cetirizine (ZYRTEC) 10 MG tablet Take 1 tablet (10 mg total) by mouth daily. 06/06/22   Allegra Grana, FNP  cyclobenzaprine (FLEXERIL) 5 MG tablet Take 1 tablet (5 mg total) by mouth 3 (three) times daily as needed for muscle spasms. 01/17/22   Allegra Grana, FNP  cycloSPORINE (RESTASIS) 0.05 % ophthalmic emulsion Administer 2 drops to both eyes every morning.    [provider]  DULoxetine (CYMBALTA) 60 MG capsule Take 1 capsule (60 mg total) by mouth daily. 10/14/21   McLean-Scocuzza, Pasty Spillers, MD  Eszopiclone 3 MG TABS Take 1 tablet (3 mg total) by mouth at bedtime as needed. Take immediately before bedtime 10/11/21   McLean-Scocuzza, Pasty Spillers, MD  fluticasone [facility]) 50 MCG/ACT nasal spray Place 2 sprays into both nostrils daily as needed for allergies or rhinitis. fluticasone propionate 50 mcg/actuation nasal spray,suspension 10/11/21   McLean-Scocuzza, Pasty Spillers, MD  fluticasone (FLONASE) 50 MCG/ACT nasal spray Place 2 sprays into both nostrils daily. 02/28/22   Birder Robson, MD  folic acid (FOLVITE) 1 MG tablet Take 1 mg by mouth daily. 04/11/19   [provider]  gabapentin (NEURONTIN) 100 MG capsule Take 1 capsule (100 mg total) by mouth at bedtime. 10/16/21   McLean-Scocuzza, Pasty Spillers, MD  hydrocortisone  2.5 % cream Apply topically 2 (two) times daily. Prn neck 11/08/19   McLean-Scocuzza, Pasty Spillers, MD  ipratropium (ATROVENT) 0.06 % nasal spray Place 2 sprays into both nostrils 4 (four) times daily. 10/11/21   McLean-Scocuzza, Pasty Spillers, MD  levothyroxine (SYNTHROID) 150 MCG tablet TAKE 1 TABLET BY MOUTH DAILY 05/15/22   Allegra Grana, FNP  metFORMIN (GLUCOPHAGE-XR) 500 MG 24 hr tablet TAKE FOUR TABLETS BY MOUTH EVERY EVENING 08/08/22   Allegra Grana, FNP   Methotrexate 2.5 MG/ML SOLN 15 mg once a week. 03/14/19   [provider]  rizatriptan (MAXALT-MLT) 10 MG disintegrating tablet Take 1 tablet (10 mg total) by mouth as needed for migraine. May repeat in 2 hours if needed. No more than 2x per week. No more than 30 mg in 24 hrs 10/11/21   McLean-Scocuzza, Pasty Spillers, MD  sodium chloride (OCEAN) 0.65 % SOLN nasal spray Place 2 sprays into both nostrils as needed for congestion. 10/11/21   McLean-Scocuzza, Pasty Spillers, MD  topiramate (TOPAMAX) 25 MG tablet Take 1 tablet (25 mg total) by mouth 2 (two) times daily. 10/11/21   McLean-Scocuzza, Pasty Spillers, MD  traZODone (DESYREL) 50 MG tablet Take 1 tablet (50 mg total) by mouth at bedtime. Sleep, depression 01/17/22   Allegra Grana, FNP      Allergies    Plaquenil  [hydroxychloroquine sulfate], Lactose intolerance (gi), Other, and Plaquenil [hydroxychloroquine]    Review of Systems   Review of Systems  Musculoskeletal:  Positive for back pain.  All other systems reviewed and are negative.   Physical Exam Updated Vital Signs BP 128/76   Pulse 63   Temp 97.7 F (36.5 C) (Oral)   Resp 16   SpO2 99%  Physical Exam Vitals and nursing note reviewed.  Constitutional:      General: She is not in acute distress.    Appearance: She is well-developed.  HENT:     Head: Normocephalic and atraumatic.  Eyes:     Conjunctiva/sclera: Conjunctivae normal.  Cardiovascular:     Rate and Rhythm: Normal rate and regular rhythm.     Heart sounds: No murmur heard. Pulmonary:     Effort: Pulmonary effort is normal. No respiratory distress.     Breath sounds: Normal breath sounds. No wheezing or rales.  Abdominal:     General: There is no distension.     Palpations: Abdomen is soft.     Tenderness: There is no abdominal tenderness. There is left CVA tenderness. There is no guarding.  Musculoskeletal:        General: No swelling.     Cervical back: Neck supple.  Skin:    General: Skin is warm and dry.      Capillary Refill: Capillary refill takes less than 2 seconds.  Neurological:     Mental Status: She is alert.  Psychiatric:        Mood and Affect: Mood normal.     ED Results / Procedures / Treatments   Labs (all labs ordered are listed, but only abnormal results are displayed) Labs Reviewed  URINALYSIS, ROUTINE W REFLEX MICROSCOPIC - Abnormal; Notable for the following components:      Result Value   Color, Urine COLORLESS (*)    All other components within normal limits  BASIC METABOLIC PANEL  CBC    EKG None  Radiology CT Renal Stone Study  Result Date: 08/22/2022 CLINICAL DATA:  Abdominal/flank pain, stone suspected left. EXAM: CT ABDOMEN AND PELVIS WITHOUT CONTRAST  TECHNIQUE: Multidetector CT imaging of the abdomen and pelvis was performed following the standard protocol without IV contrast. RADIATION DOSE REDUCTION: This exam was performed according to the departmental dose-optimization program which includes automated exposure control, adjustment of the mA and/or kV according to patient size and/or use of iterative reconstruction technique. COMPARISON:  CT abdomen/pelvis 06/05/2021. FINDINGS: Lower chest: No acute abnormality.  Coronary artery calcifications. Hepatobiliary: No focal liver abnormality is seen. Status post cholecystectomy. No biliary dilatation. Pancreas: Unremarkable. No pancreatic ductal dilatation or surrounding inflammatory changes. Spleen: Normal in size without focal abnormality. Adrenals/Urinary Tract: Adrenal glands are unremarkable. Likely 2 adjacent stones in the proximal left ureter, measuring up to 6.5 mm (coronal image 54 series 5) with associated mild hydroureteronephrosis. Punctate calyceal calculi in the left kidney. Normal right kidney. Bladder is unremarkable. Stomach/Bowel: Postoperative changes of prior sleeve gastrectomy. No dilated loops of small bowel. Appendix is not visualized. Colon is unremarkable. No bowel wall thickening or surrounding  inflammation. Vascular/Lymphatic: Aortic atherosclerosis. No enlarged abdominal or pelvic lymph nodes. Reproductive: Status post hysterectomy. No adnexal masses. Other: No abdominal wall hernia or abnormality. No abdominopelvic ascites. Musculoskeletal: No acute or significant osseous findings. IMPRESSION: 1. Likely 2 adjacent stones in the proximal left ureter, measuring up to 6.5 mm with associated mild hydroureteronephrosis. 2. Punctate calyceal calculi in the left kidney. Aortic Atherosclerosis (ICD10-I70.0). Electronically Signed   By: Orvan Falconer M.D.   On: 08/22/2022 14:30    Procedures Procedures    Medications Ordered in ED Medications  oxyCODONE-acetaminophen (PERCOCET/ROXICET) 5-325 MG per tablet 1 tablet (1 tablet Oral Given 08/22/22 1413)  tamsulosin (FLOMAX) capsule 0.4 mg (0.4 mg Oral Given 08/22/22 1508)    ED Course/ Medical Decision Making/ A&P                             Medical Decision Making Amount and/or Complexity of Data Reviewed Labs: ordered. Radiology: ordered.  Risk Prescription drug management.   This patient presents to the ED for concern of flank pain, this involves an extensive number of treatment options, and is a complaint that carries with it a high risk of complications and morbidity.  The differential diagnosis includes nephrolithiasis, pyelonephritis, diverticulitis, appendicitis, SBO/LBO, volvulus, pancreatitis, gastritis/pud, cbd pathology, cholecystitis, ovarian torsion   Co morbidities that complicate the patient evaluation  See HPI   Additional history obtained:  Additional history obtained from EMR External records from outside source obtained and reviewed including hospital records   Lab Tests:  I Ordered, and personally interpreted labs.  The pertinent results include:  No electrolyte abnormalities, No renal dysfunction. No leukocytosis. No anemia, No platelet abnormalities. UA without abnormalities   Imaging Studies  ordered:  I ordered imaging studies including CT renal stone  I independently visualized and interpreted imaging which showed Likely 2 adjacent stone in proximal left ureter, up to 6.57mm causing mild hydro. Punctate calyceal calculi in left kidney. Aortic atherosclerosis I agree with the radiologist interpretation  Cardiac Monitoring: / EKG:  The patient was maintained on a cardiac monitor.  I personally viewed and interpreted the cardiac monitored which showed an underlying rhythm of: sinus rhythm   Consultations Obtained:  N/a   Problem List / ED Course / Critical interventions / Medication management  Ureterolithiasis  I ordered medication including percocet, flomax   Reevaluation of the patient after these medicines showed that the patient improved I have reviewed the patients home medicines and have made adjustments as needed  Social Determinants of Health:  Former cigarette use. Denies illicit drug use   Test / Admission - Considered:  Ureterolithiasis  Vitals signs within normal range and stable throughout visit. Laboratory/imaging studies significant for: see aboce 62 year old female presents emergency department with complaints of left-sided flank pain for the past 2 days.  Patient symptoms most likely secondary to to 6.5 mm stones in left-sided ureter causing mild left-sided hydronephrosis.  Patient without evidence of acute kidney injury or infection on UA or meeting of SIRS criteria.  Will treat patient symptoms and recommend follow-up with urology in the outpatient setting.  Strict return precautions discussed at length if development of fever, intractable pain/nausea, symptoms concerning for urinary tract infection.  Treatment plan discussed at length with patient and she acknowledged nursing was agreeable to said plan.  Patient well-appearing, afebrile in no acute distress. Worrisome signs and symptoms were discussed with the patient, and the patient acknowledged  understanding to return to the ED if noticed. Patient was stable upon discharge.          Final Clinical Impression(s) / ED Diagnoses Final diagnoses:  Ureterolithiasis    Rx / DC Orders ED Discharge Orders          Ordered    tamsulosin (FLOMAX) 0.4 MG CAPS capsule  Daily PRN        08/22/22 1453    ondansetron (ZOFRAN-ODT) 4 MG disintegrating tablet  Every 8 hours PRN        08/22/22 1453    ketorolac (TORADOL) 10 MG tablet  Every 6 hours PRN        08/22/22 1453    oxyCODONE (ROXICODONE) 5 MG immediate release tablet  Every 6 hours PRN        08/22/22 1512              Peter Garter, [place] 08/23/22 1005    Blane Ohara, MD 08/31/22 1506

## 2022-08-22 NOTE — ED Notes (Signed)
Patient verbalizes understanding of discharge instructions. Opportunity for questioning and answers were provided. Patient discharged from ED.  °

## 2022-08-22 NOTE — Discharge Instructions (Addendum)
As discussed, you have two stones causing obstruction in you left ureter about 6.5 mm in size. We will send you home with medication called flomax to take to help aid in stone's passage as well as toradol to help with pain and zofran as needed for nausea. Please call number attached to your discharge papers to set up an appointment with urology. Please do not hesitate to return to the ED if you develop fever, pain that isn't responding to medication, symptoms concerning for urine infection, nausea/ vomiting that isn't controlled.

## 2022-08-22 NOTE — ED Triage Notes (Signed)
Pt presents to ED POV. Pt c/o intermittent L lower back pain x2d. Pt reports that she had UA done that showed blood in urine. Hx of kidney stones.

## 2022-08-25 ENCOUNTER — Other Ambulatory Visit: Payer: Self-pay | Admitting: Urology

## 2022-08-25 ENCOUNTER — Encounter (HOSPITAL_BASED_OUTPATIENT_CLINIC_OR_DEPARTMENT_OTHER): Payer: Self-pay | Admitting: Urology

## 2022-08-25 ENCOUNTER — Telehealth: Payer: Self-pay

## 2022-08-25 NOTE — Transitions of Care (Post Inpatient/ED Visit) (Signed)
I spoke with pt who was seen [facility] on 08/22/22 with kidney stone;pt saw [facility] 08/25/22 and pt has surgery scheduled with [facility] at [facility] on 09/02/22.  Pt is managing pain with pain meds given at ED on 08/22/22; toradol 10 mg q6hprn, ondansetron 4 mg q 8 h prn, oxycodone 5 mg taking q 6 h prn and tamsulosin 0.4 mg dailty  prn,  pt said since she has seen urology pt will call PCP if appt needed. Sending note to Rennie Plowman FNP.  .   08/25/2022  Name: Helen Freeman MRN: [id number] DOB: 05/30/1960  Today's TOC FU Call Status: Today's TOC FU Call Status:: Successful TOC FU Call Competed TOC FU Call Complete Date: 08/25/22  Transition Care Management Follow-up Telephone Call Date of Discharge: 08/22/22 Discharge Facility: [facility] ([facility]) Type of Discharge: Emergency Department Reason for ED Visit: Other: (kidney stone;pt saw [facility] 08/25/22 and  pt has surgery scheduled [facility]  on 09/02/22.) How have you been since you were released from the hospital?: Same Any questions or concerns?: No  Items Reviewed: Did you receive and understand the discharge instructions provided?: Yes Medications obtained,verified, and reconciled?: Partial Review Completed Reason for Partial Mediation Review: reviewed medication given b y ED on 07-/19/24. Any new allergies since your discharge?: No Dietary orders reviewed?: NA Do you have support at home?: Yes People in Home: spouse Name of Support/Comfort Primary Source: Amy  Medications Reviewed Today: Medications Reviewed Today     Reviewed by Mitchel Honour, RN (Registered Nurse) on 08/22/22 at 1326  Med List Status: <None>   Medication Order Taking? Sig Documenting Provider Last Dose Status Informant  atorvastatin (LIPITOR) 20 MG tablet [id number]  Take 1 tablet (20 mg total) by mouth daily. McLean-Scocuzza, Pasty Spillers, MD  Active   azelastine  (ASTELIN) 0.1 % nasal spray [id number]  Place 1 spray into both nostrils 2 (two) times daily. Use in each nostril as directed Birder Robson, MD  Active   Carboxymethylcellulose Sodium 0.25 % SOLN [id number]  Administer 2 drops to both eyes daily as needed. [provider]  Active   cetirizine (ZYRTEC) 10 MG tablet [id number]  Take 1 tablet (10 mg total) by mouth daily. Allegra Grana, FNP  Active   cyclobenzaprine (FLEXERIL) 5 MG tablet [id number]  Take 1 tablet (5 mg total) by mouth 3 (three) times daily as needed for muscle spasms. Allegra Grana, FNP  Active   cycloSPORINE (RESTASIS) 0.05 % ophthalmic emulsion [id number]  Administer 2 drops to both eyes every morning. [provider]  Active   DULoxetine (CYMBALTA) 60 MG capsule [id number]  Take 1 capsule (60 mg total) by mouth daily. McLean-Scocuzza, Pasty Spillers, MD  Active   Eszopiclone 3 MG TABS [id number]  Take 1 tablet (3 mg total) by mouth at bedtime as needed. Take immediately before bedtime McLean-Scocuzza, Pasty Spillers, MD  Active   fluticasone (FLONASE) 50 MCG/ACT nasal spray [id number]  Place 2 sprays into both nostrils daily as needed for allergies or rhinitis. fluticasone propionate 50 mcg/actuation nasal spray,suspension McLean-Scocuzza, Pasty Spillers, MD  Active   fluticasone (FLONASE) 50 MCG/ACT nasal spray [id number]  Place 2 sprays into both nostrils daily. Birder Robson, MD  Active   folic acid (FOLVITE) 1 MG tablet [id number]  Take 1 mg by mouth daily. [provider]  Active   gabapentin (NEURONTIN) 100 MG capsule [id number]  Take 1 capsule (100 mg total) by mouth at  bedtime. McLean-Scocuzza, Pasty Spillers, MD  Active   HYDROcodone-acetaminophen [facility]) 10-325 MG tablet [id number]  Take 1 tablet by mouth 3 (three) times daily as needed. [provider]  Active   hydrocortisone 2.5 % cream [id number]  Apply topically 2 (two) times daily. Prn neck McLean-Scocuzza, Pasty Spillers, MD  Active   ipratropium (ATROVENT) 0.06 %  nasal spray [id number]  Place 2 sprays into both nostrils 4 (four) times daily. McLean-Scocuzza, Pasty Spillers, MD  Active   levothyroxine (SYNTHROID) 150 MCG tablet [id number]  TAKE 1 TABLET BY MOUTH DAILY Allegra Grana, FNP  Active   metFORMIN (GLUCOPHAGE-XR) 500 MG 24 hr tablet [id number]  TAKE FOUR TABLETS BY MOUTH EVERY EVENING Allegra Grana, FNP  Active   Methotrexate 2.5 MG/ML SOLN [id number]  15 mg once a week. [provider]  Active   morphine (MSIR) 15 MG tablet [id number]  Take 15 mg by mouth every 4 (four) hours as needed.  [provider]  Active   rizatriptan (MAXALT-MLT) 10 MG disintegrating tablet [id number]  Take 1 tablet (10 mg total) by mouth as needed for migraine. May repeat in 2 hours if needed. No more than 2x per week. No more than 30 mg in 24 hrs McLean-Scocuzza, Pasty Spillers, MD  Active   sodium chloride (OCEAN) 0.65 % SOLN nasal spray [id number]  Place 2 sprays into both nostrils as needed for congestion. McLean-Scocuzza, Pasty Spillers, MD  Active   topiramate (TOPAMAX) 25 MG tablet [id number]  Take 1 tablet (25 mg total) by mouth 2 (two) times daily. McLean-Scocuzza, Pasty Spillers, MD  Active   traZODone (DESYREL) 50 MG tablet [id number]  Take 1 tablet (50 mg total) by mouth at bedtime. Sleep, depression Allegra Grana, FNP  Active             Home Care and Equipment/Supplies: Were Home Health Services Ordered?: NA Any new equipment or medical supplies ordered?: NA  Functional Questionnaire: Do you need assistance with bathing/showering or dressing?: No Do you need assistance with meal preparation?: No Do you need assistance with eating?: No Do you have difficulty maintaining continence: No Do you need assistance with getting out of bed/getting out of a chair/moving?: No Do you have difficulty managing or taking your medications?: No  Follow up appointments reviewed: PCP Follow-up appointment confirmed?: NA (pt dfoes not feel like she needs FU with PCP since  saw urology today . pt will call [facility] if appt needed.) Specialist Hospital Follow-up appointment confirmed?: Yes Date of Specialist follow-up appointment?: 08/25/22 Follow-Up Specialty Provider:: [facility] and pt scheduled for surgery at [facility] on 09/02/22 Do you need transportation to your follow-up appointment?: No Do you understand care options if your condition(s) worsen?: Yes-patient verbalized understanding    SIGNATURE Lewanda Rife, LPN

## 2022-08-25 NOTE — Telephone Encounter (Signed)
noted 

## 2022-08-26 ENCOUNTER — Other Ambulatory Visit: Payer: Self-pay

## 2022-08-26 ENCOUNTER — Encounter (HOSPITAL_BASED_OUTPATIENT_CLINIC_OR_DEPARTMENT_OTHER): Payer: Self-pay | Admitting: Urology

## 2022-08-26 NOTE — Progress Notes (Signed)
Spoke w/ via phone for pre-op interview---pt Lab needs dos----none               Lab results------cbc, bmp 08-22-2022 epic COVID test -----patient states asymptomatic no test needed Arrive at -------700 am 09-02-2022 NPO after MN NO Solid Food.  Clear liquids from MN until---600 Med rec completed Medications to take morning of surgery -----duloxetine, levothyroxine, topirimate, oxycodone prn, tamsulosin prn Diabetic medication -----no metformin day of surgery Patient instructed no nail polish to be worn day of surgery Patient instructed to bring photo id and insurance card day of surgery Patient aware to have Driver (ride ) / caregiver shawna mother in law    for 24 hours after surgery  Patient Special Instructions -----bring cpap mask tubing and machine and leave in car. Pre-Op special Instructions -----none Patient verbalized understanding of instructions that were given at this phone interview. Patient denies shortness of breath, chest pain, fever, cough at this phone interview.

## 2022-09-01 ENCOUNTER — Other Ambulatory Visit: Payer: Self-pay | Admitting: Family

## 2022-09-01 DIAGNOSIS — J309 Allergic rhinitis, unspecified: Secondary | ICD-10-CM

## 2022-09-01 NOTE — Anesthesia Preprocedure Evaluation (Signed)
Anesthesia Evaluation  Patient identified by MRN, date of birth, ID band Patient awake    Reviewed: Allergy & Precautions, NPO status , Patient's Chart, lab work & pertinent test results  Airway Mallampati: III  TM Distance: >3 FB Neck ROM: Full    Dental  (+) Teeth Intact, Dental Advisory Given   Pulmonary sleep apnea (uses cpap occasionally) , former smoker   Pulmonary exam normal breath sounds clear to auscultation       Cardiovascular negative cardio ROS Normal cardiovascular exam Rhythm:Regular Rate:Normal     Neuro/Psych  Headaches PSYCHIATRIC DISORDERS Anxiety Depression       GI/Hepatic Neg liver ROS, hiatal hernia,,,  Endo/Other  diabetes (prediabetic)Hypothyroidism  BMI 34  Renal/GU Renal disease (L ureteral calculus)  negative genitourinary   Musculoskeletal  (+) Arthritis , Osteoarthritis,  Fibromyalgia -, narcotic dependentOxy 5mg - takes 1-2x/d   Abdominal  (+) + obese  Peds  Hematology negative hematology ROS (+) Hb 13.8, plt 185   Anesthesia Other Findings   Reproductive/Obstetrics negative OB ROS                             Anesthesia Physical Anesthesia Plan  ASA: 2  Anesthesia Plan: General   Post-op Pain Management: Tylenol PO (pre-op)*   Induction: Intravenous  PONV Risk Score and Plan: 4 or greater and Ondansetron, Dexamethasone, Midazolam and Treatment may vary due to age or medical condition  Airway Management Planned: LMA  Additional Equipment: None  Intra-op Plan:   Post-operative Plan: Extubation in OR  Informed Consent: I have reviewed the patients History and Physical, chart, labs and discussed the procedure including the risks, benefits and alternatives for the proposed anesthesia with the patient or authorized representative who has indicated his/her understanding and acceptance.     Dental advisory given  Plan Discussed with:  CRNA  Anesthesia Plan Comments:        Anesthesia Quick Evaluation

## 2022-09-02 ENCOUNTER — Encounter (HOSPITAL_BASED_OUTPATIENT_CLINIC_OR_DEPARTMENT_OTHER)
Admission: RE | Disposition: A | Discharge status: Home / Self Care | Payer: Self-pay | Source: Home / Self Care | Attending: Urology

## 2022-09-02 ENCOUNTER — Ambulatory Visit (HOSPITAL_BASED_OUTPATIENT_CLINIC_OR_DEPARTMENT_OTHER): Payer: 59 | Admitting: Anesthesiology

## 2022-09-02 ENCOUNTER — Encounter (HOSPITAL_BASED_OUTPATIENT_CLINIC_OR_DEPARTMENT_OTHER): Payer: Self-pay | Admitting: Urology

## 2022-09-02 ENCOUNTER — Ambulatory Visit (HOSPITAL_BASED_OUTPATIENT_CLINIC_OR_DEPARTMENT_OTHER)
Admission: RE | Admit: 2022-09-02 | Discharge: 2022-09-02 | Disposition: A | Discharge status: Home / Self Care | Payer: 59 | Attending: Urology | Admitting: Urology

## 2022-09-02 DIAGNOSIS — Z87891 Personal history of nicotine dependence: Secondary | ICD-10-CM | POA: Insufficient documentation

## 2022-09-02 DIAGNOSIS — N201 Calculus of ureter: Secondary | ICD-10-CM | POA: Diagnosis not present

## 2022-09-02 DIAGNOSIS — L309 Dermatitis, unspecified: Secondary | ICD-10-CM | POA: Diagnosis not present

## 2022-09-02 DIAGNOSIS — Z01818 Encounter for other preprocedural examination: Secondary | ICD-10-CM

## 2022-09-02 DIAGNOSIS — Z09 Encounter for follow-up examination after completed treatment for conditions other than malignant neoplasm: Secondary | ICD-10-CM | POA: Diagnosis not present

## 2022-09-02 DIAGNOSIS — E669 Obesity, unspecified: Secondary | ICD-10-CM | POA: Diagnosis not present

## 2022-09-02 DIAGNOSIS — M797 Fibromyalgia: Secondary | ICD-10-CM | POA: Insufficient documentation

## 2022-09-02 DIAGNOSIS — L409 Psoriasis, unspecified: Secondary | ICD-10-CM | POA: Insufficient documentation

## 2022-09-02 DIAGNOSIS — G4700 Insomnia, unspecified: Secondary | ICD-10-CM | POA: Insufficient documentation

## 2022-09-02 DIAGNOSIS — F32A Depression, unspecified: Secondary | ICD-10-CM | POA: Diagnosis not present

## 2022-09-02 DIAGNOSIS — Z9049 Acquired absence of other specified parts of digestive tract: Secondary | ICD-10-CM | POA: Insufficient documentation

## 2022-09-02 DIAGNOSIS — K2950 Unspecified chronic gastritis without bleeding: Secondary | ICD-10-CM | POA: Diagnosis not present

## 2022-09-02 DIAGNOSIS — Z7984 Long term (current) use of oral hypoglycemic drugs: Secondary | ICD-10-CM | POA: Diagnosis not present

## 2022-09-02 DIAGNOSIS — Z9884 Bariatric surgery status: Secondary | ICD-10-CM | POA: Insufficient documentation

## 2022-09-02 DIAGNOSIS — N132 Hydronephrosis with renal and ureteral calculous obstruction: Secondary | ICD-10-CM | POA: Insufficient documentation

## 2022-09-02 DIAGNOSIS — E039 Hypothyroidism, unspecified: Secondary | ICD-10-CM | POA: Insufficient documentation

## 2022-09-02 DIAGNOSIS — G4733 Obstructive sleep apnea (adult) (pediatric): Secondary | ICD-10-CM | POA: Insufficient documentation

## 2022-09-02 DIAGNOSIS — Z79891 Long term (current) use of opiate analgesic: Secondary | ICD-10-CM | POA: Diagnosis not present

## 2022-09-02 DIAGNOSIS — E119 Type 2 diabetes mellitus without complications: Secondary | ICD-10-CM | POA: Insufficient documentation

## 2022-09-02 DIAGNOSIS — I700 Atherosclerosis of aorta: Secondary | ICD-10-CM | POA: Diagnosis not present

## 2022-09-02 DIAGNOSIS — L940 Localized scleroderma [morphea]: Secondary | ICD-10-CM | POA: Insufficient documentation

## 2022-09-02 DIAGNOSIS — K760 Fatty (change of) liver, not elsewhere classified: Secondary | ICD-10-CM | POA: Diagnosis not present

## 2022-09-02 DIAGNOSIS — F419 Anxiety disorder, unspecified: Secondary | ICD-10-CM | POA: Insufficient documentation

## 2022-09-02 DIAGNOSIS — L930 Discoid lupus erythematosus: Secondary | ICD-10-CM | POA: Diagnosis not present

## 2022-09-02 DIAGNOSIS — Z6834 Body mass index (BMI) 34.0-34.9, adult: Secondary | ICD-10-CM | POA: Insufficient documentation

## 2022-09-02 DIAGNOSIS — E7800 Pure hypercholesterolemia, unspecified: Secondary | ICD-10-CM | POA: Insufficient documentation

## 2022-09-02 DIAGNOSIS — K449 Diaphragmatic hernia without obstruction or gangrene: Secondary | ICD-10-CM | POA: Diagnosis not present

## 2022-09-02 HISTORY — DX: Personal history of urinary calculi: Z87.442

## 2022-09-02 HISTORY — PX: CYSTOSCOPY/URETEROSCOPY/HOLMIUM LASER/STENT PLACEMENT: SHX6546

## 2022-09-02 HISTORY — DX: Lateral epicondylitis, right elbow: M77.11

## 2022-09-02 HISTORY — DX: Migraine, unspecified, not intractable, without status migrainosus: G43.909

## 2022-09-02 SURGERY — CYSTOSCOPY/URETEROSCOPY/HOLMIUM LASER/STENT PLACEMENT
Anesthesia: General | Site: Renal | Laterality: Left

## 2022-09-02 MED ORDER — DEXMEDETOMIDINE HCL IN NACL 80 MCG/20ML IV SOLN
INTRAVENOUS | Status: DC | PRN
  Administered 2022-09-02: 12 ug via INTRAVENOUS
  Administered 2022-09-02: 4 ug via INTRAVENOUS

## 2022-09-02 MED ORDER — OXYCODONE HCL 5 MG PO TABS
ORAL_TABLET | ORAL | Status: AC
  Filled 2022-09-02: qty 1

## 2022-09-02 MED ORDER — OXYBUTYNIN CHLORIDE 5 MG PO TABS: 5.0000 mg | ORAL_TABLET | Freq: Three times a day (TID) | ORAL | 1 refills | Status: DC | PRN

## 2022-09-02 MED ORDER — PHENAZOPYRIDINE HCL 200 MG PO TABS: 200.0000 mg | ORAL_TABLET | Freq: Three times a day (TID) | ORAL | 0 refills | Status: DC | PRN

## 2022-09-02 MED ORDER — ONDANSETRON HCL 4 MG/2ML IJ SOLN
INTRAMUSCULAR | Status: AC
  Filled 2022-09-02: qty 2

## 2022-09-02 MED ORDER — ONDANSETRON HCL 4 MG/2ML IJ SOLN
INTRAMUSCULAR | Status: DC | PRN
  Administered 2022-09-02: 4 mg via INTRAVENOUS

## 2022-09-02 MED ORDER — LIDOCAINE HCL (PF) 2 % IJ SOLN
INTRAMUSCULAR | Status: AC
  Filled 2022-09-02: qty 5

## 2022-09-02 MED ORDER — EPHEDRINE 5 MG/ML INJ
INTRAVENOUS | Status: AC
  Filled 2022-09-02: qty 5

## 2022-09-02 MED ORDER — LACTATED RINGERS IV SOLN: INTRAVENOUS | Status: DC

## 2022-09-02 MED ORDER — HYDROMORPHONE HCL 1 MG/ML IJ SOLN
0.2500 mg | INTRAMUSCULAR | Status: DC | PRN
  Administered 2022-09-02: 0.5 mg via INTRAVENOUS
  Administered 2022-09-02 (×2): 0.25 mg via INTRAVENOUS

## 2022-09-02 MED ORDER — DEXAMETHASONE SODIUM PHOSPHATE 10 MG/ML IJ SOLN
INTRAMUSCULAR | Status: AC
  Filled 2022-09-02: qty 1

## 2022-09-02 MED ORDER — CEFAZOLIN SODIUM-DEXTROSE 2-4 GM/100ML-% IV SOLN
2.0000 g | INTRAVENOUS | Status: AC
  Administered 2022-09-02: 2 g via INTRAVENOUS

## 2022-09-02 MED ORDER — OXYCODONE-ACETAMINOPHEN 7.5-325 MG PO TABS: 1.0000 | ORAL_TABLET | ORAL | 0 refills | Status: DC | PRN

## 2022-09-02 MED ORDER — PROPOFOL 10 MG/ML IV BOLUS
INTRAVENOUS | Status: AC
  Filled 2022-09-02: qty 20

## 2022-09-02 MED ORDER — ACETAMINOPHEN 500 MG PO TABS
1000.0000 mg | ORAL_TABLET | Freq: Once | ORAL | Status: AC
  Administered 2022-09-02: 1000 mg via ORAL

## 2022-09-02 MED ORDER — KETOROLAC TROMETHAMINE 30 MG/ML IJ SOLN
INTRAMUSCULAR | Status: AC
  Filled 2022-09-02: qty 1

## 2022-09-02 MED ORDER — SODIUM CHLORIDE 0.9 % IR SOLN
Status: DC | PRN
  Administered 2022-09-02: 3000 mL

## 2022-09-02 MED ORDER — OXYCODONE HCL 5 MG PO TABS
5.0000 mg | ORAL_TABLET | Freq: Once | ORAL | Status: AC | PRN
  Administered 2022-09-02: 5 mg via ORAL

## 2022-09-02 MED ORDER — MIDAZOLAM HCL 5 MG/5ML IJ SOLN
INTRAMUSCULAR | Status: DC | PRN
  Administered 2022-09-02: 2 mg via INTRAVENOUS

## 2022-09-02 MED ORDER — OXYCODONE HCL 5 MG/5ML PO SOLN: 5.0000 mg | Freq: Once | ORAL | Status: AC | PRN

## 2022-09-02 MED ORDER — AMISULPRIDE (ANTIEMETIC) 5 MG/2ML IV SOLN: 10.0000 mg | Freq: Once | INTRAVENOUS | Status: DC | PRN

## 2022-09-02 MED ORDER — KETOROLAC TROMETHAMINE 30 MG/ML IJ SOLN
INTRAMUSCULAR | Status: DC | PRN
  Administered 2022-09-02: 30 mg via INTRAVENOUS

## 2022-09-02 MED ORDER — CEFAZOLIN SODIUM-DEXTROSE 2-4 GM/100ML-% IV SOLN
INTRAVENOUS | Status: AC
  Filled 2022-09-02: qty 100

## 2022-09-02 MED ORDER — 0.9 % SODIUM CHLORIDE (POUR BTL) OPTIME
TOPICAL | Status: DC | PRN
Start: 2022-09-02 — End: 2022-09-02
  Administered 2022-09-02: 500 mL

## 2022-09-02 MED ORDER — ONDANSETRON HCL 4 MG/2ML IJ SOLN: 4.0000 mg | Freq: Once | INTRAMUSCULAR | Status: DC | PRN

## 2022-09-02 MED ORDER — ACETAMINOPHEN 500 MG PO TABS
ORAL_TABLET | ORAL | Status: AC
  Filled 2022-09-02: qty 2

## 2022-09-02 MED ORDER — FENTANYL CITRATE (PF) 100 MCG/2ML IJ SOLN
INTRAMUSCULAR | Status: AC
  Filled 2022-09-02: qty 2

## 2022-09-02 MED ORDER — CEPHALEXIN 500 MG PO CAPS: 500.0000 mg | ORAL_CAPSULE | Freq: Two times a day (BID) | ORAL | 0 refills | Status: AC

## 2022-09-02 MED ORDER — IOHEXOL 300 MG/ML  SOLN
INTRAMUSCULAR | Status: DC | PRN
  Administered 2022-09-02: 6 mL via URETHRAL

## 2022-09-02 MED ORDER — FENTANYL CITRATE (PF) 100 MCG/2ML IJ SOLN
INTRAMUSCULAR | Status: DC | PRN
  Administered 2022-09-02: 100 ug via INTRAVENOUS

## 2022-09-02 MED ORDER — PROPOFOL 10 MG/ML IV BOLUS
INTRAVENOUS | Status: DC | PRN
Start: 2022-09-02 — End: 2022-09-02
  Administered 2022-09-02: 200 mg via INTRAVENOUS

## 2022-09-02 MED ORDER — MIDAZOLAM HCL 2 MG/2ML IJ SOLN
INTRAMUSCULAR | Status: AC
  Filled 2022-09-02: qty 2

## 2022-09-02 MED ORDER — EPHEDRINE SULFATE-NACL 50-0.9 MG/10ML-% IV SOSY
PREFILLED_SYRINGE | INTRAVENOUS | Status: DC | PRN
  Administered 2022-09-02: 10 mg via INTRAVENOUS

## 2022-09-02 MED ORDER — LIDOCAINE 2% (20 MG/ML) 5 ML SYRINGE
INTRAMUSCULAR | Status: DC | PRN
  Administered 2022-09-02: 60 mg via INTRAVENOUS

## 2022-09-02 MED ORDER — HYDROMORPHONE HCL 1 MG/ML IJ SOLN
INTRAMUSCULAR | Status: AC
  Filled 2022-09-02: qty 1

## 2022-09-02 MED ORDER — DEXAMETHASONE SODIUM PHOSPHATE 10 MG/ML IJ SOLN
INTRAMUSCULAR | Status: DC | PRN
  Administered 2022-09-02: 10 mg via INTRAVENOUS

## 2022-09-02 SURGICAL SUPPLY — 31 items
APL SKNCLS STERI-STRIP NONHPOA (GAUZE/BANDAGES/DRESSINGS)
BAG DRAIN URO-CYSTO SKYTR STRL (DRAIN) ×1 IMPLANT
BAG DRN UROCATH (DRAIN) ×1
BASKET STONE 1.7 NGAGE (UROLOGICAL SUPPLIES) IMPLANT
BASKET ZERO TIP NITINOL 2.4FR (BASKET) ×1 IMPLANT
BENZOIN TINCTURE PRP APPL 2/3 (GAUZE/BANDAGES/DRESSINGS) IMPLANT
BSKT STON RTRVL ZERO TP 2.4FR (BASKET) ×1
CATH URETL OPEN 5X70 (CATHETERS) IMPLANT
CLOTH BEACON ORANGE TIMEOUT ST (SAFETY) ×1 IMPLANT
EXTRACTOR STONE 1.7FRX115CM (UROLOGICAL SUPPLIES) IMPLANT
GLOVE BIO SURGEON STRL SZ7.5 (GLOVE) ×1 IMPLANT
GLOVE BIOGEL PI IND STRL 6 (GLOVE) IMPLANT
GLOVE BIOGEL PI IND STRL 7.0 (GLOVE) IMPLANT
GOWN STRL REUS W/TWL LRG LVL3 (GOWN DISPOSABLE) IMPLANT
GOWN STRL REUS W/TWL XL LVL3 (GOWN DISPOSABLE) ×1 IMPLANT
GUIDEWIRE STR DUAL SENSOR (WIRE) IMPLANT
GUIDEWIRE ZIPWRE .038 STRAIGHT (WIRE) ×1 IMPLANT
IV NS IRRIG 3000ML ARTHROMATIC (IV SOLUTION) ×2 IMPLANT
KIT TURNOVER CYSTO (KITS) ×1 IMPLANT
LASER FIB FLEXIVA PULSE ID 365 (Laser) IMPLANT
MANIFOLD NEPTUNE II (INSTRUMENTS) ×1 IMPLANT
NS IRRIG 500ML POUR BTL (IV SOLUTION) ×1 IMPLANT
PACK CYSTO (CUSTOM PROCEDURE TRAY) ×1 IMPLANT
SLEEVE SCD COMPRESS KNEE MED (STOCKING) ×1 IMPLANT
STENT URET 6FRX24 CONTOUR (STENTS) IMPLANT
STRIP CLOSURE SKIN 1/2X4 (GAUZE/BANDAGES/DRESSINGS) IMPLANT
SYR 10ML LL (SYRINGE) ×1 IMPLANT
TRACTIP FLEXIVA PULS ID 200XHI (Laser) IMPLANT
TRACTIP FLEXIVA PULSE ID 200 (Laser) ×1
TUBE CONNECTING 12X1/4 (SUCTIONS) IMPLANT
TUBING UROLOGY SET (TUBING) ×1 IMPLANT

## 2022-09-02 NOTE — Anesthesia Postprocedure Evaluation (Signed)
Anesthesia Post Note  Patient: Helen Freeman  Procedure(s) Performed: CYSTOSCOPY, LEFT URETEROSCOPY, RETROGRADE PYELOGRAM;  HOLMIUM LASER LITHOTRIPSY, AND LEFT URETERAL STENT PLACEMENT (Left: Renal)     Patient location during evaluation: PACU Anesthesia Type: General Level of consciousness: awake and alert, oriented and patient cooperative Pain management: pain level controlled Vital Signs Assessment: post-procedure vital signs reviewed and stable Respiratory status: spontaneous breathing, nonlabored ventilation and respiratory function stable Cardiovascular status: blood pressure returned to baseline and stable Postop Assessment: no apparent nausea or vomiting Anesthetic complications: no   No notable events documented.  Last Vitals:  Vitals:   09/02/22 1006 09/02/22 1015  BP: (!) 112/57 111/69  Pulse: 68 67  Resp: 18 16  Temp: (!) 36.3 C   SpO2: 97% 93%    Last Pain:  Vitals:   09/02/22 1006  TempSrc:   PainSc: 0-No pain                 Lannie Fields

## 2022-09-02 NOTE — Progress Notes (Signed)
Dr. Liliane Shi called to call in more pain medication to pharmacy for pt. Dr. Liliane Shi will call in more for her.   Pt updated

## 2022-09-02 NOTE — Op Note (Signed)
Operative Note  Preoperative diagnosis:  1.  Two 6 mm left UPJ stones  Postoperative diagnosis: 1.  Two 6 mm left UPJ stones 2.  1 cm stenotic segment of the left distal ureter  Procedure(s): 1.  Cystoscopy with left ureteroscopy, holmium laser lithotripsy and left JJ stent placement 2.  Left retrograde pyelogram with intraoperative interpretation of fluoroscopic imaging  Surgeon: Rhoderick Moody, MD  Assistants:  None  Anesthesia:  General  Complications:  None  EBL: Less than 5 mL  Specimens: 1.  Left ureteral stone fragments  Drains/Catheters: 1.  Left 6 French, 24 cm JJ stent without tether  Intraoperative findings:   No intravesical or urethral abnormalities Left retrograde pyelogram revealed small degree of stenosis within the distal aspects of the left ureter.  There was also a filling defect within the proximal aspects of the left ureter, consistent with the obstructing stone seen on recent CT.  There were no filling defects seen within the left renal pelvis and its associated calyces 1 cm stenotic segment of the left distal ureter that was easily bypassed with the semirigid ureteroscope  Indication:  Helen Freeman is a 62 y.o. female with two 6 mm left UPJ stones.  She is here today for definitive stone treatment.  She has been consented for the above procedures, voices understanding and wishes to proceed.  Description of procedure:  After informed consent was obtained, the patient was brought to the operating room and general LMA anesthesia was administered. The patient was then placed in the dorsolithotomy position and prepped and draped in the usual sterile fashion. A timeout was performed. A 23 French rigid cystoscope was then inserted into the urethral meatus and advanced into the bladder under direct vision. A complete bladder survey revealed no intravesical pathology.  A 5 French ureteral catheter was then inserted into the left ureteral orifice and a  retrograde pyelogram was obtained, with the findings listed above.  A Glidewire was then used to intubate the lumen of the ureteral catheter and was advanced up to the left renal pelvis, under fluoroscopic guidance.  The catheter was then removed, leaving the wire in place.  I attempted to advance a flexible ureteroscope, but met resistance in the distal aspects of the left ureter.  I then exchanged the flexible ureteroscope for a semirigid ureteroscope and was able to easily bypassed a 1 cm stenotic area within the distal aspects of the left ureter.  The semirigid ureteroscope allowed for atraumatic dilation of the segment, but I was unable to reach the proximal stone burden.  The semirigid ureteroscope was then exchanged for the flexible ureteroscope, which was atraumatically advanced up the left ureter up to the level of her obstructing stones.  I was able to grasp one of her stones with a stone basket and placed it in the mid ureter.  The second stone migrated up into the left renal pelvis.  A 200 m holmium laser was then used to fracture the mid ureteral stone into multiple smaller pieces.  The stone basket was then used to remove retrieve all stone fragments from the lumen of the left ureter.  The flexible ureteroscope was then advanced up to the left renal pelvis where the migrated second stone was identified in a midpole calyx.  A 200 m holmium laser was then used to dust the stone into less than 2 mm fragments.  The flexible ureteroscope was then removed under direct vision, revealing no evidence of ureteral trauma or luminal stone burden.  A 6 French, 24 cm JJ stent was then advanced over the wire and into good position within the left collecting system, confirming placement via fluoroscopy.  The patient's bladder was drained and all stone fragments were evacuated from the lumen of the bladder through the sheath of the cystoscope.  She tolerated the procedure well and was transferred to the  postanesthesia in stable condition.  Plan: Office cystoscopy and stent removal in 1 week

## 2022-09-02 NOTE — Discharge Instructions (Addendum)
Post Anesthesia Home Care Instructions  Activity: Get plenty of rest for the remainder of the day. A responsible individual must stay with you for 24 hours following the procedure.  For the next 24 hours, DO NOT: -Drive a car -Advertising copywriter -Drink alcoholic beverages -Take any medication unless instructed by your physician -Make any legal decisions or sign important papers.  Meals: Start with liquid foods such as gelatin or soup. Progress to regular foods as tolerated. Avoid greasy, spicy, heavy foods. If nausea and/or vomiting occur, drink only clear liquids until the nausea and/or vomiting subsides. Call your physician if vomiting continues.  Special Instructions/Symptoms: Your throat may feel dry or sore from the anesthesia or the breathing tube placed in your throat during surgery. If this causes discomfort, gargle with warm salt water. The discomfort should disappear within 24 hours.  [facility] [phone] Post Ureteroscopy With Stent Instructions  Definitions:  Ureter: The duct that transports urine from the kidney to the bladder. Stent:   A plastic hollow tube that is placed into the ureter, from the kidney to the bladder to prevent the ureter from swelling shut.  GENERAL INSTRUCTIONS:  Despite the fact that no skin incisions were used, the area around the ureter and bladder is raw and irritated. The stent is a foreign body which will further irritate the bladder wall. This irritation is manifested by increased frequency of urination, both day and night, and by an increase in the urge to urinate. In some, the urge to urinate is present almost always. Sometimes the urge is strong enough that you may not be able to stop yourself from urinating. The only real cure is to remove the stent and then give time for the bladder wall to heal which can't be done until the danger of the ureter swelling shut has passed, which varies.  You may see some blood in your  urine while the stent is in place and a few days afterwards. Do not be alarmed, even if the urine was clear for a while. Get off your feet and drink lots of fluids until clearing occurs. If you start to pass clots or don't improve, call us.  DIET: You may return to your normal diet immediately. Because of the raw surface of your bladder, alcohol, spicy foods, acid type foods and drinks with caffeine may cause irritation or frequency and should be used in moderation. To keep your urine flowing freely and to avoid constipation, drink plenty of fluids during the day ( 8-10 glasses ). Tip: Avoid cranberry juice because it is very acidic.  ACTIVITY: Your physical activity doesn't need to be restricted. However, if you are very active, you may see some blood in your urine. We suggest that you reduce your activity under these circumstances until the bleeding has stopped.  BOWELS: It is important to keep your bowels regular during the postoperative period. Straining with bowel movements can cause bleeding. A bowel movement every other day is reasonable. Use a mild laxative if needed, such as Milk of Magnesia 2-3 tablespoons, or 2 Dulcolax tablets. Call if you continue to have problems. If you have been taking narcotics for pain, before, during or after your surgery, you may be constipated. Take a laxative if necessary.   MEDICATION: You should resume your pre-surgery medications unless told not to. In addition you will often be given an antibiotic to prevent infection. These should be taken as prescribed until the bottles are finished unless you are having an unusual reaction  to one of the drugs.  PROBLEMS YOU SHOULD REPORT TO [place]: Fevers over 100.5 Fahrenheit. Heavy bleeding, or clots ( See above notes about blood in urine ). Inability to urinate. Drug reactions ( hives, rash, nausea, vomiting, diarrhea ). Severe burning or pain with urination that is not improving.  FOLLOW-UP: You will need a  follow-up appointment to monitor your progress. Call for this appointment at the number listed above. Usually the first appointment will be about three to fourteen days after your surgery.  Begin taking Tylenol at 2 PM as needed for soreness/discomfort.

## 2022-09-02 NOTE — Transfer of Care (Signed)
Immediate Anesthesia Transfer of Care Note  Patient: Helen Freeman  Procedure(s) Performed: CYSTOSCOPY, LEFT URETEROSCOPY, RETROGRADE PYELOGRAM;  HOLMIUM LASER LITHOTRIPSY, AND LEFT URETERAL STENT PLACEMENT (Left: Renal)  Patient Location: PACU  Anesthesia Type:General  Level of Consciousness: awake, alert , and oriented  Airway & Oxygen Therapy: Patient Spontanous Breathing and Patient connected to nasal cannula oxygen  Post-op Assessment: Report given to RN and Post -op Vital signs reviewed and stable  Post vital signs: Reviewed and stable  Last Vitals:  Vitals Value Taken Time  BP    Temp    Pulse 68 09/02/22 1006  Resp    SpO2 97 % 09/02/22 1006  Vitals shown include unfiled device data.  Last Pain:  Vitals:   09/02/22 0731  TempSrc: Oral  PainSc: 7       Patients Stated Pain Goal: 5 (09/02/22 0731)  Complications: No notable events documented.

## 2022-09-02 NOTE — H&P (Signed)
PRE-OP H&P  Office Visit Report     08/25/2022   --------------------------------------------------------------------------------   Helen Freeman  MRN: [id number]  DOB: November 23, 1960, 62 year old Female  SSN:   PRIMARY CARE:    REFERRING:    PROVIDER:  Rhoderick Moody, M.D.  LOCATION:  [facility] [phone]     --------------------------------------------------------------------------------   CC: I have pain in the flank.  HPI: Helen Freeman is a 62 year-old female patient who is here for flank pain.  The problem is on the left side. Her pain started about 08/22/2022. The pain is sharp. The pain is intermittent. The pain does not radiate.   Pain medications< makes the pain better. Nothing causes the pain to become worse. She was treated with the following pain medication(s): Oxycodone.   She has had this same pain previously. She has had kidney stones.    -CTSS from 08/22/22 revealed two proximal left ureteral stones ~6 mm with mild hydro  [facility] labs and UA were WNL  -Remote hx of stones and required lithotripsy in the past  -Pain well managed with oxycodone  -Denies N/V/F/C     ALLERGIES: Plaquenil    MEDICATIONS: Levothyroxine Sodium  Metformin Hcl  Zyrtec  Atorvastatin Calcium  Duloxetine Hcl  Flexaril  Flomax 0.4 mg capsule  Flonase Allergy Relief  Folic Acid  Gabapentin  Lunesta PRN  Maxalt  Methotrexate  Morphine Sulfate PRN  Oxycodone Hcl  Topamax  Toradol  Trazodone Hcl  Zofran     GU PSH: No GU PSH      PSH Notes: rotator cuff, pelvic surgeries, appendectomy, cholecystectomy (laparoscopic), vertical gastric sleeve 2019    NON-GU PSH: No Non-GU PSH    GU PMH: None     PMH Notes: Cardiovascular and Mediastinum  Migraine without status migrainosus, not intractable  Aortic atherosclerosis (HCC)   Respiratory  Obstructive sleep apnea syndrome  Allergic rhinitis  Hiatal hernia   Digestive  Chronic gastritis  without bleeding  Fatty liver   Endocrine  Hypothyroidism   Musculoskeletal and Integument  PLE (polymorphic light eruption)  Discoid lupus  Eczema  Morphea  Psoriasis   Genitourinary  Cyst of right kidney  Kidney stones   Other  Obesity (BMI 30-39.9)  Other chronic pain  Fibromyalgia  Anxiety and depression  Prediabetes  History of kidney stones  S/P gastric surgery  Annual physical exam  Insomnia  Iron deficiency      NON-GU PMH: Anxiety Arthritis Depression GERD Hypercholesterolemia Hypothyroidism Sleep Apnea    FAMILY HISTORY: 1 son - Son Bladder Cancer - Father Heart Disease - Father   SOCIAL HISTORY: Marital Status: Married Preferred Language: English; Ethnicity: Not Hispanic Or Latino; Race: White Current Smoking Status: Patient does not smoke anymore.   Tobacco Use Assessment Completed: Used Tobacco in last 30 days? Has never drank.  Drinks 1 caffeinated drink per day.    REVIEW OF SYSTEMS:    GU Review Female:   Patient denies frequent urination, hard to postpone urination, burning /pain with urination, get up at night to urinate, leakage of urine, stream starts and stops, trouble starting your stream, have to strain to urinate, and being pregnant.  Gastrointestinal (Upper):   Patient denies nausea, vomiting, and indigestion/ heartburn.  Gastrointestinal (Lower):   Patient denies diarrhea and constipation.  Constitutional:   Patient denies fever, night sweats, weight loss, and fatigue.  Skin:   Patient denies skin rash/ lesion and itching.  Eyes:   Patient denies blurred vision and double  vision.  Ears/ Nose/ Throat:   Patient denies sore throat and sinus problems.  Hematologic/Lymphatic:   Patient denies swollen glands and easy bruising.  Cardiovascular:   Patient denies leg swelling and chest pains.  Respiratory:   Patient denies cough and shortness of breath.  Endocrine:   Patient denies excessive thirst.  Musculoskeletal:   Patient denies  back pain and joint pain.  Neurological:   Patient denies headaches and dizziness.  Psychologic:   Patient denies depression and anxiety.   VITAL SIGNS:      08/25/2022 12:28 PM  Weight 180 lb / 81.65 kg  Height 61.5 in / 156.21 cm  BP 122/75 mmHg  Pulse 73 /min  Temperature 97.1 F / 36.1 C  BMI 33.5 kg/m   MULTI-SYSTEM PHYSICAL EXAMINATION:    Constitutional: Well-nourished. No physical deformities. Normally developed. Good grooming. No acute distress     Complexity of Data:  Lab Test Review:   BMP  Records Review:   Previous Hospital Records, Previous Patient Records  Urine Test Review:   Urinalysis  X-Ray Review: C.T. Abdomen/Pelvis: Reviewed Films. Reviewed Report. Discussed With Patient.    Notes:                     CLINICAL DATA: Abdominal/flank pain, stone suspected left.   EXAM:  CT ABDOMEN AND PELVIS WITHOUT CONTRAST   TECHNIQUE:  Multidetector CT imaging of the abdomen and pelvis was performed  following the standard protocol without IV contrast.   RADIATION DOSE REDUCTION: This exam was performed according to the  departmental dose-optimization program which includes automated  exposure control, adjustment of the mA and/or kV according to  patient size and/or use of iterative reconstruction technique.   COMPARISON: CT abdomen/pelvis 06/05/2021.   FINDINGS:  Lower chest: No acute abnormality. Coronary artery calcifications.   Hepatobiliary: No focal liver abnormality is seen. Status post  cholecystectomy. No biliary dilatation.   Pancreas: Unremarkable. No pancreatic ductal dilatation or  surrounding inflammatory changes.   Spleen: Normal in size without focal abnormality.   Adrenals/Urinary Tract: Adrenal glands are unremarkable. Likely 2  adjacent stones in the proximal left ureter, measuring up to 6.5 mm  (coronal image 54 series 5) with associated mild  hydroureteronephrosis. Punctate calyceal calculi in the left kidney.  Normal right kidney.  Bladder is unremarkable.   Stomach/Bowel: Postoperative changes of prior sleeve gastrectomy. No  dilated loops of small bowel. Appendix is not visualized. Colon is  unremarkable. No bowel wall thickening or surrounding inflammation.   Vascular/Lymphatic: Aortic atherosclerosis. No enlarged abdominal or  pelvic lymph nodes.   Reproductive: Status post hysterectomy. No adnexal masses.   Other: No abdominal wall hernia or abnormality. No abdominopelvic  ascites.   Musculoskeletal: No acute or significant osseous findings.   IMPRESSION:  1. Likely 2 adjacent stones in the proximal left ureter, measuring  up to 6.5 mm with associated mild hydroureteronephrosis.  2. Punctate calyceal calculi in the left kidney.   Aortic Atherosclerosis (ICD10-I70.0).    Electronically Signed  By: Orvan Falconer M.D.  On: 08/22/2022 14:30   PROCEDURES:          Urinalysis w/Scope Dipstick Dipstick Cont'd Micro  Color: Yellow Bilirubin: Neg mg/dL WBC/hpf: 0 - 5/hpf  Appearance: Clear Ketones: Neg mg/dL RBC/hpf: 0 - 2/hpf  Specific Gravity: 1.010 Blood: Trace ery/uL Bacteria: NS (Not Seen)  pH: 5.5 Protein: Trace mg/dL Cystals: NS (Not Seen)  Glucose: Neg mg/dL Urobilinogen: 0.2 mg/dL Casts: NS (Not Seen)  Nitrites: Neg Trichomonas: Not Present    Leukocyte Esterase: Trace leu/uL Mucous: Present      Epithelial Cells: 6 - 10/hpf      Yeast: NS (Not Seen)      Sperm: Not Present    ASSESSMENT:      ICD-10 Details  1 GU:   Flank Pain - R10.84 Left, Undiagnosed New Problem  2   Ureteral calculus - N20.1 Left, Undiagnosed New Problem   PLAN:            Medications New Meds: Percocet 7.5 mg-325 mg tablet 1 tablet PO Q 4 H PRN   #20  0 Refill(s)  Pharmacy Name:  [facility]  Address:  [address]   [place], [place] [address]  Phone:  [phone]  Fax:  [phone]            Schedule Return Visit/Planned Activity: Next Available Appointment - Schedule Surgery           Document Letter(s):  Created for Patient: Clinical Summary         Notes:   The risks, benefits and alternatives of cystoscopy with LEFT ureteroscopy, laser lithotripsy and ureteral stent placement was discussed the patient. Risks included, but are not limited to: bleeding, urinary tract infection, ureteral injury/avulsion, ureteral stricture formation, retained stone fragments, the possibility that multiple surgeries may be required to treat the stone(s), MI, stroke, PE and the inherent risks of general anesthesia. The patient voices understanding and wishes to proceed.

## 2022-09-02 NOTE — Anesthesia Procedure Notes (Signed)
Procedure Name: LMA Insertion Date/Time: 09/02/2022 8:59 AM  Performed by: Runell Kovich D, CRNAPre-anesthesia Checklist: Patient identified, Emergency Drugs available, Suction available and Patient being monitored Patient Re-evaluated:Patient Re-evaluated prior to induction Oxygen Delivery Method: Circle system utilized Preoxygenation: Pre-oxygenation with 100% oxygen Induction Type: IV induction Ventilation: Mask ventilation without difficulty LMA: LMA inserted LMA Size: 4.0 Tube type: Oral Number of attempts: 1 Placement Confirmation: positive ETCO2 and breath sounds checked- equal and bilateral Tube secured with: Tape Dental Injury: Teeth and Oropharynx as per pre-operative assessment

## 2022-09-03 ENCOUNTER — Encounter (HOSPITAL_BASED_OUTPATIENT_CLINIC_OR_DEPARTMENT_OTHER): Payer: Self-pay | Admitting: Urology

## 2022-09-18 ENCOUNTER — Encounter (INDEPENDENT_AMBULATORY_CARE_PROVIDER_SITE_OTHER): Payer: Self-pay

## 2022-09-24 ENCOUNTER — Telehealth: Payer: Self-pay | Admitting: Family

## 2022-09-24 DIAGNOSIS — R7303 Prediabetes: Secondary | ICD-10-CM

## 2022-09-24 NOTE — Telephone Encounter (Signed)
Patient need lab orders.

## 2022-09-29 NOTE — Telephone Encounter (Signed)
Lab appt scheduled orders are in pt is aware

## 2022-10-07 ENCOUNTER — Other Ambulatory Visit: Payer: 59

## 2022-10-08 ENCOUNTER — Other Ambulatory Visit (INDEPENDENT_AMBULATORY_CARE_PROVIDER_SITE_OTHER): Payer: 59

## 2022-10-08 DIAGNOSIS — R7303 Prediabetes: Secondary | ICD-10-CM

## 2022-10-08 LAB — CBC WITH DIFFERENTIAL/PLATELET
Basophils Absolute: 0 10*3/uL (ref 0.0–0.1)
Basophils Relative: 0.7 % (ref 0.0–3.0)
Eosinophils Absolute: 0.1 10*3/uL (ref 0.0–0.7)
Eosinophils Relative: 1.1 % (ref 0.0–5.0)
HCT: 40.7 % (ref 36.0–46.0)
Hemoglobin: 13 g/dL (ref 12.0–15.0)
Lymphocytes Relative: 26.4 % (ref 12.0–46.0)
Lymphs Abs: 1.4 10*3/uL (ref 0.7–4.0)
MCHC: 32.1 g/dL (ref 30.0–36.0)
MCV: 93.5 fl (ref 78.0–100.0)
Monocytes Absolute: 0.3 10*3/uL (ref 0.1–1.0)
Monocytes Relative: 5.4 % (ref 3.0–12.0)
Neutro Abs: 3.5 10*3/uL (ref 1.4–7.7)
Neutrophils Relative %: 66.4 % (ref 43.0–77.0)
Platelets: 181 10*3/uL (ref 150.0–400.0)
RBC: 4.35 Mil/uL (ref 3.87–5.11)
RDW: 16.7 % — ABNORMAL HIGH (ref 11.5–15.5)
WBC: 5.3 10*3/uL (ref 4.0–10.5)

## 2022-10-08 LAB — COMPREHENSIVE METABOLIC PANEL
ALT: 51 U/L — ABNORMAL HIGH (ref 0–35)
AST: 35 U/L (ref 0–37)
Albumin: 3.7 g/dL (ref 3.5–5.2)
Alkaline Phosphatase: 114 U/L (ref 39–117)
BUN: 16 mg/dL (ref 6–23)
CO2: 27 meq/L (ref 19–32)
Calcium: 8.8 mg/dL (ref 8.4–10.5)
Chloride: 109 meq/L (ref 96–112)
Creatinine, Ser: 0.6 mg/dL (ref 0.40–1.20)
GFR: 96.8 mL/min (ref >60.00)
Glucose, Bld: 108 mg/dL — ABNORMAL HIGH (ref 70–99)
Potassium: 4 meq/L (ref 3.5–5.1)
Sodium: 142 meq/L (ref 135–145)
Total Bilirubin: 0.8 mg/dL (ref 0.2–1.2)
Total Protein: 6.4 g/dL (ref 6.0–8.3)

## 2022-10-08 LAB — HEMOGLOBIN A1C: Hgb A1c MFr Bld: 5.7 % (ref 4.6–6.5)

## 2022-10-08 LAB — TSH: TSH: 0.1 u[IU]/mL — ABNORMAL LOW (ref 0.35–5.50)

## 2022-10-10 ENCOUNTER — Ambulatory Visit: Payer: 59 | Admitting: Family

## 2022-10-15 ENCOUNTER — Encounter: Payer: Self-pay | Admitting: Family

## 2022-10-15 ENCOUNTER — Other Ambulatory Visit: Payer: Self-pay | Admitting: Family

## 2022-10-15 DIAGNOSIS — E039 Hypothyroidism, unspecified: Secondary | ICD-10-CM

## 2022-10-15 MED ORDER — LEVOTHYROXINE SODIUM 125 MCG PO TABS: 125.0000 ug | ORAL_TABLET | Freq: Every day | ORAL | 3 refills | Status: DC

## 2022-10-16 ENCOUNTER — Telehealth: Payer: Self-pay | Admitting: Family

## 2022-10-16 DIAGNOSIS — R7303 Prediabetes: Secondary | ICD-10-CM

## 2022-10-16 NOTE — Telephone Encounter (Signed)
Orders are placed fax sent to [facility] labs attached. Do you need to do new rx for synthroid to be sent in

## 2022-10-16 NOTE — Addendum Note (Signed)
Addended by: [place], Ibraham Levi on: 10/16/2022 02:56 PM   Modules accepted: Orders

## 2022-10-16 NOTE — Telephone Encounter (Addendum)
Patient called to respond to MyChart message;   Yes, she is taking her levothyroxine (SYNTHROID) 125 MCG tablet. Please order the lower dosage as [name] stated in note. Patient has changed pharmacy to [facility] in [place] and remove [facility].  Patient would like recent labs fax to the [facility], patient will call office back with fax number.  Patient called back with fax# for [place] [phone] going

## 2022-10-20 ENCOUNTER — Other Ambulatory Visit: Payer: Self-pay | Admitting: Family

## 2022-10-20 DIAGNOSIS — E039 Hypothyroidism, unspecified: Secondary | ICD-10-CM

## 2022-10-20 MED ORDER — LEVOTHYROXINE SODIUM 125 MCG PO TABS: 125.0000 ug | ORAL_TABLET | Freq: Every day | ORAL | 3 refills | Status: AC

## 2022-10-20 NOTE — Telephone Encounter (Signed)
Spoke to pt and informed her that synthroid has been sent in to the pharmacy

## 2022-11-28 ENCOUNTER — Other Ambulatory Visit (INDEPENDENT_AMBULATORY_CARE_PROVIDER_SITE_OTHER): Payer: 59

## 2022-11-28 DIAGNOSIS — R7303 Prediabetes: Secondary | ICD-10-CM

## 2022-11-28 NOTE — Addendum Note (Signed)
Addended by: Jarvis Morgan D on: 11/28/2022 01:52 PM   Modules accepted: Orders

## 2022-11-29 LAB — TSH: TSH: 0.603 u[IU]/mL (ref 0.450–4.500)

## 2022-12-11 ENCOUNTER — Encounter: Payer: Self-pay | Admitting: Family

## 2022-12-12 ENCOUNTER — Other Ambulatory Visit: Payer: Self-pay

## 2022-12-12 DIAGNOSIS — F419 Anxiety disorder, unspecified: Secondary | ICD-10-CM

## 2022-12-12 MED ORDER — DULOXETINE HCL 60 MG PO CPEP: 60.0000 mg | ORAL_CAPSULE | Freq: Every day | ORAL | 1 refills | Status: DC

## 2022-12-12 NOTE — Telephone Encounter (Signed)
Rx sent in to [facility] pharmacy

## 2023-05-04 ENCOUNTER — Encounter: Payer: Self-pay | Admitting: Family

## 2023-05-04 LAB — HM MAMMOGRAPHY

## 2023-05-07 ENCOUNTER — Encounter: Payer: Self-pay | Admitting: *Deleted

## 2023-07-17 ENCOUNTER — Ambulatory Visit
Admission: EM | Admit: 2023-07-17 | Discharge: 2023-07-17 | Disposition: A | Discharge status: Home / Self Care | Attending: Emergency Medicine | Admitting: Emergency Medicine

## 2023-07-17 ENCOUNTER — Encounter: Payer: Self-pay | Admitting: Emergency Medicine

## 2023-07-17 DIAGNOSIS — B349 Viral infection, unspecified: Secondary | ICD-10-CM | POA: Diagnosis not present

## 2023-07-17 LAB — POC SARS CORONAVIRUS 2 AG -  ED: SARS Coronavirus 2 Ag: NEGATIVE

## 2023-07-17 LAB — POCT RAPID STREP A (OFFICE): Rapid Strep A Screen: NEGATIVE

## 2023-07-17 NOTE — Discharge Instructions (Addendum)
 The COVID and strep tests are negative.   Take Tylenol  or ibuprofen  as needed for fever or discomfort.  Take plain Mucinex as needed for congestion.  Rest and keep yourself hydrated.    Follow-up with your primary care provider if your symptoms are not improving.

## 2023-07-17 NOTE — ED Provider Notes (Signed)
 Arlander Bellman    CSN: [id number] Arrival date & time: 07/17/23  1532      History   Chief Complaint Chief Complaint  Patient presents with   Diarrhea   Fever   Chills    HPI Helen Freeman is a 63 y.o. female.  Patient presents with 3-day history of subjective fever, chills, body aches, diarrhea.  She also has 1 day history of rash on her trunk and extremities.  The rash is not tender and not pruritic.  She had 1 episode of emesis yesterday but none today.  3 episodes of diarrhea today.  She has been drinking fluids well and maintaining her hydration at home.  She had a sore throat earlier but this has improved.  She took Excedrin at 10:00 this morning.  She denies cough, shortness of breath, abdominal pain, blood in vomit, blood in stool, dysuria.  No recent travel out of the country or antibiotic use.  The history is provided by the patient and medical records.    Past Medical History:  Diagnosis Date   Anxiety    Arthritis    Chronic gastritis    egd 04/18/16 + chronic gastritis and EGD 06/16/16 neg H pylori    Depression    Discoid lupus    causes scaly plaques on scalp face and ears   Eczema    Fibroids    Fibromyalgia    Gallstones    11/25/17 US    Hashimoto's disease    History of kidney stones    Hyperlipemia    Hypothyroidism    Lateral epicondylitis    tennis elbow   Migraines    Morphea    localized scleroderma   Plantar fasciitis    PLE (polymorphic light eruption)    sunlight  exposure in spring or summer causes skin rash   Prediabetes    Right lateral epicondylitis    Sleep apnea    Urine incontinence     Patient Active Problem List   Diagnosis Date Noted   Cyst of right kidney 06/06/2021   Kidney stones 06/06/2021   Aortic atherosclerosis (HCC) 06/06/2021   Fatty liver 04/29/2021   Hiatal hernia 04/10/2021   Migraine without status migrainosus, not intractable 09/05/2020   Iron deficiency 10/16/2019   Annual physical exam  11/19/2018   Insomnia 11/19/2018   Allergic rhinitis 05/11/2018   Morphea 05/11/2018   Psoriasis 05/11/2018   Other chronic pain 12/10/2017   Hypothyroidism 12/10/2017   Chronic gastritis without bleeding 12/10/2017   PLE (polymorphic light eruption) 12/10/2017   Discoid lupus 12/10/2017   Eczema 12/10/2017   Fibromyalgia 12/10/2017   Anxiety and depression 12/10/2017   Prediabetes 12/10/2017   History of kidney stones 12/10/2017   S/P gastric surgery 12/10/2017   Obstructive sleep apnea syndrome 03/27/2016   Obesity (BMI 30-39.9) 03/11/2016    Past Surgical History:  Procedure Laterality Date   ABDOMINAL HYSTERECTOMY     uterus and both ovaries out per pt cervix intact h/o PID in 1986, scar tissue, hemorrhagic cysts; multiple pelvic surgeries 1986 to 2006    APPENDECTOMY     yrs ago   cataract surgery Bilateral 06/2022   cervical radiofrequency     in [place]    CYSTOSCOPY/URETEROSCOPY/HOLMIUM LASER/STENT PLACEMENT Left 09/02/2022   Procedure: CYSTOSCOPY, LEFT URETEROSCOPY, RETROGRADE PYELOGRAM;  HOLMIUM LASER LITHOTRIPSY, AND LEFT URETERAL STENT PLACEMENT;  Surgeon: Adelbert Homans, MD;  Location: [facility];  Service: Urology;  Laterality: Left;   ESOPHAGOGASTRODUODENOSCOPY  02/27/20 s/p sleeve gastrectomy, diaphragmatic hiatal hernia 3 cm nl esophagus and duodenum 2nd part   EXTRACORPOREAL SHOCK WAVE LITHOTRIPSY Left 12/20/2015   Procedure: EXTRACORPOREAL SHOCK WAVE LITHOTRIPSY (ESWL);  Surgeon: Bart Born, MD;  Location: [facility];  Service: Urology;  Laterality: Left;   GALLBLADDER SURGERY     02/2018 Dr. Therese Flash   HIATAL HERNIA REPAIR     05/2019 Dr. Merrily Able with complications hematoma   LAPAROSCOPIC GASTRIC SLEEVE RESECTION     06/2016 Dr. Therese Flash    LAPAROSCOPIC TOTAL HYSTERECTOMY  2004   multiple pelvic surgeries     1980's   OTHER SURGICAL HISTORY     ROTATOR CUFF REPAIR Right    02/2004    OB History   No obstetric history on  file.      Home Medications    Prior to Admission medications   Medication Sig Start Date End Date Taking? Authorizing Provider  atorvastatin  (LIPITOR) 20 MG tablet Take 1 tablet (20 mg total) by mouth daily. Patient taking differently: Take 20 mg by mouth every evening. 10/11/21   McLean-Scocuzza, Karon Packer, MD  azelastine  (ASTELIN ) 0.1 % nasal spray Place 1 spray into both nostrils 2 (two) times daily. Use in each nostril as directed 02/28/22   Kandice Orleans, MD  Carboxymethylcellulose Sodium 0.25 % SOLN Administer 2 drops to both eyes daily as needed.    [provider]  cetirizine  (ZYRTEC ) 10 MG tablet TAKE 1 TABLET BY MOUTH DAILY 09/01/22   [name], FNP  cyclobenzaprine  (FLEXERIL ) 5 MG tablet Take 1 tablet (5 mg total) by mouth 3 (three) times daily as needed for muscle spasms. 01/17/22   [name], FNP  DULoxetine  (CYMBALTA ) 60 MG capsule Take 1 capsule (60 mg total) by mouth daily. 12/12/22   [name], FNP  Eszopiclone  3 MG TABS Take 1 tablet (3 mg total) by mouth at bedtime as needed. Take immediately before bedtime Patient taking differently: Take 3 mg by mouth at bedtime as needed. Take immediately before bedtime, lunesta  10/11/21   McLean-Scocuzza, Karon Packer, MD  fluticasone  (FLONASE ) 50 MCG/ACT nasal spray Place 2 sprays into both nostrils daily as needed for allergies or rhinitis. fluticasone  propionate 50 mcg/actuation nasal spray,suspension 10/11/21   McLean-Scocuzza, Karon Packer, MD  folic acid  (FOLVITE ) 1 MG tablet Take 1 mg by mouth daily. 04/11/19   [provider]  gabapentin  (NEURONTIN ) 100 MG capsule Take 1 capsule (100 mg total) by mouth at bedtime. 10/16/21   McLean-Scocuzza, Karon Packer, MD  hydrocortisone  2.5 % cream Apply topically 2 (two) times daily. Prn neck 11/08/19   McLean-Scocuzza, Karon Packer, MD  ipratropium (ATROVENT ) 0.06 % nasal spray Place 2 sprays into both nostrils 4 (four) times daily. Patient taking differently: Place 2 sprays  into both nostrils as needed. 10/11/21   McLean-Scocuzza, Karon Packer, MD  ketorolac  (TORADOL ) 10 MG tablet Take 1 tablet (10 mg total) by mouth every 6 (six) hours as needed. 08/22/22   [place] Butter, PA  levothyroxine  (SYNTHROID ) 125 MCG tablet Take 1 tablet (125 mcg total) by mouth daily. 10/20/22   [name], FNP  metFORMIN  (GLUCOPHAGE -XR) 500 MG 24 hr tablet TAKE FOUR TABLETS BY MOUTH EVERY EVENING Patient taking differently: 2 (two) times daily with a meal. 08/08/22   [name], FNP  Methotrexate 2.5 MG/ML SOLN 15 mg once a week. 03/14/19   [provider]  ondansetron  (ZOFRAN -ODT) 4 MG disintegrating tablet Take 1 tablet (4 mg total)  by mouth every 8 (eight) hours as needed for nausea or vomiting. 08/22/22   [place] Butter, PA  oxybutynin  (DITROPAN ) 5 MG tablet Take 1 tablet (5 mg total) by mouth every 8 (eight) hours as needed for bladder spasms. 09/02/22   Adelbert Homans, MD  oxyCODONE  (ROXICODONE ) 5 MG immediate release tablet Take 1 tablet (5 mg total) by mouth every 6 (six) hours as needed for severe pain or breakthrough pain. 08/22/22   [place] Butter, PA  oxyCODONE -acetaminophen  (PERCOCET) 7.5-325 MG tablet Take 1 tablet by mouth every 4 (four) hours as needed for severe pain. 09/02/22 09/02/23  Adelbert Homans, MD  phenazopyridine  (PYRIDIUM ) 200 MG tablet Take 1 tablet (200 mg total) by mouth 3 (three) times daily as needed (for pain with urination). 09/02/22 09/02/23  Adelbert Homans, MD  rizatriptan  (MAXALT -MLT) 10 MG disintegrating tablet Take 1 tablet (10 mg total) by mouth as needed for migraine. May repeat in 2 hours if needed. No more than 2x per week. No more than 30 mg in 24 hrs 10/11/21   McLean-Scocuzza, Karon Packer, MD  sodium chloride  (OCEAN) 0.65 % SOLN nasal spray Place 2 sprays into both nostrils as needed for congestion. 10/11/21   McLean-Scocuzza, Karon Packer, MD  tamsulosin  (FLOMAX ) 0.4 MG CAPS capsule Take 1 capsule (0.4 mg total)  by mouth daily as needed (Left flank pain). 08/22/22   [place] Butter, PA  topiramate  (TOPAMAX ) 25 MG tablet Take 1 tablet (25 mg total) by mouth 2 (two) times daily. 10/11/21   McLean-Scocuzza, Karon Packer, MD  traZODone  (DESYREL ) 50 MG tablet Take 1 tablet (50 mg total) by mouth at bedtime. Sleep, depression 01/17/22   [name], FNP    Family History Family History  Problem Relation Age of Onset   Allergic rhinitis Mother    Arthritis Mother    Hyperlipidemia Mother    Hypertension Mother    Heart disease Father    Early death Father    Kidney cancer Maternal Grandmother    Hypertension Maternal Grandmother    Asthma Son    Asthma Maternal Aunt    Colon cancer Paternal Uncle    Prostate cancer Neg Hx     Social History Social History   Tobacco Use   Smoking status: Former    Passive exposure: Never   Smokeless tobacco: Never   Tobacco comments:    Quit in 2012  Vaping Use   Vaping status: Never Used  Substance Use Topics   Alcohol use: Never   Drug use: No     Allergies   Plaquenil  [hydroxychloroquine sulfate], Lactose intolerance (gi), Other, and Plaquenil [hydroxychloroquine]   Review of Systems Review of Systems  Constitutional:  Positive for chills and fever.  HENT:  Positive for sore throat. Negative for ear pain.   Respiratory:  Negative for cough and shortness of breath.   Gastrointestinal:  Positive for diarrhea and vomiting. Negative for abdominal pain, blood in stool and constipation.  Genitourinary:  Negative for dysuria and hematuria.  Skin:  Positive for color change and rash.     Physical Exam Triage Vital Signs ED Triage Vitals  Encounter Vitals Group     BP      Girls Systolic BP Percentile      Girls Diastolic BP Percentile      Boys Systolic BP Percentile      Boys Diastolic BP Percentile      Pulse      Resp  Temp      Temp src      SpO2      Weight      Height      Head Circumference      Peak Flow      Pain  Score      Pain Loc      Pain Education      Exclude from Growth Chart    No data found.  Updated Vital Signs BP 116/71   Pulse 92   Temp 98.1 F (36.7 C)   Resp 18   SpO2 96%   Visual Acuity Right Eye Distance:   Left Eye Distance:   Bilateral Distance:    Right Eye Near:   Left Eye Near:    Bilateral Near:     Physical Exam Constitutional:      General: She is not in acute distress. HENT:     Right Ear: Tympanic membrane normal.     Left Ear: Tympanic membrane normal.     Nose: Nose normal.     Mouth/Throat:     Mouth: Mucous membranes are moist.     Pharynx: Oropharynx is clear.   Cardiovascular:     Rate and Rhythm: Normal rate and regular rhythm.     Heart sounds: Normal heart sounds.  Pulmonary:     Effort: Pulmonary effort is normal. No respiratory distress.     Breath sounds: Normal breath sounds.  Abdominal:     Palpations: Abdomen is soft.     Tenderness: There is no abdominal tenderness. There is no guarding or rebound.   Skin:    General: Skin is warm and dry.     Findings: Rash present.     Comments: Blanchable pink maculopapular rash on trunk and extremities.   Neurological:     Mental Status: She is alert.      UC Treatments / Results  Labs (all labs ordered are listed, but only abnormal results are displayed) Labs Reviewed  POC SARS CORONAVIRUS 2 AG -  ED  POCT RAPID STREP A (OFFICE)    EKG   Radiology No results found.  Procedures Procedures (including critical care time)  Medications Ordered in UC Medications - No data to display  Initial Impression / Assessment and Plan / UC Course  I have reviewed the triage vital signs and the nursing notes.  Pertinent labs & imaging results that were available during my care of the patient were reviewed by me and considered in my medical decision making (see chart for details).    Viral illness.  Rapid COVID and strep negative.  Discussed symptomatic treatment including Tylenol  or  ibuprofen as needed for fever or discomfort, plain Mucinex as needed for congestion, rest, hydration.  Instructed patient to follow-up with PCP if not improving.  ED precautions given.  Patient agrees to plan of care.    Final Clinical Impressions(s) / UC Diagnoses   Final diagnoses:  Viral illness     Discharge Instructions      The COVID and strep tests are negative.   Take Tylenol  or ibuprofen as needed for fever or discomfort.  Take plain Mucinex as needed for congestion.  Rest and keep yourself hydrated.    Follow-up with your primary care provider if your symptoms are not improving.         ED Prescriptions   None    PDMP not reviewed this encounter.   Wellington Half, NP 07/17/23 [phone]

## 2023-07-17 NOTE — ED Triage Notes (Signed)
 Patient fever, body aches diarrhea and chills x 3 days. Also rash all over body that started yesterday.

## 2023-07-20 ENCOUNTER — Emergency Department

## 2023-07-20 ENCOUNTER — Inpatient Hospital Stay
Admission: EM | Admit: 2023-07-20 | Discharge: 2023-07-23 | DRG: 872 | Disposition: A | Discharge status: Home / Self Care | Attending: Student | Admitting: Student

## 2023-07-20 ENCOUNTER — Encounter: Payer: Self-pay | Admitting: Emergency Medicine

## 2023-07-20 ENCOUNTER — Other Ambulatory Visit: Payer: Self-pay

## 2023-07-20 DIAGNOSIS — E876 Hypokalemia: Secondary | ICD-10-CM | POA: Diagnosis present

## 2023-07-20 DIAGNOSIS — Z8051 Family history of malignant neoplasm of kidney: Secondary | ICD-10-CM

## 2023-07-20 DIAGNOSIS — Z91011 Allergy to milk products: Secondary | ICD-10-CM | POA: Diagnosis not present

## 2023-07-20 DIAGNOSIS — A419 Sepsis, unspecified organism: Secondary | ICD-10-CM | POA: Diagnosis not present

## 2023-07-20 DIAGNOSIS — Z825 Family history of asthma and other chronic lower respiratory diseases: Secondary | ICD-10-CM | POA: Diagnosis not present

## 2023-07-20 DIAGNOSIS — R748 Abnormal levels of other serum enzymes: Secondary | ICD-10-CM | POA: Diagnosis present

## 2023-07-20 DIAGNOSIS — E039 Hypothyroidism, unspecified: Secondary | ICD-10-CM | POA: Diagnosis present

## 2023-07-20 DIAGNOSIS — F419 Anxiety disorder, unspecified: Secondary | ICD-10-CM | POA: Diagnosis present

## 2023-07-20 DIAGNOSIS — L930 Discoid lupus erythematosus: Secondary | ICD-10-CM | POA: Diagnosis present

## 2023-07-20 DIAGNOSIS — Z8249 Family history of ischemic heart disease and other diseases of the circulatory system: Secondary | ICD-10-CM

## 2023-07-20 DIAGNOSIS — R7401 Elevation of levels of liver transaminase levels: Secondary | ICD-10-CM | POA: Diagnosis present

## 2023-07-20 DIAGNOSIS — Z888 Allergy status to other drugs, medicaments and biological substances status: Secondary | ICD-10-CM | POA: Diagnosis not present

## 2023-07-20 DIAGNOSIS — E1165 Type 2 diabetes mellitus with hyperglycemia: Secondary | ICD-10-CM | POA: Diagnosis present

## 2023-07-20 DIAGNOSIS — I10 Essential (primary) hypertension: Secondary | ICD-10-CM | POA: Diagnosis present

## 2023-07-20 DIAGNOSIS — Z87891 Personal history of nicotine dependence: Secondary | ICD-10-CM | POA: Diagnosis not present

## 2023-07-20 DIAGNOSIS — E66811 Obesity, class 1: Secondary | ICD-10-CM | POA: Diagnosis present

## 2023-07-20 DIAGNOSIS — Z9884 Bariatric surgery status: Secondary | ICD-10-CM | POA: Diagnosis not present

## 2023-07-20 DIAGNOSIS — A09 Infectious gastroenteritis and colitis, unspecified: Secondary | ICD-10-CM | POA: Diagnosis present

## 2023-07-20 DIAGNOSIS — D84821 Immunodeficiency due to drugs: Secondary | ICD-10-CM | POA: Diagnosis present

## 2023-07-20 DIAGNOSIS — R21 Rash and other nonspecific skin eruption: Secondary | ICD-10-CM | POA: Diagnosis present

## 2023-07-20 DIAGNOSIS — Z83438 Family history of other disorder of lipoprotein metabolism and other lipidemia: Secondary | ICD-10-CM | POA: Diagnosis not present

## 2023-07-20 DIAGNOSIS — Z7984 Long term (current) use of oral hypoglycemic drugs: Secondary | ICD-10-CM | POA: Diagnosis not present

## 2023-07-20 DIAGNOSIS — R238 Other skin changes: Secondary | ICD-10-CM | POA: Diagnosis present

## 2023-07-20 DIAGNOSIS — R5381 Other malaise: Secondary | ICD-10-CM | POA: Diagnosis present

## 2023-07-20 DIAGNOSIS — E119 Type 2 diabetes mellitus without complications: Secondary | ICD-10-CM

## 2023-07-20 DIAGNOSIS — Z6831 Body mass index (BMI) 31.0-31.9, adult: Secondary | ICD-10-CM | POA: Diagnosis not present

## 2023-07-20 DIAGNOSIS — I9589 Other hypotension: Secondary | ICD-10-CM | POA: Diagnosis present

## 2023-07-20 DIAGNOSIS — Z800 Family history of malignant neoplasm of digestive organs: Secondary | ICD-10-CM

## 2023-07-20 DIAGNOSIS — Z79890 Hormone replacement therapy (postmenopausal): Secondary | ICD-10-CM

## 2023-07-20 DIAGNOSIS — R61 Generalized hyperhidrosis: Secondary | ICD-10-CM | POA: Diagnosis present

## 2023-07-20 DIAGNOSIS — R531 Weakness: Secondary | ICD-10-CM | POA: Diagnosis present

## 2023-07-20 DIAGNOSIS — G894 Chronic pain syndrome: Secondary | ICD-10-CM | POA: Diagnosis present

## 2023-07-20 DIAGNOSIS — Z79899 Other long term (current) drug therapy: Secondary | ICD-10-CM

## 2023-07-20 DIAGNOSIS — T451X5A Adverse effect of antineoplastic and immunosuppressive drugs, initial encounter: Secondary | ICD-10-CM | POA: Diagnosis present

## 2023-07-20 DIAGNOSIS — Z8261 Family history of arthritis: Secondary | ICD-10-CM | POA: Diagnosis not present

## 2023-07-20 LAB — URINALYSIS, ROUTINE W REFLEX MICROSCOPIC
Bacteria, UA: NONE SEEN
Bilirubin Urine: NEGATIVE
Glucose, UA: NEGATIVE mg/dL
Hgb urine dipstick: NEGATIVE
Ketones, ur: NEGATIVE mg/dL
Nitrite: NEGATIVE
Protein, ur: 100 mg/dL — AB
Specific Gravity, Urine: 1.019 (ref 1.005–1.030)
pH: 5 (ref 5.0–8.0)

## 2023-07-20 LAB — CBG MONITORING, ED: Glucose-Capillary: 177 mg/dL — ABNORMAL HIGH (ref 70–99)

## 2023-07-20 LAB — CBC
HCT: 43.4 % (ref 36.0–46.0)
Hemoglobin: 15.2 g/dL — ABNORMAL HIGH (ref 12.0–15.0)
MCH: 30.5 pg (ref 26.0–34.0)
MCHC: 35 g/dL (ref 30.0–36.0)
MCV: 87.1 fL (ref 80.0–100.0)
Platelets: 175 10*3/uL (ref 150–400)
RBC: 4.98 MIL/uL (ref 3.87–5.11)
RDW: 14.4 % (ref 11.5–15.5)
WBC: 15.3 10*3/uL — ABNORMAL HIGH (ref 4.0–10.5)
nRBC: 0 % (ref 0.0–0.2)

## 2023-07-20 LAB — COMPREHENSIVE METABOLIC PANEL WITH GFR
ALT: 79 U/L — ABNORMAL HIGH (ref 0–44)
AST: 75 U/L — ABNORMAL HIGH (ref 15–41)
Albumin: 2.9 g/dL — ABNORMAL LOW (ref 3.5–5.0)
Alkaline Phosphatase: 144 U/L — ABNORMAL HIGH (ref 38–126)
Anion gap: 10 (ref 5–15)
BUN: 15 mg/dL (ref 8–23)
CO2: 19 mmol/L — ABNORMAL LOW (ref 22–32)
Calcium: 8.2 mg/dL — ABNORMAL LOW (ref 8.9–10.3)
Chloride: 101 mmol/L (ref 98–111)
Creatinine, Ser: 0.8 mg/dL (ref 0.44–1.00)
GFR, Estimated: >60 mL/min (ref >60)
Glucose, Bld: 148 mg/dL — ABNORMAL HIGH (ref 70–99)
Potassium: 3.5 mmol/L (ref 3.5–5.1)
Sodium: 130 mmol/L — ABNORMAL LOW (ref 135–145)
Total Bilirubin: 1.2 mg/dL (ref 0.0–1.2)
Total Protein: 7 g/dL (ref 6.5–8.1)

## 2023-07-20 LAB — PROCALCITONIN: Procalcitonin: 4.44 ng/mL

## 2023-07-20 LAB — HEMOGLOBIN A1C
Hgb A1c MFr Bld: 6.7 % — ABNORMAL HIGH (ref 4.8–5.6)
Mean Plasma Glucose: 145.59 mg/dL

## 2023-07-20 LAB — LIPASE, BLOOD: Lipase: 89 U/L — ABNORMAL HIGH (ref 11–51)

## 2023-07-20 LAB — LACTIC ACID, PLASMA: Lactic Acid, Venous: 1.9 mmol/L (ref 0.5–1.9)

## 2023-07-20 MED ORDER — ONDANSETRON HCL 4 MG PO TABS
4.0000 mg | ORAL_TABLET | Freq: Four times a day (QID) | ORAL | Status: DC | PRN
Start: 2023-07-20 — End: 2023-07-23

## 2023-07-20 MED ORDER — TRAZODONE HCL 50 MG PO TABS
50.0000 mg | ORAL_TABLET | Freq: Every day | ORAL | Status: DC
  Administered 2023-07-20 – 2023-07-22 (×3): 50 mg via ORAL
  Filled 2023-07-20 (×3): qty 1

## 2023-07-20 MED ORDER — LORATADINE 10 MG PO TABS
10.0000 mg | ORAL_TABLET | Freq: Every day | ORAL | Status: DC
  Administered 2023-07-21 – 2023-07-23 (×3): 10 mg via ORAL
  Filled 2023-07-20 (×3): qty 1

## 2023-07-20 MED ORDER — ACETAMINOPHEN 650 MG RE SUPP: 650.0000 mg | Freq: Four times a day (QID) | RECTAL | Status: DC | PRN

## 2023-07-20 MED ORDER — ZOLPIDEM TARTRATE 5 MG PO TABS: 5.0000 mg | ORAL_TABLET | Freq: Every evening | ORAL | Status: DC | PRN

## 2023-07-20 MED ORDER — LEVOTHYROXINE SODIUM 50 MCG PO TABS
125.0000 ug | ORAL_TABLET | Freq: Every day | ORAL | Status: DC
  Administered 2023-07-21 – 2023-07-23 (×3): 125 ug via ORAL
  Filled 2023-07-20 (×3): qty 1

## 2023-07-20 MED ORDER — TAMSULOSIN HCL 0.4 MG PO CAPS: 0.4000 mg | ORAL_CAPSULE | Freq: Every day | ORAL | Status: DC | PRN

## 2023-07-20 MED ORDER — FLUOXETINE HCL 20 MG PO CAPS
20.0000 mg | ORAL_CAPSULE | Freq: Every day | ORAL | Status: DC
  Administered 2023-07-21 – 2023-07-23 (×3): 20 mg via ORAL
  Filled 2023-07-20 (×3): qty 1

## 2023-07-20 MED ORDER — ONDANSETRON HCL 4 MG/2ML IJ SOLN: 4.0000 mg | Freq: Four times a day (QID) | INTRAMUSCULAR | Status: DC | PRN

## 2023-07-20 MED ORDER — ACETAMINOPHEN 325 MG PO TABS
650.0000 mg | ORAL_TABLET | Freq: Four times a day (QID) | ORAL | Status: DC | PRN
  Administered 2023-07-20 – 2023-07-21 (×2): 650 mg via ORAL
  Filled 2023-07-20 (×2): qty 2

## 2023-07-20 MED ORDER — VANCOMYCIN HCL 750 MG/150ML IV SOLN
750.0000 mg | Freq: Once | INTRAVENOUS | Status: AC
  Administered 2023-07-20: 750 mg via INTRAVENOUS
  Filled 2023-07-20: qty 150

## 2023-07-20 MED ORDER — SODIUM CHLORIDE 0.9 % IV SOLN
2.0000 g | Freq: Once | INTRAVENOUS | Status: AC
  Administered 2023-07-20: 2 g via INTRAVENOUS
  Filled 2023-07-20: qty 12.5

## 2023-07-20 MED ORDER — PREDNISOLONE ACETATE 1 % OP SUSP
1.0000 [drp] | Freq: Four times a day (QID) | OPHTHALMIC | Status: DC
  Administered 2023-07-21 – 2023-07-22 (×2): 1 [drp] via OPHTHALMIC
  Filled 2023-07-20: qty 1
  Filled 2023-07-20: qty 5

## 2023-07-20 MED ORDER — TOPIRAMATE 25 MG PO TABS
25.0000 mg | ORAL_TABLET | Freq: Two times a day (BID) | ORAL | Status: DC
  Administered 2023-07-21 – 2023-07-23 (×5): 25 mg via ORAL
  Filled 2023-07-20 (×6): qty 1

## 2023-07-20 MED ORDER — ENOXAPARIN SODIUM 40 MG/0.4ML IJ SOSY
40.0000 mg | PREFILLED_SYRINGE | INTRAMUSCULAR | Status: DC
  Administered 2023-07-20 – 2023-07-22 (×3): 40 mg via SUBCUTANEOUS
  Filled 2023-07-20 (×3): qty 0.4

## 2023-07-20 MED ORDER — HYDROCORTISONE (PERIANAL) 2.5 % EX CREA: 1.0000 | TOPICAL_CREAM | Freq: Two times a day (BID) | CUTANEOUS | Status: DC | PRN

## 2023-07-20 MED ORDER — GABAPENTIN 100 MG PO CAPS
100.0000 mg | ORAL_CAPSULE | Freq: Two times a day (BID) | ORAL | Status: DC
  Administered 2023-07-20 – 2023-07-23 (×6): 100 mg via ORAL
  Filled 2023-07-20 (×6): qty 1

## 2023-07-20 MED ORDER — LACTATED RINGERS IV BOLUS
1000.0000 mL | Freq: Once | INTRAVENOUS | Status: AC
  Administered 2023-07-20: 1000 mL via INTRAVENOUS

## 2023-07-20 MED ORDER — DOXYCYCLINE HYCLATE 100 MG PO TABS
100.0000 mg | ORAL_TABLET | Freq: Two times a day (BID) | ORAL | Status: DC
  Administered 2023-07-20 – 2023-07-23 (×6): 100 mg via ORAL
  Filled 2023-07-20 (×6): qty 1

## 2023-07-20 MED ORDER — OXYBUTYNIN CHLORIDE 5 MG PO TABS: 5.0000 mg | ORAL_TABLET | Freq: Three times a day (TID) | ORAL | Status: DC | PRN

## 2023-07-20 MED ORDER — AZELASTINE HCL 0.1 % NA SOLN: 1.0000 | Freq: Two times a day (BID) | NASAL | Status: DC | PRN

## 2023-07-20 MED ORDER — PANTOPRAZOLE SODIUM 40 MG PO TBEC
40.0000 mg | DELAYED_RELEASE_TABLET | Freq: Every day | ORAL | Status: DC
  Administered 2023-07-21 – 2023-07-23 (×3): 40 mg via ORAL
  Filled 2023-07-20 (×3): qty 1

## 2023-07-20 MED ORDER — RISAQUAD PO CAPS
1.0000 | ORAL_CAPSULE | Freq: Three times a day (TID) | ORAL | Status: DC
  Administered 2023-07-21 – 2023-07-22 (×4): 1 via ORAL
  Filled 2023-07-20 (×5): qty 1

## 2023-07-20 MED ORDER — INSULIN ASPART 100 UNIT/ML IJ SOLN
0.0000 [IU] | Freq: Three times a day (TID) | INTRAMUSCULAR | Status: DC
  Administered 2023-07-21 (×2): 2 [IU] via SUBCUTANEOUS
  Administered 2023-07-22: 3 [IU] via SUBCUTANEOUS
  Administered 2023-07-22: 1 [IU] via SUBCUTANEOUS
  Administered 2023-07-22 – 2023-07-23 (×2): 2 [IU] via SUBCUTANEOUS
  Filled 2023-07-20 (×7): qty 1

## 2023-07-20 MED ORDER — VANCOMYCIN HCL IN DEXTROSE 1-5 GM/200ML-% IV SOLN
1000.0000 mg | Freq: Once | INTRAVENOUS | Status: AC
  Administered 2023-07-20: 1000 mg via INTRAVENOUS
  Filled 2023-07-20: qty 200

## 2023-07-20 MED ORDER — ELETRIPTAN HYDROBROMIDE 40 MG PO TABS: 40.0000 mg | ORAL_TABLET | ORAL | Status: DC | PRN

## 2023-07-20 MED ORDER — PHENAZOPYRIDINE HCL 200 MG PO TABS: 200.0000 mg | ORAL_TABLET | Freq: Three times a day (TID) | ORAL | Status: DC | PRN

## 2023-07-20 MED ORDER — IPRATROPIUM BROMIDE 0.06 % NA SOLN: 2.0000 | Freq: Four times a day (QID) | NASAL | Status: DC | PRN

## 2023-07-20 MED ORDER — CYCLOBENZAPRINE HCL 10 MG PO TABS
5.0000 mg | ORAL_TABLET | Freq: Three times a day (TID) | ORAL | Status: DC | PRN
  Administered 2023-07-22: 5 mg via ORAL
  Filled 2023-07-20: qty 1

## 2023-07-20 MED ORDER — METRONIDAZOLE 500 MG/100ML IV SOLN
500.0000 mg | Freq: Once | INTRAVENOUS | Status: AC
  Administered 2023-07-20: 500 mg via INTRAVENOUS
  Filled 2023-07-20: qty 100

## 2023-07-20 MED ORDER — FLUTICASONE PROPIONATE 50 MCG/ACT NA SUSP: 2.0000 | Freq: Every day | NASAL | Status: DC | PRN

## 2023-07-20 MED ORDER — OXYCODONE HCL 5 MG PO TABS
5.0000 mg | ORAL_TABLET | Freq: Four times a day (QID) | ORAL | Status: DC | PRN
  Administered 2023-07-21 – 2023-07-22 (×3): 5 mg via ORAL
  Filled 2023-07-20 (×3): qty 1

## 2023-07-20 MED ORDER — DULOXETINE HCL 60 MG PO CPEP: 60.0000 mg | ORAL_CAPSULE | Freq: Every day | ORAL | Status: DC

## 2023-07-20 MED ORDER — POLYVINYL ALCOHOL 1.4 % OP SOLN
1.0000 [drp] | Freq: Four times a day (QID) | OPHTHALMIC | Status: DC | PRN
  Filled 2023-07-20: qty 15

## 2023-07-20 NOTE — ED Notes (Signed)
Hospitalist was at bedside.

## 2023-07-20 NOTE — ED Provider Notes (Signed)
 [facility] Provider Note    Event Date/Time   First MD Initiated Contact with Patient 07/20/23 1423     (approximate)   History   Chief Complaint Fatigue   HPI  Nyrah Demos is a 63 y.o. female with past medical history of hypothyroidism, migraines, discoid lupus, and chronic pain syndrome who presents to the ED complaining of fatigue.  Patient reports that she has been feeling generally ill and fatigued for the past 5 or 6 days.  She describes subjective fevers, has not checked her temperature at home, but reports feeling hot with chills.  She has had a posterior headache, nausea, diarrhea, and a diffuse rash.  She denies any cough, chest pain, shortness of breath, dysuria, or flank pain.  She describes the rash as small red dots over her entire body that are neither itchy nor painful.  She has not noticed any skin sloughing or drainage from these areas.  She does describe diffuse joint pains, but has not noticed any redness or swelling around any joints.  She is not aware of any sick contacts.  She initially presented to the [facility] walk-in clinic, found to be febrile and tachycardic, subsequently referred to the ED.     Physical Exam   Triage Vital Signs: ED Triage Vitals  Encounter Vitals Group     BP 07/20/23 1023 103/75     Girls Systolic BP Percentile --      Girls Diastolic BP Percentile --      Boys Systolic BP Percentile --      Boys Diastolic BP Percentile --      Pulse Rate 07/20/23 1023 (!) 107     Resp 07/20/23 1023 20     Temp 07/20/23 1023 99.1 F (37.3 C)     Temp Source 07/20/23 1023 Oral     SpO2 07/20/23 1023 97 %     Weight 07/20/23 1020 170 lb (77.1 kg)     Height 07/20/23 1020 5' 2 (1.575 m)     Head Circumference --      Peak Flow --      Pain Score 07/20/23 1020 0     Pain Loc --      Pain Education --      Exclude from Growth Chart --     Most recent vital signs: Vitals:   07/20/23 1415 07/20/23 1429  BP: (!) 139/92    Pulse: 72   Resp: 18   Temp:  97.6 F (36.4 C)  SpO2: 100%     Constitutional: Alert and oriented. Eyes: Conjunctivae are normal. Head: Atraumatic. Nose: No congestion/rhinnorhea. Mouth/Throat: Mucous membranes are moist.  Neck: Supple with no meningismus. Cardiovascular: Normal rate, regular rhythm. Grossly normal heart sounds.  2+ radial pulses bilaterally. Respiratory: Normal respiratory effort.  No retractions. Lungs CTAB. Gastrointestinal: Soft and nontender. No distention. Musculoskeletal: No lower extremity tenderness nor edema.  Diffuse maculopapular rash without skin sloughing or purulence. Neurologic:  Normal speech and language. No gross focal neurologic deficits are appreciated.    ED Results / Procedures / Treatments   Labs (all labs ordered are listed, but only abnormal results are displayed) Labs Reviewed  LIPASE, BLOOD - Abnormal; Notable for the following components:      Result Value   Lipase 89 (*)    All other components within normal limits  COMPREHENSIVE METABOLIC PANEL WITH GFR - Abnormal; Notable for the following components:   Sodium 130 (*)    CO2 19 (*)  Glucose, Bld 148 (*)    Calcium  8.2 (*)    Albumin 2.9 (*)    AST 75 (*)    ALT 79 (*)    Alkaline Phosphatase 144 (*)    All other components within normal limits  CBC - Abnormal; Notable for the following components:   WBC 15.3 (*)    Hemoglobin 15.2 (*)    All other components within normal limits  URINALYSIS, ROUTINE W REFLEX MICROSCOPIC - Abnormal; Notable for the following components:   Color, Urine AMBER (*)    APPearance CLOUDY (*)    Protein, ur 100 (*)    Leukocytes,Ua SMALL (*)    All other components within normal limits  URINE CULTURE  GASTROINTESTINAL PANEL BY PCR, STOOL (REPLACES STOOL CULTURE)  CULTURE, BLOOD (ROUTINE X 2)  CULTURE, BLOOD (ROUTINE X 2)  LACTIC ACID, PLASMA     EKG  ED ECG REPORT I, Twilla Galea, the attending physician, personally viewed and  interpreted this ECG.   Date: 07/20/2023  EKG Time: 15:47  Rate: 67  Rhythm: normal sinus rhythm  Axis: Normal  Intervals:none  ST&T Change: None  RADIOLOGY Chest x-ray reviewed and interpreted by me with no infiltrate, edema, or effusion.  PROCEDURES:  Critical Care performed: No  Procedures   MEDICATIONS ORDERED IN ED: Medications  metroNIDAZOLE (FLAGYL) IVPB 500 mg (500 mg Intravenous New Bag/Given 07/20/23 1547)  vancomycin (VANCOCIN) IVPB 1000 mg/200 mL premix (has no administration in time range)  vancomycin (VANCOREADY) IVPB 750 mg/150 mL (has no administration in time range)  ceFEPIme (MAXIPIME) 2 g in sodium chloride  0.9 % 100 mL IVPB (0 g Intravenous Stopped 07/20/23 1540)  lactated ringers  bolus 1,000 mL (1,000 mLs Intravenous New Bag/Given 07/20/23 1459)     IMPRESSION / MDM / ASSESSMENT AND PLAN / ED COURSE  I reviewed the triage vital signs and the nursing notes.                              63 y.o. female with past medical history of hypothyroidism, migraines, chronic pain syndrome, and discoid lupus who presents to the ED complaining of weakness, fatigue, subjective fevers, joint aches, headache, and diarrhea for the past 5 or 6 days.  Patient's presentation is most consistent with acute presentation with potential threat to life or bodily function.  Differential diagnosis includes, but is not limited to, sepsis, meningitis, gastroenteritis, dehydration, electrolyte abnormality, AKI, UTI.  Patient nontoxic-appearing and in no acute distress, noted to be febrile to 101.3 at the walk-in clinic but afebrile here in the ED.  She has a diffuse maculopapular rash, potentially consistent with viral exanthem, patient does state that her vaccines are up-to-date.  She reports a headache but has no meningismus on exam, low suspicion for bacterial meningitis.  She has a benign abdominal exam and no urinary symptoms, although urinalysis could represent UTI and we will send  for culture.  Chest x-ray is negative for acute process, no respiratory symptoms noted.  Will attempt to obtain stool sample, labs with leukocytosis but no significant anemia, electrolyte abnormality, or AKI.  Patient with mild transaminitis, seems nonspecific, lactic acid within normal limits.  She is immunocompromised given she takes methotrexate regularly, will initiate sepsis workup and give dose of broad-spectrum IV antibiotics.  EKG is unremarkable, case discussed with hospitalist for admission.      FINAL CLINICAL IMPRESSION(S) / ED DIAGNOSES   Final diagnoses:  Sepsis without acute organ  dysfunction, due to unspecified organism (HCC)  Diarrhea of infectious origin  Diffuse papular rash     Rx / DC Orders   ED Discharge Orders     None        Note:  This document was prepared using Dragon voice recognition software and may include unintentional dictation errors.   Twilla Galea, MD 07/20/23 [id number]

## 2023-07-20 NOTE — ED Triage Notes (Signed)
 Pt reports fatigue, rash, headache, joint pain and vomiting.

## 2023-07-20 NOTE — ED Notes (Signed)
 [facility] notified of cardiac monitoring

## 2023-07-20 NOTE — H&P (Signed)
 History and Physical    Helen Freeman ZOX:[id number] DOB: March 05, 1960 DOA: 07/20/2023  PCP: [name], FNP (Confirm with patient/family/NH records and if not entered, this has to be entered at [facility] point of entry) Patient coming from: Home  I have personally briefly reviewed patient's old medical records in [facility]  Chief Complaint: Malaise, headache, diarrhea, fever, rash  HPI: Helen Freeman is a 62 y.o. female with medical history significant of discoid SLE on methotrexate, HTN, HLD, hypothyroidism, presented with multiple complaints including malaise weakness headache diarrhea fever and rash.  Symptoms started last Tuesday, patient started to feel malaise low energy.  Thursday, patient started develop severe dull-like headache, on the backside of the head, and a sore throat, with dry cough, soon she started develop loose diarrhea severe 2 episode that day and then continue to have diarrhea until today.  Instructed diarrhea with loose then turned to watery 3 days ago after she lost appetite has been on only liquid diet.  She also has had subjective fever and night sweats.  Denied any abdominal pain no nauseous vomiting.  Gradually, she started to notice new onset rash over the weekend Saturday-Sunday, non-itchy, extensively distributed on her chest abdomen torso and limbs and face.  Meantime she also started develop small joint pains including bilateral hands and bilateral feet, for which she has been taking some over-the-counter pain medications.  With some relief.  Went to see urgent care today was found to have fever 101.3 and sent to ED.  She denied any recent travel to the mountains or beach, does not remember any tick bite.  No sick contact, no new medications. ED Course: Temperature 98.1 borderline tachycardia blood pressure 103/75 O2 saturation 100% on room air.  Blood work showed WBC 15 hemoglobin 15 BUN 15 creatinine 0.8 K3.5 bicarb 19.  Chest x-ray negative for acute  infiltrates, UA WBC 2+ RBC 1+ squamous cell 2+  Patient was given vancomycin cefepime and Flagyl and IV bolus 1000 mL in the ED.  Review of Systems: As per HPI otherwise 14 point review of systems negative.    Past Medical History:  Diagnosis Date   Anxiety    Arthritis    Chronic gastritis    egd 04/18/16 + chronic gastritis and EGD 06/16/16 neg H pylori    Depression    Discoid lupus    causes scaly plaques on scalp face and ears   Eczema    Fibroids    Fibromyalgia    Gallstones    11/25/17 US    Hashimoto's disease    History of kidney stones    Hyperlipemia    Hypothyroidism    Lateral epicondylitis    tennis elbow   Migraines    Morphea    localized scleroderma   Plantar fasciitis    PLE (polymorphic light eruption)    sunlight  exposure in spring or summer causes skin rash   Prediabetes    Right lateral epicondylitis    Sleep apnea    Urine incontinence     Past Surgical History:  Procedure Laterality Date   ABDOMINAL HYSTERECTOMY     uterus and both ovaries out per pt cervix intact h/o PID in 1986, scar tissue, hemorrhagic cysts; multiple pelvic surgeries 1986 to 2006    APPENDECTOMY     yrs ago   cataract surgery Bilateral 06/2022   cervical radiofrequency     in [place]    CYSTOSCOPY/URETEROSCOPY/HOLMIUM LASER/STENT PLACEMENT Left 09/02/2022   Procedure: CYSTOSCOPY, LEFT URETEROSCOPY,  RETROGRADE PYELOGRAM;  HOLMIUM LASER LITHOTRIPSY, AND LEFT URETERAL STENT PLACEMENT;  Surgeon: Adelbert Homans, MD;  Location: [facility];  Service: Urology;  Laterality: Left;   ESOPHAGOGASTRODUODENOSCOPY     02/27/20 s/p sleeve gastrectomy, diaphragmatic hiatal hernia 3 cm nl esophagus and duodenum 2nd part   EXTRACORPOREAL SHOCK WAVE LITHOTRIPSY Left 12/20/2015   Procedure: EXTRACORPOREAL SHOCK WAVE LITHOTRIPSY (ESWL);  Surgeon: Bart Born, MD;  Location: [facility];  Service: Urology;  Laterality: Left;   GALLBLADDER SURGERY     02/2018 Dr.  Therese Flash   HIATAL HERNIA REPAIR     05/2019 Dr. Merrily Able with complications hematoma   LAPAROSCOPIC GASTRIC SLEEVE RESECTION     06/2016 Dr. Therese Flash    LAPAROSCOPIC TOTAL HYSTERECTOMY  2004   multiple pelvic surgeries     1980's   OTHER SURGICAL HISTORY     ROTATOR CUFF REPAIR Right    02/2004     reports that she has quit smoking. She has never been exposed to tobacco smoke. She has never used smokeless tobacco. She reports that she does not drink alcohol and does not use drugs.  Allergies  Allergen Reactions   Plaquenil  [Hydroxychloroquine Sulfate] Rash   Lactose Intolerance (Gi) Diarrhea   Other Rash    Sunlight  Polymorphous light eruption    Plaquenil [Hydroxychloroquine] Rash    Family History  Problem Relation Age of Onset   Allergic rhinitis Mother    Arthritis Mother    Hyperlipidemia Mother    Hypertension Mother    Heart disease Father    Early death Father    Kidney cancer Maternal Grandmother    Hypertension Maternal Grandmother    Asthma Son    Asthma Maternal Aunt    Colon cancer Paternal Uncle    Prostate cancer Neg Hx      Prior to Admission medications   Medication Sig Start Date End Date Taking? Authorizing Provider  atorvastatin  (LIPITOR) 20 MG tablet Take 1 tablet (20 mg total) by mouth daily. Patient taking differently: Take 20 mg by mouth every evening. 10/11/21  Yes McLean-Scocuzza, Karon Packer, MD  Carboxymethylcellulose Sodium 0.25 % SOLN Place 1 drop into both eyes 4 (four) times daily as needed (dry eyes).   Yes [provider]  cetirizine  (ZYRTEC ) 10 MG tablet TAKE 1 TABLET BY MOUTH DAILY 09/01/22  Yes [name], Hanley Lew, FNP  cyclobenzaprine  (FLEXERIL ) 5 MG tablet Take 1 tablet (5 mg total) by mouth 3 (three) times daily as needed for muscle spasms. 01/17/22  Yes [name], Hanley Lew, FNP  desonide (DESOWEN) 0.05 % cream Apply 1 Application topically 2 (two) times daily as needed (eczema). 05/04/23  Yes [provider]  eletriptan  (RELPAX) 40 MG tablet Take 40 mg by mouth as needed for migraine or headache. May repeat in 2 hours if headache persists or recurs.   Yes [provider]  Eszopiclone  3 MG TABS Take 1 tablet (3 mg total) by mouth at bedtime as needed. Take immediately before bedtime Patient taking differently: Take 3 mg by mouth at bedtime. Take immediately before bedtime, lunesta  10/11/21  Yes McLean-Scocuzza, Karon Packer, MD  FLUoxetine (PROZAC) 10 MG capsule Take 20 mg by mouth daily. 07/05/23  Yes [provider]  fluticasone  (FLONASE ) 50 MCG/ACT nasal spray Place 2 sprays into both nostrils daily as needed for allergies or rhinitis. fluticasone  propionate 50 mcg/actuation nasal spray,suspension 10/11/21  Yes McLean-Scocuzza, Karon Packer, MD  gabapentin  (NEURONTIN ) 100 MG capsule Take 1 capsule (  100 mg total) by mouth at bedtime. Patient taking differently: Take 100 mg by mouth 3 times/day as needed-between meals & bedtime. 10/16/21  Yes McLean-Scocuzza, Karon Packer, MD  levothyroxine  (SYNTHROID ) 125 MCG tablet Take 1 tablet (125 mcg total) by mouth daily. 10/20/22  Yes [name], FNP  Magnesium Oxide 420 MG TABS Take 420 mg by mouth at bedtime. 07/05/23  Yes [provider]  methotrexate (RHEUMATREX) 2.5 MG tablet Take 15 mg by mouth once a week. Take six tablets weekly   Yes [provider]  pantoprazole  (PROTONIX ) 40 MG tablet Take 40 mg by mouth daily. 05/15/23  Yes [provider]  prednisoLONE acetate (PRED FORTE) 1 % ophthalmic suspension  06/09/22  Yes [provider]  rizatriptan  (MAXALT -MLT) 10 MG disintegrating tablet Take 1 tablet (10 mg total) by mouth as needed for migraine. May repeat in 2 hours if needed. No more than 2x per week. No more than 30 mg in 24 hrs 10/11/21  Yes McLean-Scocuzza, Karon Packer, MD  topiramate  (TOPAMAX ) 25 MG tablet Take 1 tablet (25 mg total) by mouth 2 (two) times daily. 10/11/21  Yes McLean-Scocuzza, Karon Packer, MD  triamcinolone  cream (KENALOG)  0.1 % Apply 1 Application topically 2 (two) times daily as needed (raised scaly areas). 06/22/17  Yes [provider]  azelastine  (ASTELIN ) 0.1 % nasal spray Place 1 spray into both nostrils 2 (two) times daily. Use in each nostril as directed 02/28/22   Kandice Orleans, MD  DULoxetine  (CYMBALTA ) 60 MG capsule Take 1 capsule (60 mg total) by mouth daily. 12/12/22   [name], FNP  folic acid  (FOLVITE ) 1 MG tablet Take 1 mg by mouth daily. 04/11/19   [provider]  hydrocortisone  2.5 % cream Apply topically 2 (two) times daily. Prn neck 11/08/19   McLean-Scocuzza, Karon Packer, MD  ipratropium (ATROVENT ) 0.06 % nasal spray Place 2 sprays into both nostrils 4 (four) times daily. Patient taking differently: Place 2 sprays into both nostrils as needed. 10/11/21   McLean-Scocuzza, Karon Packer, MD  ketorolac  (TORADOL ) 10 MG tablet Take 1 tablet (10 mg total) by mouth every 6 (six) hours as needed. 08/22/22   Lesage Butter, PA  metFORMIN  (GLUCOPHAGE -XR) 500 MG 24 hr tablet TAKE FOUR TABLETS BY MOUTH EVERY EVENING Patient taking differently: 2 (two) times daily with a meal. 08/08/22   [name], FNP  Methotrexate 2.5 MG/ML SOLN 15 mg once a week. 03/14/19   [provider]  ondansetron  (ZOFRAN -ODT) 4 MG disintegrating tablet Take 1 tablet (4 mg total) by mouth every 8 (eight) hours as needed for nausea or vomiting. 08/22/22   Olmitz Butter, PA  oxybutynin  (DITROPAN ) 5 MG tablet Take 1 tablet (5 mg total) by mouth every 8 (eight) hours as needed for bladder spasms. 09/02/22   Adelbert Homans, MD  oxyCODONE  (ROXICODONE ) 5 MG immediate release tablet Take 1 tablet (5 mg total) by mouth every 6 (six) hours as needed for severe pain or breakthrough pain. 08/22/22   Pocola Butter, PA  oxyCODONE -acetaminophen  (PERCOCET) 7.5-325 MG tablet Take 1 tablet by mouth every 4 (four) hours as needed for severe pain. 09/02/22 09/02/23  Adelbert Homans, MD  phenazopyridine   (PYRIDIUM ) 200 MG tablet Take 1 tablet (200 mg total) by mouth 3 (three) times daily as needed (for pain with urination). 09/02/22 09/02/23  Adelbert Homans, MD  sodium chloride  (OCEAN) 0.65 % SOLN nasal spray Place 2 sprays into both nostrils as needed  for congestion. 10/11/21   McLean-Scocuzza, Karon Packer, MD  tamsulosin  (FLOMAX ) 0.4 MG CAPS capsule Take 1 capsule (0.4 mg total) by mouth daily as needed (Left flank pain). 08/22/22    Butter, PA  traZODone  (DESYREL ) 50 MG tablet Take 1 tablet (50 mg total) by mouth at bedtime. Sleep, depression 01/17/22   [name], FNP    Physical Exam: Vitals:   07/20/23 1020 07/20/23 1023 07/20/23 1415 07/20/23 1429  BP:  103/75 (!) 139/92   Pulse:  (!) 107 72   Resp:  20 18   Temp:  99.1 F (37.3 C)  97.6 F (36.4 C)  TempSrc:  Oral  Oral  SpO2:  97% 100%   Weight: 77.1 kg     Height: 5' 2 (1.575 m)       Constitutional: NAD, calm, comfortable Vitals:   07/20/23 1020 07/20/23 1023 07/20/23 1415 07/20/23 1429  BP:  103/75 (!) 139/92   Pulse:  (!) 107 72   Resp:  20 18   Temp:  99.1 F (37.3 C)  97.6 F (36.4 C)  TempSrc:  Oral  Oral  SpO2:  97% 100%   Weight: 77.1 kg     Height: 5' 2 (1.575 m)      Eyes: PERRL, lids and conjunctivae normal ENMT: Mucous membranes are moist. Posterior pharynx clear of any exudate or lesions.Normal dentition.  Rash on the pharynx Neck: normal, supple, no masses, no thyromegaly Respiratory: clear to auscultation bilaterally, no wheezing, no crackles. Normal respiratory effort. No accessory muscle use.  Cardiovascular: Regular rate and rhythm, no murmurs / rubs / gallops. No extremity edema. 2+ pedal pulses. No carotid bruits.  Abdomen: no tenderness, no masses palpated. No hepatosplenomegaly. Bowel sounds positive.  Musculoskeletal: no clubbing / cyanosis. No joint deformity upper and lower extremities. Good ROM, no contractures. Normal muscle tone.  Skin: Diffused macular rash,  blanchable, involving torso limbs face neck back and pulm and sole. Neurologic: CN 2-12 grossly intact. Sensation intact, DTR normal. Strength 5/5 in all 4.  Negative for meningeal sign Psychiatric: Normal judgment and insight. Alert and oriented x 3. Normal mood.            Labs on Admission: I have personally reviewed following labs and imaging studies  CBC: Recent Labs  Lab 07/20/23 1023  WBC 15.3*  HGB 15.2*  HCT 43.4  MCV 87.1  PLT 175   Basic Metabolic Panel: Recent Labs  Lab 07/20/23 1023  NA 130*  K 3.5  CL 101  CO2 19*  GLUCOSE 148*  BUN 15  CREATININE 0.80  CALCIUM  8.2*   GFR: Estimated Creatinine Clearance: 70.1 mL/min (by C-G formula based on SCr of 0.8 mg/dL). Liver Function Tests: Recent Labs  Lab 07/20/23 1023  AST 75*  ALT 79*  ALKPHOS 144*  BILITOT 1.2  PROT 7.0  ALBUMIN 2.9*   Recent Labs  Lab 07/20/23 1023  LIPASE 89*   No results for input(s): AMMONIA in the last 168 hours. Coagulation Profile: No results for input(s): INR, PROTIME in the last 168 hours. Cardiac Enzymes: No results for input(s): CKTOTAL, CKMB, CKMBINDEX, TROPONINI in the last 168 hours. BNP (last 3 results) No results for input(s): PROBNP in the last 8760 hours. HbA1C: No results for input(s): HGBA1C in the last 72 hours. CBG: No results for input(s): GLUCAP in the last 168 hours. Lipid Profile: No results for input(s): CHOL, HDL, LDLCALC, TRIG, CHOLHDL, LDLDIRECT in the last 72 hours. Thyroid  Function Tests: No  results for input(s): TSH, T4TOTAL, FREET4, T3FREE, THYROIDAB in the last 72 hours. Anemia Panel: No results for input(s): VITAMINB12, FOLATE, FERRITIN, TIBC, IRON, RETICCTPCT in the last 72 hours. Urine analysis:    Component Value Date/Time   COLORURINE AMBER (A) 07/20/2023 1023   APPEARANCEUR CLOUDY (A) 07/20/2023 1023   APPEARANCEUR Clear 09/29/2019 0842   LABSPEC 1.019 07/20/2023 1023    PHURINE 5.0 07/20/2023 1023   GLUCOSEU NEGATIVE 07/20/2023 1023   HGBUR NEGATIVE 07/20/2023 1023   BILIRUBINUR NEGATIVE 07/20/2023 1023   BILIRUBINUR Negative 09/29/2019 0842   KETONESUR NEGATIVE 07/20/2023 1023   PROTEINUR 100 (A) 07/20/2023 1023   NITRITE NEGATIVE 07/20/2023 1023   LEUKOCYTESUR SMALL (A) 07/20/2023 1023    Radiological Exams on Admission: DG Chest 2 View Result Date: 07/20/2023 CLINICAL DATA:  Shortness of breath and fever.  Fatigue. EXAM: CHEST - 2 VIEW COMPARISON:  No comparison studies available. FINDINGS: The lungs are clear without focal pneumonia, edema, pneumothorax or pleural effusion. Linear density left mid lung is compatible with atelectasis. The cardiopericardial silhouette is within normal limits for size. No acute bony abnormality. IMPRESSION: Left mid lung atelectasis. No acute cardiopulmonary findings. Electronically Signed   By: Donnal Fusi M.D.   On: 07/20/2023 10:45    EKG: Independently reviewed.  Sinus rhythm, no acute ST changes.  Assessment/Plan Principal Problem:   Sepsis (HCC) Active Problems:   Macular rash  (please populate well all problems here in Problem List. (For example, if patient is on BP meds at home and you resume or decide to hold them, it is a problem that needs to be her. Same for CAD, COPD, HLD and so on)  Sepsis, without acute endorgan damage - Sepsis as evidenced by new onset of fever, leukocytosis, source of infection, so far clinically suspect [facility] spotted fever given the extensive involvement of different body systems. - Discussed with infectious disease attending Dr. Harwood Lingo, who recommended send out [facility] spotted fever serology and started treatment with doxycycline  100 mg twice daily -Hold off on broad-spectrum antibiotics - Symptomatic management for diarrhea.  GI pathogen panel pending - Other Ddx, drug rash less likely, as patient reported that there is no new medication recently. STD  unlikely.  No meningeal sign or mentation changes, low suspicion for meningitis or encephalitis.  Discoid lupus - Resume MTX next week - As needed narcotics for joint pain  Acute transaminitis - No RUQ symptoms, etiology likely secondary to RMSF infection, trend LFTs  IIDM -SSI for now  Hypothyroidism - Continue Synthroid      DVT prophylaxis: Lovenox Code Status: Full code Family Communication: None at bedside Disposition Plan: Patient is sick with sepsis with diffuse rash and other symptoms, requiring close inpatient monitoring for clinical progress and treatment, expect more than 2 midnight hospital stay Consults called: Infectious disease Admission status: Telemetry admission   [place] MD [facility] Pager [phone]  07/20/2023, 5:12 PM

## 2023-07-20 NOTE — Sepsis Progress Note (Signed)
 Elink will follow per sepsis protocol.

## 2023-07-20 NOTE — ED Notes (Addendum)
 Pt has rash over whole body which she noticed 3 days ago. Appears to be petechiae on legs. Rash is also on face. Pt has been fatigued for last several days. Eyes are bloodshot. Denies tick bites. Had 2 episodes of stool incontinence last week and has had poor appetite. Has also had night sweats a couple times this week and chills.

## 2023-07-20 NOTE — Consult Note (Signed)
 CODE SEPSIS - PHARMACY COMMUNICATION  **Broad Spectrum Antibiotics should be administered within 1 hour of Sepsis diagnosis**  Time Code Sepsis Called/Page Received: 1444  Antibiotics Ordered: cefepime, metronidazole, vancomycin  Time of 1st antibiotic administration: 1508  Additional action taken by pharmacy: none  If necessary, Name of Provider/Nurse Contacted: n/a  Haydee Jabbour Salvatierra-Guzman PharmD, BCPS 07/20/2023 3:10 PM

## 2023-07-21 DIAGNOSIS — A419 Sepsis, unspecified organism: Secondary | ICD-10-CM | POA: Diagnosis not present

## 2023-07-21 LAB — GLUCOSE, CAPILLARY
Glucose-Capillary: 178 mg/dL — ABNORMAL HIGH (ref 70–99)
Glucose-Capillary: 180 mg/dL — ABNORMAL HIGH (ref 70–99)
Glucose-Capillary: 214 mg/dL — ABNORMAL HIGH (ref 70–99)

## 2023-07-21 LAB — HIV ANTIBODY (ROUTINE TESTING W REFLEX): HIV Screen 4th Generation wRfx: NONREACTIVE

## 2023-07-21 LAB — COMPREHENSIVE METABOLIC PANEL WITH GFR
ALT: 60 U/L — ABNORMAL HIGH (ref 0–44)
AST: 51 U/L — ABNORMAL HIGH (ref 15–41)
Albumin: 2.5 g/dL — ABNORMAL LOW (ref 3.5–5.0)
Alkaline Phosphatase: 119 U/L (ref 38–126)
Anion gap: 8 (ref 5–15)
BUN: 13 mg/dL (ref 8–23)
CO2: 22 mmol/L (ref 22–32)
Calcium: 7.7 mg/dL — ABNORMAL LOW (ref 8.9–10.3)
Chloride: 104 mmol/L (ref 98–111)
Creatinine, Ser: 0.71 mg/dL (ref 0.44–1.00)
GFR, Estimated: >60 mL/min (ref >60)
Glucose, Bld: 129 mg/dL — ABNORMAL HIGH (ref 70–99)
Potassium: 3.3 mmol/L — ABNORMAL LOW (ref 3.5–5.1)
Sodium: 134 mmol/L — ABNORMAL LOW (ref 135–145)
Total Bilirubin: 1 mg/dL (ref 0.0–1.2)
Total Protein: 6 g/dL — ABNORMAL LOW (ref 6.5–8.1)

## 2023-07-21 LAB — CBC
HCT: 37.4 % (ref 36.0–46.0)
Hemoglobin: 13 g/dL (ref 12.0–15.0)
MCH: 30.7 pg (ref 26.0–34.0)
MCHC: 34.8 g/dL (ref 30.0–36.0)
MCV: 88.2 fL (ref 80.0–100.0)
Platelets: 169 10*3/uL (ref 150–400)
RBC: 4.24 MIL/uL (ref 3.87–5.11)
RDW: 14.2 % (ref 11.5–15.5)
WBC: 14.2 10*3/uL — ABNORMAL HIGH (ref 4.0–10.5)
nRBC: 0 % (ref 0.0–0.2)

## 2023-07-21 LAB — URINE CULTURE

## 2023-07-21 LAB — CBG MONITORING, ED: Glucose-Capillary: 116 mg/dL — ABNORMAL HIGH (ref 70–99)

## 2023-07-21 LAB — PHOSPHORUS: Phosphorus: 3.1 mg/dL (ref 2.5–4.6)

## 2023-07-21 LAB — MAGNESIUM: Magnesium: 2.2 mg/dL (ref 1.7–2.4)

## 2023-07-21 MED ORDER — DULOXETINE HCL 30 MG PO CPEP
60.0000 mg | ORAL_CAPSULE | Freq: Every day | ORAL | Status: DC
  Administered 2023-07-21 – 2023-07-23 (×3): 60 mg via ORAL
  Filled 2023-07-21: qty 2
  Filled 2023-07-21: qty 1
  Filled 2023-07-21: qty 2

## 2023-07-21 MED ORDER — SODIUM CHLORIDE 0.9 % IV BOLUS
500.0000 mL | Freq: Once | INTRAVENOUS | Status: AC
  Administered 2023-07-21: 500 mL via INTRAVENOUS

## 2023-07-21 MED ORDER — MIDODRINE HCL 5 MG PO TABS
5.0000 mg | ORAL_TABLET | Freq: Three times a day (TID) | ORAL | Status: DC
  Administered 2023-07-21 – 2023-07-23 (×6): 5 mg via ORAL
  Filled 2023-07-21 (×6): qty 1

## 2023-07-21 MED ORDER — MORPHINE SULFATE (PF) 2 MG/ML IV SOLN
2.0000 mg | INTRAVENOUS | Status: DC | PRN
  Administered 2023-07-21: 2 mg via INTRAVENOUS
  Filled 2023-07-21: qty 1

## 2023-07-21 MED ORDER — METHYLPREDNISOLONE SODIUM SUCC 125 MG IJ SOLR
125.0000 mg | Freq: Once | INTRAMUSCULAR | Status: AC
  Administered 2023-07-21: 125 mg via INTRAVENOUS
  Filled 2023-07-21: qty 2

## 2023-07-21 MED ORDER — POTASSIUM CHLORIDE CRYS ER 20 MEQ PO TBCR
40.0000 meq | EXTENDED_RELEASE_TABLET | Freq: Once | ORAL | Status: AC
  Administered 2023-07-21: 40 meq via ORAL
  Filled 2023-07-21: qty 2

## 2023-07-21 NOTE — ED Notes (Signed)
Informed Rn bed assigned  

## 2023-07-21 NOTE — Progress Notes (Signed)
 [facility] Progress Note  Patient: Helen Freeman    WUJ:[id number]  DOA: 07/20/2023     Date of Service: the patient was seen and examined on 07/21/2023  Chief Complaint  Patient presents with   Fatigue   Brief hospital course: Helen Freeman is a 63 y.o. 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  She went to urgent care where she was found to have fever 101.3 and sent to ED.  She denied any recent travel to the mountains or beach, does not remember any tick bite.  No sick contact, no new medications. ED Course: Temperature 98.1 borderline tachycardia blood pressure 103/75 O2 saturation 100% on room air.  Blood work showed WBC 15 hemoglobin 15 BUN 15 creatinine 0.8 K3.5 bicarb 19.  Chest x-ray negative for acute infiltrates, UA WBC 2+ RBC 1+ squamous cell 2+   Patient was given vancomycin cefepime and Flagyl and IV bolus 1000 mL in the ED.  Assessment and Plan:  # Sepsis, without acute endorgan damage - Sepsis as evidenced by new onset of fever, leukocytosis, source of infection, so far clinically suspect [facility] spotted fever given the extensive involvement of different body systems. - Discussed with infectious disease attending Dr. Harwood Lingo, who recommended send out [facility] spotted fever serology and started treatment with doxycycline  100 mg twice daily -Hold off on broad-spectrum antibiotics - Symptomatic management for diarrhea.  GI pathogen panel and c.diff pending - Other Ddx, drug rash less likely, as patient reported that there is no new medication recently. STD unlikely.  No meningeal sign or mentation changes, low suspicion for meningitis or encephalitis. 6/17 Solu-Medrol  125 mg IV one-time dose given for possible allergic reaction   Hypotension due to sepsis S/p IV fluid given for bolus Started  midodrine Monitor BP and titrate medications accordingly  Hypokalemia due to gastroenteritis Potassium repleted. Monitor electrolytes daily   Discoid lupus - Resume MTX next week - As needed narcotics for joint pain   Acute transaminitis - No RUQ symptoms, etiology likely secondary to RMSF infection, trend LFTs   Elevated lipase could be secondary to sepsis Trend lipase level  IIDM -SSI for now   Hypothyroidism;  Continue Synthroid     Body mass index is 31.09 kg/m.  Interventions:  Diet: Regular diet DVT Prophylaxis: Subcutaneous Lovenox   Advance goals of care discussion: Full code  Family Communication: family was not present at bedside, at the time of interview.  The pt provided permission to discuss medical plan with the family. Opportunity was given to ask question and all questions were answered satisfactorily.   Disposition:  Pt is from home, admitted with sepsis possible RMSF, still has rash [place] workup pending, which precludes a safe discharge. Discharge to home, when stable, may need few days to improve.  Subjective: No significant events overnight, patient is feeling a lot better, no diarrhea since morning today, yesterday she had 1 episode.  Headache resolved.  Joint pain almost resolved only has some pain in her thumb.  Still has generalized rash which is getting better, no itching or pain or tenderness.  No new rashes.  Physical Exam: General: NAD, lying comfortably Appear in no distress, affect appropriate Eyes: PERRLA ENT: Oral Mucosa Clear, moist  Neck: no JVD,  Cardiovascular: S1 and S2 Present, no Murmur,  Respiratory: good respiratory effort, Bilateral Air entry equal and Decreased, no Crackles, no wheezes Abdomen: Bowel Sound  present, Soft and no tenderness,  Skin: Maculopapular rash, generalized whole body Extremities: no Pedal edema, no calf tenderness Neurologic: without any new focal findings Gait not checked due to patient safety  concerns  Vitals:   07/21/23 1000 07/21/23 1100 07/21/23 1121 07/21/23 1152  BP: 93/67 100/67  99/74  Pulse: 83 75  75  Resp: (!) 22 (!) 25  19  Temp:   98.9 F (37.2 C) 97.8 F (36.6 C)  TempSrc:      SpO2: 95% 94%  96%  Weight:      Height:        Intake/Output Summary (Last 24 hours) at 07/21/2023 1348 Last data filed at 07/20/2023 1838 Gross per 24 hour  Intake 1400 ml  Output --  Net 1400 ml   Filed Weights   07/20/23 1020  Weight: 77.1 kg    Data Reviewed: I have personally reviewed and interpreted daily labs, tele strips, imagings as discussed above. I reviewed all nursing notes, pharmacy notes, vitals, pertinent old records I have discussed plan of care as described above with RN and patient/family.  CBC: Recent Labs  Lab 07/20/23 1023 07/21/23 0420  WBC 15.3* 14.2*  HGB 15.2* 13.0  HCT 43.4 37.4  MCV 87.1 88.2  PLT 175 169   Basic Metabolic Panel: Recent Labs  Lab 07/20/23 1023 07/21/23 0420  NA 130* 134*  K 3.5 3.3*  CL 101 104  CO2 19* 22  GLUCOSE 148* 129*  BUN 15 13  CREATININE 0.80 0.71  CALCIUM  8.2* 7.7*  MG  --  2.2  PHOS  --  3.1    Studies: No results found.  Scheduled Meds:  acidophilus  1 capsule Oral TID   doxycycline   100 mg Oral Q12H   DULoxetine   60 mg Oral Daily   enoxaparin (LOVENOX) injection  40 mg Subcutaneous Q24H   FLUoxetine  20 mg Oral Daily   gabapentin   100 mg Oral BID   insulin aspart  0-9 Units Subcutaneous TID WC   levothyroxine   125 mcg Oral Q0600   loratadine  10 mg Oral Daily   pantoprazole   40 mg Oral Daily   prednisoLONE acetate  1 drop Both Eyes QID   topiramate   25 mg Oral BID   traZODone   50 mg Oral QHS   Continuous Infusions: PRN Meds: acetaminophen  **OR** acetaminophen , artificial tears, azelastine , cyclobenzaprine , eletriptan, fluticasone , hydrocortisone , ipratropium, morphine  injection, ondansetron  **OR** ondansetron  (ZOFRAN ) IV, oxybutynin , oxyCODONE , phenazopyridine , tamsulosin ,  zolpidem  Time spent: 55 minutes  Author: [name]. MD [facility] 07/21/2023 1:48 PM  To reach On-call, see care teams to locate the attending and reach out to them via [email or web address]. If 7PM-7AM, please contact night-coverage If you still have difficulty reaching the attending provider, please page the [facility] (Director on Call) for [facility] on amion for assistance.

## 2023-07-21 NOTE — TOC CM/SW Note (Signed)
 Transition of Care [facility]) - Inpatient Brief Assessment   Patient Details  Name: Helen Freeman MRN: [id number] Date of Birth: Jul 04, 1960  Transition of Care [facility]) CM/SW Contact:    Loman Risk, RN Phone Number: 07/21/2023, 12:55 PM   Clinical Narrative:   Transition of Care [facility]) Screening Note   Patient Details  Name: Helen Freeman Date of Birth: March 21, 1960   Transition of Care [facility]) CM/SW Contact:    Loman Risk, RN Phone Number: 07/21/2023, 12:55 PM    Transition of Care Department [facility]) has reviewed patient and no TOC needs have been identified at this time.  If new patient transition needs arise, please place a TOC consult.    Transition of Care Asessment: Insurance and Status: Insurance coverage has been reviewed Patient has primary care physician: Yes     Prior/Current Home Services: No current home services Social Drivers of Health Review: SDOH reviewed no interventions necessary Readmission risk has been reviewed: Yes Transition of care needs: no transition of care needs at this time

## 2023-07-22 DIAGNOSIS — A419 Sepsis, unspecified organism: Secondary | ICD-10-CM | POA: Diagnosis not present

## 2023-07-22 LAB — BASIC METABOLIC PANEL WITH GFR
Anion gap: 6 (ref 5–15)
BUN: 20 mg/dL (ref 8–23)
CO2: 21 mmol/L — ABNORMAL LOW (ref 22–32)
Calcium: 8.3 mg/dL — ABNORMAL LOW (ref 8.9–10.3)
Chloride: 106 mmol/L (ref 98–111)
Creatinine, Ser: 0.55 mg/dL (ref 0.44–1.00)
GFR, Estimated: >60 mL/min (ref >60)
Glucose, Bld: 192 mg/dL — ABNORMAL HIGH (ref 70–99)
Potassium: 3.9 mmol/L (ref 3.5–5.1)
Sodium: 133 mmol/L — ABNORMAL LOW (ref 135–145)

## 2023-07-22 LAB — PHOSPHORUS: Phosphorus: 3.4 mg/dL (ref 2.5–4.6)

## 2023-07-22 LAB — CBC
HCT: 35.1 % — ABNORMAL LOW (ref 36.0–46.0)
Hemoglobin: 12.4 g/dL (ref 12.0–15.0)
MCH: 31.2 pg (ref 26.0–34.0)
MCHC: 35.3 g/dL (ref 30.0–36.0)
MCV: 88.2 fL (ref 80.0–100.0)
Platelets: 212 10*3/uL (ref 150–400)
RBC: 3.98 MIL/uL (ref 3.87–5.11)
RDW: 14.6 % (ref 11.5–15.5)
WBC: 12.9 10*3/uL — ABNORMAL HIGH (ref 4.0–10.5)
nRBC: 0 % (ref 0.0–0.2)

## 2023-07-22 LAB — HEPATIC FUNCTION PANEL
ALT: 53 U/L — ABNORMAL HIGH (ref 0–44)
AST: 43 U/L — ABNORMAL HIGH (ref 15–41)
Albumin: 2.2 g/dL — ABNORMAL LOW (ref 3.5–5.0)
Alkaline Phosphatase: 119 U/L (ref 38–126)
Bilirubin, Direct: 0.3 mg/dL — ABNORMAL HIGH (ref 0.0–0.2)
Indirect Bilirubin: 0.5 mg/dL (ref 0.3–0.9)
Total Bilirubin: 0.8 mg/dL (ref 0.0–1.2)
Total Protein: 5.9 g/dL — ABNORMAL LOW (ref 6.5–8.1)

## 2023-07-22 LAB — GLUCOSE, CAPILLARY
Glucose-Capillary: 175 mg/dL — ABNORMAL HIGH (ref 70–99)
Glucose-Capillary: 248 mg/dL — ABNORMAL HIGH (ref 70–99)
Glucose-Capillary: 124 mg/dL — ABNORMAL HIGH (ref 70–99)
Glucose-Capillary: 176 mg/dL — ABNORMAL HIGH (ref 70–99)

## 2023-07-22 LAB — LIPASE, BLOOD: Lipase: 37 U/L (ref 11–51)

## 2023-07-22 LAB — MAGNESIUM: Magnesium: 2.1 mg/dL (ref 1.7–2.4)

## 2023-07-22 NOTE — Plan of Care (Signed)
  Problem: Fluid Volume: Goal: Ability to maintain a balanced intake and output will improve Outcome: Progressing   Problem: Nutritional: Goal: Maintenance of adequate nutrition will improve Outcome: Progressing   Problem: Skin Integrity: Goal: Risk for impaired skin integrity will decrease Outcome: Progressing   Problem: Education: Goal: Knowledge of General Education information will improve Description: Including pain rating scale, medication(s)/side effects and non-pharmacologic comfort measures Outcome: Progressing   Problem: Health Behavior/Discharge Planning: Goal: Ability to manage health-related needs will improve Outcome: Progressing

## 2023-07-22 NOTE — Progress Notes (Signed)
 [facility] Progress Note  Patient: Helen Freeman    WUJ:[id number]  DOA: 07/20/2023     Date of Service: the patient was seen and examined on 07/22/2023  Chief Complaint  Patient presents with   Fatigue   Brief hospital course: Aadvika Konen is a 62 y.o. 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  She went to urgent care where she was found to have fever 101.3 and sent to ED.  She denied any recent travel to the mountains or beach, does not remember any tick bite.  No sick contact, no new medications. ED Course: Temperature 98.1 borderline tachycardia blood pressure 103/75 O2 saturation 100% on room air.  Blood work showed WBC 15 hemoglobin 15 BUN 15 creatinine 0.8 K3.5 bicarb 19.  Chest x-ray negative for acute infiltrates, UA WBC 2+ RBC 1+ squamous cell 2+   Patient was given vancomycin cefepime and Flagyl and IV bolus 1000 mL in the ED.  Assessment and Plan:  # Sepsis, without acute endorgan damage - Sepsis as evidenced by new onset of fever, leukocytosis, source of infection, so far clinically suspect [facility] spotted fever given the extensive involvement of different body systems. - Discussed with infectious disease attending Dr. Harwood Lingo, who recommended send out [facility] spotted fever serology and started treatment with doxycycline  100 mg twice daily -Hold off on broad-spectrum antibiotics - Symptomatic management for diarrhea.  GI pathogen panel and c.diff pending - Other Ddx, drug rash less likely, as patient reported that there is no new medication recently. STD unlikely.  No meningeal sign or mentation changes, low suspicion for meningitis or encephalitis. 6/17 Solu-Medrol  125 mg IV one-time dose given for possible allergic reaction 6/18 diarrhea resolved, no fever.  Rash possibly due to viral infection.   Does not feel any improvement and no worsening.   Hypotension due to sepsis S/p IV fluid given for bolus Started midodrine Monitor BP and titrate medications accordingly  Hypokalemia due to gastroenteritis Potassium repleted. Monitor electrolytes daily   Discoid lupus - Resume MTX next week - As needed narcotics for joint pain   Acute transaminitis - No RUQ symptoms, etiology likely secondary to RMSF infection,  LFTs improving trend LFTs   Elevated lipase could be secondary to sepsis Lipase level 89--37 trended down to normal   IIDM -SSI for now   Hypothyroidism;  Continue Synthroid     Body mass index is 31.09 kg/m.  Interventions:  Diet: Regular diet DVT Prophylaxis: Subcutaneous Lovenox   Advance goals of care discussion: Full code  Family Communication: family was not present at bedside, at the time of interview.  The pt provided permission to discuss medical plan with the family. Opportunity was given to ask question and all questions were answered satisfactorily.   Disposition:  Pt is from home, admitted with sepsis possible RMSF, still has rash, workup pending, which precludes a safe discharge. Discharge to home, when stable, may need few days to improve.  Subjective: No significant events overnight, patient remained afebrile, no diarrhea, overall feels improvement.  Still has rash all over the body, does not feel any improvement and no new rash noticed.  Denies any itching or tenderness. Complaining of hip pain a little bit, no any other complaints   Physical Exam: General: NAD, lying comfortably Appear in no distress, affect appropriate Eyes: PERRLA ENT: Oral Mucosa Clear, moist  Neck: no JVD,  Cardiovascular: S1 and  S2 Present, no Murmur,  Respiratory: good respiratory effort, Bilateral Air entry equal and Decreased, no Crackles, no wheezes Abdomen: Bowel Sound present, Soft and no tenderness,  Skin: Maculopapular rash, generalized whole  body Extremities: no Pedal edema, no calf tenderness Neurologic: without any new focal findings Gait not checked due to patient safety concerns  Vitals:   07/21/23 2001 07/22/23 0506 07/22/23 0735 07/22/23 1434  BP: (!) 127/90 118/75 117/77 117/76  Pulse: 77 71 70 70  Resp: 19 17 20 20   Temp: (P) 97.6 F (36.4 C) 97.9 F (36.6 C) 97.6 F (36.4 C) 97.6 F (36.4 C)  TempSrc: (P) Oral     SpO2: 96% 94% 93% 95%  Weight:      Height:        Intake/Output Summary (Last 24 hours) at 07/22/2023 1501 Last data filed at 07/22/2023 1300 Gross per 24 hour  Intake 240 ml  Output --  Net 240 ml   Filed Weights   07/20/23 1020  Weight: 77.1 kg    Data Reviewed: I have personally reviewed and interpreted daily labs, tele strips, imagings as discussed above. I reviewed all nursing notes, pharmacy notes, vitals, pertinent old records I have discussed plan of care as described above with RN and patient/family.  CBC: Recent Labs  Lab 07/20/23 1023 07/21/23 0420 07/22/23 0346  WBC 15.3* 14.2* 12.9*  HGB 15.2* 13.0 12.4  HCT 43.4 37.4 35.1*  MCV 87.1 88.2 88.2  PLT 175 169 212   Basic Metabolic Panel: Recent Labs  Lab 07/20/23 1023 07/21/23 0420 07/22/23 0346  NA 130* 134* 133*  K 3.5 3.3* 3.9  CL 101 104 106  CO2 19* 22 21*  GLUCOSE 148* 129* 192*  BUN 15 13 20   CREATININE 0.80 0.71 0.55  CALCIUM  8.2* 7.7* 8.3*  MG  --  2.2 2.1  PHOS  --  3.1 3.4    Studies: No results found.  Scheduled Meds:  acidophilus  1 capsule Oral TID   doxycycline   100 mg Oral Q12H   DULoxetine   60 mg Oral Daily   enoxaparin (LOVENOX) injection  40 mg Subcutaneous Q24H   FLUoxetine  20 mg Oral Daily   gabapentin   100 mg Oral BID   insulin aspart  0-9 Units Subcutaneous TID WC   levothyroxine   125 mcg Oral Q0600   loratadine  10 mg Oral Daily   midodrine  5 mg Oral TID WC   pantoprazole   40 mg Oral Daily   topiramate   25 mg Oral BID   traZODone   50 mg Oral QHS   Continuous  Infusions: PRN Meds: acetaminophen  **OR** acetaminophen , artificial tears, azelastine , cyclobenzaprine , eletriptan, fluticasone , hydrocortisone , ipratropium, morphine  injection, ondansetron  **OR** ondansetron  (ZOFRAN ) IV, oxybutynin , oxyCODONE , phenazopyridine , tamsulosin , zolpidem  Time spent: 40 minutes  Author: [name]. MD [facility] 07/22/2023 3:01 PM  To reach On-call, see care teams to locate the attending and reach out to them via [email or web address]. If 7PM-7AM, please contact night-coverage If you still have difficulty reaching the attending provider, please page the [facility] (Director on Call) for [facility] on amion for assistance.

## 2023-07-22 NOTE — Plan of Care (Signed)

## 2023-07-23 ENCOUNTER — Other Ambulatory Visit: Payer: Self-pay

## 2023-07-23 DIAGNOSIS — A419 Sepsis, unspecified organism: Secondary | ICD-10-CM | POA: Diagnosis not present

## 2023-07-23 LAB — CBC
HCT: 35.7 % — ABNORMAL LOW (ref 36.0–46.0)
Hemoglobin: 12.2 g/dL (ref 12.0–15.0)
MCH: 30.5 pg (ref 26.0–34.0)
MCHC: 34.2 g/dL (ref 30.0–36.0)
MCV: 89.3 fL (ref 80.0–100.0)
Platelets: 273 10*3/uL (ref 150–400)
RBC: 4 MIL/uL (ref 3.87–5.11)
RDW: 14.8 % (ref 11.5–15.5)
WBC: 9.4 10*3/uL (ref 4.0–10.5)
nRBC: 0 % (ref 0.0–0.2)

## 2023-07-23 LAB — PHOSPHORUS: Phosphorus: 3.2 mg/dL (ref 2.5–4.6)

## 2023-07-23 LAB — HEPATIC FUNCTION PANEL
ALT: 57 U/L — ABNORMAL HIGH (ref 0–44)
AST: 51 U/L — ABNORMAL HIGH (ref 15–41)
Albumin: 2.2 g/dL — ABNORMAL LOW (ref 3.5–5.0)
Alkaline Phosphatase: 101 U/L (ref 38–126)
Bilirubin, Direct: 0.3 mg/dL — ABNORMAL HIGH (ref 0.0–0.2)
Indirect Bilirubin: 0.6 mg/dL (ref 0.3–0.9)
Total Bilirubin: 0.9 mg/dL (ref 0.0–1.2)
Total Protein: 5.5 g/dL — ABNORMAL LOW (ref 6.5–8.1)

## 2023-07-23 LAB — BASIC METABOLIC PANEL WITH GFR
Anion gap: 4 — ABNORMAL LOW (ref 5–15)
BUN: 21 mg/dL (ref 8–23)
CO2: 24 mmol/L (ref 22–32)
Calcium: 8.1 mg/dL — ABNORMAL LOW (ref 8.9–10.3)
Chloride: 108 mmol/L (ref 98–111)
Creatinine, Ser: 0.64 mg/dL (ref 0.44–1.00)
GFR, Estimated: >60 mL/min (ref >60)
Glucose, Bld: 143 mg/dL — ABNORMAL HIGH (ref 70–99)
Potassium: 4.1 mmol/L (ref 3.5–5.1)
Sodium: 136 mmol/L (ref 135–145)

## 2023-07-23 LAB — SPOTTED FEVER GROUP ANTIBODIES
Spotted Fever Group IgG: <1:64 {titer}
Spotted Fever Group IgM: <1:64 {titer}

## 2023-07-23 LAB — LIPASE, BLOOD: Lipase: 57 U/L — ABNORMAL HIGH (ref 11–51)

## 2023-07-23 LAB — MAGNESIUM: Magnesium: 2.2 mg/dL (ref 1.7–2.4)

## 2023-07-23 LAB — GLUCOSE, CAPILLARY
Glucose-Capillary: 110 mg/dL — ABNORMAL HIGH (ref 70–99)
Glucose-Capillary: 179 mg/dL — ABNORMAL HIGH (ref 70–99)

## 2023-07-23 LAB — VITAMIN D 25 HYDROXY (VIT D DEFICIENCY, FRACTURES): Vit D, 25-Hydroxy: 25.01 ng/mL — ABNORMAL LOW (ref 30–100)

## 2023-07-23 LAB — PROCALCITONIN: Procalcitonin: 1.27 ng/mL

## 2023-07-23 MED ORDER — DOXYCYCLINE HYCLATE 100 MG PO TABS
100.0000 mg | ORAL_TABLET | Freq: Two times a day (BID) | ORAL | 0 refills | Status: AC
  Filled 2023-07-23: qty 10, 5d supply, fill #0

## 2023-07-23 NOTE — Plan of Care (Signed)
   Problem: Coping: Goal: Ability to adjust to condition or change in health will improve Outcome: Progressing   Problem: Fluid Volume: Goal: Ability to maintain a balanced intake and output will improve Outcome: Progressing   Problem: Nutritional: Goal: Maintenance of adequate nutrition will improve Outcome: Progressing   Problem: Skin Integrity: Goal: Risk for impaired skin integrity will decrease Outcome: Progressing

## 2023-07-23 NOTE — Discharge Summary (Signed)
 [facility] Discharge Summary   Patient: Helen Freeman FMW:[id number]  PCP: Dineen Rollene MATSU, FNP  Date of admission: 07/20/2023   Date of discharge:  07/23/2023     Discharge Diagnoses:  Principal Problem:   Sepsis (HCC) Active Problems:   Macular rash   Admitted From: Home Disposition:  Home   Recommendations for Outpatient Follow-up:  Follow-up with PCP in 1 week,  Advised to continue diabetic diet and follow with PCP to repeat A1c after 12 weeks Follow up LABS/TEST:  repeat CBC, BMP and LFTs in 1 week. Follow-up pending spotted fever group antibodies and vitamin D  level Check hemoglobin A1c after 12 weeks.   Follow-up Information     Dineen Rollene MATSU, FNP Follow up in 1 week(s).   Specialty: Family Medicine Contact information: [address] [place] [address] [place] [address] [phone]                Diet recommendation: Carb modified diet  Activity: The patient is advised to gradually reintroduce usual activities, as tolerated  Discharge Condition: stable  Code Status: Full code   History of present illness: As per the H and P dictated on admission. Hospital Course:  Helen Freeman is a 63 y.o. 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  She went to urgent care where she was found to have fever 101.3 and sent to ED.  She denied any recent travel to the mountains or beach, does not remember any tick bite.  No sick contact, no new medications. ED Course: Temperature 98.1 borderline tachycardia blood pressure 103/75 O2 saturation 100% on room air.  Blood work showed WBC 15 hemoglobin 15 BUN 15 creatinine 0.8 K3.5 bicarb 19.  Chest x-ray negative for acute infiltrates, UA WBC 2+ RBC 1+ squamous cell 2+   Patient was given vancomycin  cefepime  and Flagyl  and IV bolus 1000 mL in the ED.   Assessment  and Plan:   # Sepsis, without acute endorgan damage Sepsis as evidenced by new onset of fever, leukocytosis, source of infection, so far clinically suspect [facility] spotted fever given the extensive involvement of different body systems. Discussed with infectious disease attending Dr. Epifanio, who recommended send out [facility] spotted fever serology and started treatment with doxycycline  100 mg twice daily Hold off on broad-spectrum antibiotics Symptomatic management for diarrhea.  GI pathogen panel and c.diff orders were placed but sample could not be sent because diarrhea resolved.  - Other Ddx, drug rash less likely, as patient reported that there is no new medication recently. STD unlikely.  No meningeal sign or mentation changes, low suspicion for meningitis or encephalitis. 6/17 Solu-Medrol  125 mg IV one-time dose given for possible allergic reaction.  6/18 diarrhea resolved, no fever.  Rash possibly due to viral infection.  Does not feel any improvement and no worsening. 6/19 patient feels improvement in the rash today, remained afebrile.  Feels stable to go home today and follow-up as an outpatient with PCP.  Patient was discharged on doxycycline  100 mg p.o. twice daily for 5 days.  Spotted fever antibody test came negative, Resulted: 07/23/23 22:35  # Hypotension due to sepsis: s/p IV fluid given for bolus Started midodrine , BP improved, no more need of midodrine  on discharge. # Hypokalemia due to gastroenteritis. Potassium repleted. # Discoid lupus:  Resume MTX next week. S/p prn narcotics for joint pain # Acute transaminitis: No RUQ symptoms, etiology  likely secondary to infection. LFTs improving.  Repeat labs in 1 week. # Elevated lipase could be secondary to sepsis. Lipase level 89--37 trended down to normal and again 57, slightly elevated without any symptoms # Diabetes mellitus type 2, mild hyperglycemia.  HbA1c 6.7, well-controlled.  Patient was not aware of being  diabetic.  Patient is not on any medication at home.  Recommended to continue diabetic diet and follow with PCP for further management as an outpatient and repeat hemoglobin A1c after 12 weeks. # Hypothyroidism:  Continue Synthroid     Body mass index is 31.09 kg/m.  Nutrition Interventions:  - Patient was instructed, not to drive, operate heavy machinery, perform activities at heights, swimming or participation in water activities or provide baby sitting services while on Pain, Sleep and Anxiety Medications; until her outpatient Physician has advised to do so again.  - Also recommended to not to take more than prescribed Pain, Sleep and Anxiety Medications.  Patient was ambulatory without any assistance. On the day of the discharge the patient's vitals were stable, and no other acute medical condition were reported by patient. the patient was felt safe to be discharge at Home.  Consultants: None Procedures: None  Discharge Exam: General: Appear in no distress, generalized maculopapular rash improving; Oral Mucosa Clear, moist. Cardiovascular: S1 and S2 Present, no Murmur, Respiratory: normal respiratory effort, Bilateral Air entry present and no Crackles, no wheezes Abdomen: Bowel Sound present, Soft and no tenderness, no hernia Extremities: no Pedal edema, no calf tenderness Neurology: alert and oriented to time, place, and person affect appropriate.  Filed Weights   07/20/23 1020  Weight: 77.1 kg   Vitals:   07/23/23 0753 07/23/23 1142  BP: 116/74 105/64  Pulse: 68   Resp: 16   Temp: 97.7 F (36.5 C)   SpO2: 95%     DISCHARGE MEDICATION: Allergies as of 07/23/2023       Reactions   Plaquenil  [hydroxychloroquine Sulfate] Rash   Lactose Intolerance (gi) Diarrhea   Penicillin G    Other Rash   Sunlight  Polymorphous light eruption    Plaquenil [hydroxychloroquine] Rash        Medication List     STOP taking these medications    prednisoLONE  acetate 1 %  ophthalmic suspension Commonly known as: PRED FORTE    traZODone  50 MG tablet Commonly known as: DESYREL        TAKE these medications    atorvastatin  20 MG tablet Commonly known as: LIPITOR Take 1 tablet (20 mg total) by mouth daily. What changed: when to take this   Carboxymethylcellulose Sodium 0.25 % Soln Place 1 drop into both eyes 4 (four) times daily as needed (dry eyes).   cetirizine  10 MG tablet Commonly known as: ZYRTEC  TAKE 1 TABLET BY MOUTH DAILY   cyclobenzaprine  5 MG tablet Commonly known as: FLEXERIL  Take 1 tablet (5 mg total) by mouth 3 (three) times daily as needed for muscle spasms.   desonide 0.05 % cream Commonly known as: DESOWEN Apply 1 Application topically 2 (two) times daily as needed (eczema).   doxycycline  100 MG tablet Commonly known as: VIBRA -TABS Take 1 tablet (100 mg total) by mouth every 12 (twelve) hours for 5 days.   DULoxetine  60 MG capsule Commonly known as: Cymbalta  Take 1 capsule (60 mg total) by mouth daily.   eletriptan  40 MG tablet Commonly known as: RELPAX  Take 40 mg by mouth as needed for migraine or headache. May repeat in 2 hours if headache persists or  recurs.   Eszopiclone  3 MG Tabs Take 1 tablet (3 mg total) by mouth at bedtime as needed. Take immediately before bedtime What changed:  when to take this additional instructions   FLUoxetine  10 MG capsule Commonly known as: PROZAC  Take 20 mg by mouth daily.   fluticasone  50 MCG/ACT nasal spray Commonly known as: FLONASE  Place 2 sprays into both nostrils daily as needed for allergies or rhinitis. fluticasone  propionate 50 mcg/actuation nasal spray,suspension   folic acid  1 MG tablet Commonly known as: FOLVITE  Take 1 mg by mouth daily.   gabapentin  100 MG capsule Commonly known as: NEURONTIN  Take 1 capsule (100 mg total) by mouth at bedtime. What changed:  when to take this reasons to take this   hydrocortisone  2.5 % cream Apply topically 2 (two) times  daily. Prn neck   ipratropium 0.06 % nasal spray Commonly known as: Atrovent  Place 2 sprays into both nostrils 4 (four) times daily. What changed:  when to take this reasons to take this   levothyroxine  125 MCG tablet Commonly known as: SYNTHROID  Take 1 tablet (125 mcg total) by mouth daily.   Magnesium Oxide 420 MG Tabs Take 420 mg by mouth at bedtime.   methotrexate 2.5 MG tablet Commonly known as: RHEUMATREX Take 15 mg by mouth once a week. Take six tablets weekly on Sundays   pantoprazole  40 MG tablet Commonly known as: PROTONIX  Take 40 mg by mouth daily.   rizatriptan  10 MG disintegrating tablet Commonly known as: MAXALT -MLT Take 1 tablet (10 mg total) by mouth as needed for migraine. May repeat in 2 hours if needed. No more than 2x per week. No more than 30 mg in 24 hrs   topiramate  25 MG tablet Commonly known as: TOPAMAX  Take 1 tablet (25 mg total) by mouth 2 (two) times daily.   triamcinolone  cream 0.1 % Commonly known as: KENALOG Apply 1 Application topically 2 (two) times daily as needed (raised scaly areas).       Allergies  Allergen Reactions   Plaquenil  [Hydroxychloroquine Sulfate] Rash   Lactose Intolerance (Gi) Diarrhea   Penicillin G    Other Rash    Sunlight  Polymorphous light eruption    Plaquenil [Hydroxychloroquine] Rash   Discharge Instructions     Call MD for:   Complete by: As directed    Rashes or joint pain   Call MD for:  difficulty breathing, headache or visual disturbances   Complete by: As directed    Call MD for:  extreme fatigue   Complete by: As directed    Call MD for:  persistant dizziness or light-headedness   Complete by: As directed    Call MD for:  persistant nausea and vomiting   Complete by: As directed    Call MD for:  redness, tenderness, or signs of infection (pain, swelling, redness, odor or green/yellow discharge around incision site)   Complete by: As directed    Call MD for:  severe uncontrolled pain    Complete by: As directed    Call MD for:  temperature >100.4   Complete by: As directed    Diet Carb Modified   Complete by: As directed    Discharge instructions   Complete by: As directed    Follow-up with PCP in 1 week, repeat CBC, BMP and LFTs in 1 week. Follow-up pending spotted fever group antibodies and vitamin D  level Advised to continue diabetic diet and follow with PCP to repeat A1c after 12 weeks  Increase activity slowly   Complete by: As directed        The results of significant diagnostics from this hospitalization (including imaging, microbiology, ancillary and laboratory) are listed below for reference.    Significant Diagnostic Studies: DG Chest 2 View Result Date: 07/20/2023 CLINICAL DATA:  Shortness of breath and fever.  Fatigue. EXAM: CHEST - 2 VIEW COMPARISON:  No comparison studies available. FINDINGS: The lungs are clear without focal pneumonia, edema, pneumothorax or pleural effusion. Linear density left mid lung is compatible with atelectasis. The cardiopericardial silhouette is within normal limits for size. No acute bony abnormality. IMPRESSION: Left mid lung atelectasis. No acute cardiopulmonary findings. Electronically Signed   By: Camellia Candle M.D.   On: 07/20/2023 10:45    Microbiology: Recent Results (from the past 240 hours)  Urine Culture     Status: Abnormal   Collection Time: 07/20/23 10:23 AM   Specimen: Urine, Clean Catch  Result Value Ref Range Status   Specimen Description   Final    URINE, CLEAN CATCH Performed at [facility], [address], [place], [place] [address]    Special Requests   Final    NONE Performed at [facility], [address] [place], [place] [address]    Culture MULTIPLE SPECIES PRESENT, SUGGEST RECOLLECTION (A)  Final   Report Status 07/21/2023 FINAL  Final  Culture, blood (routine x 2)     Status: None (Preliminary result)   Collection Time: 07/20/23  3:12 PM   Specimen: Left  Antecubital; Blood  Result Value Ref Range Status   Specimen Description LEFT ANTECUBITAL  Final   Special Requests   Final    BOTTLES DRAWN AEROBIC AND ANAEROBIC Blood Culture adequate volume   Culture   Final    NO GROWTH 2 DAYS Performed at [facility], [address] [place], [place] [address]    Report Status PENDING  Incomplete  Culture, blood (routine x 2)     Status: None (Preliminary result)   Collection Time: 07/20/23  3:12 PM   Specimen: Right Antecubital; Blood  Result Value Ref Range Status   Specimen Description RIGHT ANTECUBITAL  Final   Special Requests   Final    BOTTLES DRAWN AEROBIC AND ANAEROBIC Blood Culture adequate volume   Culture   Final    NO GROWTH 2 DAYS Performed at [facility], [address] [place], [place] [address]    Report Status PENDING  Incomplete     Labs: CBC: Recent Labs  Lab 07/20/23 1023 07/21/23 0420 07/22/23 0346 07/23/23 0328  WBC 15.3* 14.2* 12.9* 9.4  HGB 15.2* 13.0 12.4 12.2  HCT 43.4 37.4 35.1* 35.7*  MCV 87.1 88.2 88.2 89.3  PLT 175 169 212 273   Basic Metabolic Panel: Recent Labs  Lab 07/20/23 1023 07/21/23 0420 07/22/23 0346 07/23/23 0328  NA 130* 134* 133* 136  K 3.5 3.3* 3.9 4.1  CL 101 104 106 108  CO2 19* 22 21* 24  GLUCOSE 148* 129* 192* 143*  BUN 15 13 20 21   CREATININE 0.80 0.71 0.55 0.64  CALCIUM  8.2* 7.7* 8.3* 8.1*  MG  --  2.2 2.1 2.2  PHOS  --  3.1 3.4 3.2   Liver Function Tests: Recent Labs  Lab 07/20/23 1023 07/21/23 0420 07/22/23 0346 07/23/23 0328  AST 75* 51* 43* 51*  ALT 79* 60* 53* 57*  ALKPHOS 144* 119 119 101  BILITOT 1.2 1.0 0.8 0.9  PROT 7.0 6.0* 5.9* 5.5*  ALBUMIN 2.9* 2.5* 2.2* 2.2*   Recent Labs  Lab 07/20/23 1023 07/22/23 0346 07/23/23 0328  LIPASE 89* 37 57*   No results for input(s): AMMONIA in the last 168 hours. Cardiac Enzymes: No results for input(s): CKTOTAL, CKMB, CKMBINDEX, TROPONINI in the last 168 hours. BNP (last 3  results) No results for input(s): BNP in the last 8760 hours. CBG: Recent Labs  Lab 07/22/23 1157 07/22/23 1720 07/22/23 2231 07/23/23 0756 07/23/23 1119  GLUCAP 248* 124* 176* 110* 179*    Time spent: 35 minutes  Signed:  Elvan Sor  [facility] 07/23/2023 12:25 PM

## 2023-07-23 NOTE — Plan of Care (Signed)

## 2023-07-24 ENCOUNTER — Telehealth: Payer: Self-pay

## 2023-07-24 ENCOUNTER — Other Ambulatory Visit: Payer: Self-pay | Admitting: Student

## 2023-07-24 MED ORDER — VITAMIN D (ERGOCALCIFEROL) 1.25 MG (50000 UNIT) PO CAPS: 50000.0000 [IU] | ORAL_CAPSULE | ORAL | 0 refills | Status: AC

## 2023-07-24 NOTE — Transitions of Care (Post Inpatient/ED Visit) (Signed)
 07/24/2023  Name: Helen Freeman MRN: [id number] DOB: [birth year]  Today's TOC FU Call Status: Today's TOC FU Call Status:: Successful TOC FU Call Completed TOC FU Call Complete Date: 07/24/23 Patient's Name and Date of Birth confirmed.  Transition Care Management Follow-up Telephone Call Date of Discharge: 07/23/23 Discharge Facility: [facility]) Type of Discharge: Inpatient Admission Primary Inpatient Discharge Diagnosis:: Sepsis How have you been since you were released from the hospital?: Better Any questions or concerns?: No  Items Reviewed: Did you receive and understand the discharge instructions provided?: Yes Medications obtained,verified, and reconciled?: Yes (Medications Reviewed) Any new allergies since your discharge?: No Dietary orders reviewed?: Yes Type of Diet Ordered:: Low sodium Heart Healthy Do you have support at home?: Yes People in Home [RPT]: spouse Name of Support/Comfort Primary Source: Helen Freeman  Medications Reviewed Today: Medications Reviewed Today     Reviewed by Claudene Crystal, RN (Case Manager) on 07/24/23 at 1530  Med List Status: <None>   Medication Order Taking? Sig Documenting Provider Last Dose Status Informant  atorvastatin  (LIPITOR) 20 MG tablet [id number] Yes Take 1 tablet (20 mg total) by mouth daily.  Patient taking differently: Take 20 mg by mouth every evening.   McLean-Scocuzza, Karon Packer, MD  Active Other, Pharmacy Records  Carboxymethylcellulose Sodium 0.25 % SOLN [id number] Yes Place 1 drop into both eyes 4 (four) times daily as needed (dry eyes). [provider]  Active Other, Pharmacy Records           Med Note [facility], [facility] A   Mon Jul 20, 2023  4:39 PM) prn  cetirizine  (ZYRTEC ) 10 MG tablet [id number] Yes TAKE 1 TABLET BY MOUTH DAILY Helen Catching, FNP  Active Other, Pharmacy Records  cyclobenzaprine  (FLEXERIL ) 5 MG tablet [id number] Yes Take 1 tablet (5 mg total) by mouth 3  (three) times daily as needed for muscle spasms. Helen Catching, FNP  Active Other, Pharmacy Records           Med Note [facility], [facility] A   Mon Jul 20, 2023  4:59 PM) prn  desonide (DESOWEN) 0.05 % cream [id number] Yes Apply 1 Application topically 2 (two) times daily as needed (eczema). [provider]  Active Other, Pharmacy Records           Med Note [facility], [facility] A   Mon Jul 20, 2023  5:00 PM) prn  doxycycline  (VIBRA -TABS) 100 MG tablet [id number] Yes Take 1 tablet (100 mg total) by mouth every 12 (twelve) hours for 5 days. Helen Atlas, MD  Active   DULoxetine  (CYMBALTA ) 60 MG capsule [id number] Yes Take 1 capsule (60 mg total) by mouth daily. Helen Catching, FNP  Active Other, Pharmacy Records  eletriptan (RELPAX) 40 MG tablet [id number] Yes Take 40 mg by mouth as needed for migraine or headache. May repeat in 2 hours if headache persists or recurs. [provider]  Active Other, Pharmacy Records           Med Note [facility], [facility] A   Mon Jul 20, 2023  5:02 PM) prn  Eszopiclone  3 MG TABS [id number] Yes Take 1 tablet (3 mg total) by mouth at bedtime as needed. Take immediately before bedtime  Patient taking differently: Take 3 mg by mouth at bedtime. Take immediately before bedtime, lunesta    McLean-Scocuzza, Karon Packer, MD  Active Other, Pharmacy Records  FLUoxetine (PROZAC) 10 MG capsule [id number] Yes Take 20 mg by mouth daily. [provider]  Active Other, Pharmacy Records  fluticasone  (FLONASE ) 50 MCG/ACT nasal spray [id number] Yes Place 2 sprays into both nostrils daily as needed for allergies or rhinitis. fluticasone  propionate 50 mcg/actuation nasal spray,suspension McLean-Scocuzza, Karon Packer, MD  Active Other, Pharmacy Records           Med Note [facility], [facility] A   Mon Jul 20, 2023  5:04 PM) prn  folic acid  (FOLVITE ) 1 MG tablet [id number] Yes Take 1 mg by mouth daily. [provider]  Active Other, Pharmacy Records  gabapentin  (NEURONTIN ) 100 MG  capsule [id number] Yes Take 1 capsule (100 mg total) by mouth at bedtime.  Patient taking differently: Take 100 mg by mouth 3 times/day as needed-between meals & bedtime.   McLean-Scocuzza, Karon Packer, MD  Active Other, Pharmacy Records           Med Note [facility], [facility] A   Mon Jul 20, 2023  5:04 PM) prn  hydrocortisone  2.5 % cream [id number] Yes Apply topically 2 (two) times daily. Prn neck McLean-Scocuzza, Karon Packer, MD  Active Other, Pharmacy Records           Med Note [facility] A   Tue Jul 21, 2023  2:04 PM) prn  ipratropium (ATROVENT ) 0.06 % nasal spray [id number] Yes Place 2 sprays into both nostrils 4 (four) times daily.  Patient taking differently: Place 2 sprays into both nostrils as needed.   McLean-Scocuzza, Karon Packer, MD  Active Other, Pharmacy Records           Med Note [facility], [facility] A   Tue Jul 21, 2023  2:04 PM) prn  levothyroxine  (SYNTHROID ) 125 MCG tablet [id number] Yes Take 1 tablet (125 mcg total) by mouth daily. Helen Catching, FNP  Active Other, Pharmacy Records  Magnesium Oxide 420 MG TABS [id number] Yes Take 420 mg by mouth at bedtime. [provider]  Active Other, Pharmacy Records  methotrexate (RHEUMATREX) 2.5 MG tablet [id number] Yes Take 15 mg by mouth once a week. Take six tablets weekly on Sundays [provider]  Active Self  pantoprazole  (PROTONIX ) 40 MG tablet [id number] Yes Take 40 mg by mouth daily. [provider]  Active Other, Pharmacy Records  rizatriptan  (MAXALT -MLT) 10 MG disintegrating tablet [id number] Yes Take 1 tablet (10 mg total) by mouth as needed for migraine. May repeat in 2 hours if needed. No more than 2x per week. No more than 30 mg in 24 hrs McLean-Scocuzza, Karon Packer, MD  Active Other, Pharmacy Records           Med Note [facility], [facility] A   Mon Jul 20, 2023  5:08 PM) prn  topiramate  (TOPAMAX ) 25 MG tablet [id number] Yes Take 1 tablet (25 mg total) by mouth 2 (two) times daily. McLean-Scocuzza, Karon Packer, MD  Active Other,  Pharmacy Records  triamcinolone  cream (KENALOG) 0.1 % [id number] Yes Apply 1 Application topically 2 (two) times daily as needed (raised scaly areas). [provider]  Active Other, Pharmacy Records           Med Note [facility], [facility] A   Mon Jul 20, 2023  5:09 PM) prn  Med List Note Danella Dunn, Leory Rands, CPhT 07/20/23 1636): [facility]             Home Care and Equipment/Supplies: Were Home Health Services Ordered?: NA Any new equipment or medical supplies ordered?: NA  Functional Questionnaire: Do you need assistance with bathing/showering or dressing?: No Do you need assistance with meal preparation?: No Do you need assistance with eating?: No Do  you have difficulty maintaining continence: No Do you need assistance with getting out of bed/getting out of a chair/moving?: No Do you have difficulty managing or taking your medications?: No  Follow up appointments reviewed: PCP Follow-up appointment confirmed?: Yes Date of PCP follow-up appointment?: 07/31/23 Follow-up Provider: Maybell Spates Follow-up appointment confirmed?: NA Do you need transportation to your follow-up appointment?: No Do you understand care options if your condition(s) worsen?: Yes-patient verbalized understanding  SDOH Interventions Today    Flowsheet Row Most Recent Value  SDOH Interventions   Food Insecurity Interventions Intervention Not Indicated  Housing Interventions Intervention Not Indicated  Transportation Interventions Intervention Not Indicated  Utilities Interventions Intervention Not Indicated    Gareld June, BSN, RN [place]  [facility] - [facility] RN Care Manager [phone]

## 2023-07-25 LAB — CULTURE, BLOOD (ROUTINE X 2)
Culture: NO GROWTH
Culture: NO GROWTH
Special Requests: ADEQUATE
Special Requests: ADEQUATE

## 2023-07-31 ENCOUNTER — Ambulatory Visit: Payer: Self-pay | Admitting: Family

## 2023-07-31 ENCOUNTER — Ambulatory Visit: Admitting: Family

## 2023-07-31 ENCOUNTER — Encounter: Payer: Self-pay | Admitting: Family

## 2023-07-31 VITALS — BP 126/72 | HR 76 | Temp 98.3°F | Ht 62.0 in | Wt 171.2 lb

## 2023-07-31 DIAGNOSIS — R21 Rash and other nonspecific skin eruption: Secondary | ICD-10-CM

## 2023-07-31 LAB — COMPREHENSIVE METABOLIC PANEL WITH GFR
ALT: 61 U/L — ABNORMAL HIGH (ref 0–35)
AST: 35 U/L (ref 0–37)
Albumin: 3.5 g/dL (ref 3.5–5.2)
Alkaline Phosphatase: 115 U/L (ref 39–117)
BUN: 11 mg/dL (ref 6–23)
CO2: 27 meq/L (ref 19–32)
Calcium: 9 mg/dL (ref 8.4–10.5)
Chloride: 105 meq/L (ref 96–112)
Creatinine, Ser: 0.72 mg/dL (ref 0.40–1.20)
GFR: 89.66 mL/min (ref >60.00)
Glucose, Bld: 106 mg/dL — ABNORMAL HIGH (ref 70–99)
Potassium: 4.1 meq/L (ref 3.5–5.1)
Sodium: 138 meq/L (ref 135–145)
Total Bilirubin: 0.7 mg/dL (ref 0.2–1.2)
Total Protein: 7.5 g/dL (ref 6.0–8.3)

## 2023-07-31 LAB — CBC WITH DIFFERENTIAL/PLATELET
Basophils Absolute: 0 10*3/uL (ref 0.0–0.1)
Basophils Relative: 0.5 % (ref 0.0–3.0)
Eosinophils Absolute: 0 10*3/uL (ref 0.0–0.7)
Eosinophils Relative: 0.4 % (ref 0.0–5.0)
HCT: 41.4 % (ref 36.0–46.0)
Hemoglobin: 13.8 g/dL (ref 12.0–15.0)
Lymphocytes Relative: 27 % (ref 12.0–46.0)
Lymphs Abs: 2.1 10*3/uL (ref 0.7–4.0)
MCHC: 33.4 g/dL (ref 30.0–36.0)
MCV: 93.4 fl (ref 78.0–100.0)
Monocytes Absolute: 0.6 10*3/uL (ref 0.1–1.0)
Monocytes Relative: 8 % (ref 3.0–12.0)
Neutro Abs: 5 10*3/uL (ref 1.4–7.7)
Neutrophils Relative %: 64.1 % (ref 43.0–77.0)
Platelets: 342 10*3/uL (ref 150.0–400.0)
RBC: 4.44 Mil/uL (ref 3.87–5.11)
RDW: 16.3 % — ABNORMAL HIGH (ref 11.5–15.5)
WBC: 7.8 10*3/uL (ref 4.0–10.5)

## 2023-07-31 NOTE — Progress Notes (Unsigned)
 Assessment & Plan:  There are no diagnoses linked to this encounter.   Return precautions given.   Risks, benefits, and alternatives of the medications and treatment plan prescribed today were discussed, and patient expressed understanding.   Education regarding symptom management and diagnosis given to patient on AVS either electronically or printed.  No follow-ups on file.  Rollene Northern, FNP  Subjective:    Patient ID: Helen Freeman, female    DOB: 09/26/1960, 63 y.o.   MRN: [id number]  CC: Glorya Bartley is a 63 y.o. female who presents today for follow up.   HPI: Diarrhea, fever, vomiting has resolved.  Rash is resolving; still visible on legs, abdomen and low back.   No sick contacts. Nor does she remember a tick bite [place] the week before.   Not sexually active.   No IV drug use. No alcohol .   She completed childhood vaccinations.      Completed 5 day course of doxycycline  100mg  BID   Seen for viral UC 07/17/23 for diarrhea ,fever, chills.  Covid, strep negative. Conservative management  Presented to [facility] 07/20/23 for rash, fever and then [facility] ED  Admitted 07/20/23, Discharged 07/23/23 Admitted sepsis , macular rash Given vancomycin  cefepime  and Flagyl  and IV bolus 1000 mL in the ED  Consulted with Dr Epifanio - rec send out RMSF serology , start doxycycline  100mg  BID Diarrhea resolved ( GI pathogen panel not sent)     Spotted fever antibody test came negative, Resulted: 07/23/23 22:35  Lipase trended down to 57 AST 51 ALT 57 Blood culture negative WBC 12.9 ( prior 15.9)  CXR no acute findings  H/o SLE - compliant with methotrexate  A1c 6.7  Allergies: Plaquenil  [hydroxychloroquine sulfate], Lactose intolerance (gi), Penicillin g, Other, and Plaquenil [hydroxychloroquine] Current Outpatient Medications on File Prior to Visit  Medication Sig Dispense Refill   atorvastatin  (LIPITOR) 20 MG tablet Take 1 tablet (20 mg total) by mouth  daily. (Patient taking differently: Take 20 mg by mouth every evening.) 90 tablet 3   Carboxymethylcellulose Sodium 0.25 % SOLN Place 1 drop into both eyes 4 (four) times daily as needed (dry eyes).     cetirizine  (ZYRTEC ) 10 MG tablet TAKE 1 TABLET BY MOUTH DAILY 90 tablet 1   cyclobenzaprine  (FLEXERIL ) 5 MG tablet Take 1 tablet (5 mg total) by mouth 3 (three) times daily as needed for muscle spasms. 90 tablet 5   desonide (DESOWEN) 0.05 % cream Apply 1 Application topically 2 (two) times daily as needed (eczema).     DULoxetine  (CYMBALTA ) 60 MG capsule Take 1 capsule (60 mg total) by mouth daily. 90 capsule 1   Eszopiclone  3 MG TABS Take 1 tablet (3 mg total) by mouth at bedtime as needed. Take immediately before bedtime (Patient taking differently: Take 3 mg by mouth at bedtime. Take immediately before bedtime, lunesta ) 90 tablet 1   FLUoxetine  (PROZAC ) 10 MG capsule Take 20 mg by mouth daily.     fluticasone  (FLONASE ) 50 MCG/ACT nasal spray Place 2 sprays into both nostrils daily as needed for allergies or rhinitis. fluticasone  propionate 50 mcg/actuation nasal spray,suspension 16 g 11   folic acid  (FOLVITE ) 1 MG tablet Take 1 mg by mouth daily.     gabapentin  (NEURONTIN ) 100 MG capsule Take 1 capsule (100 mg total) by mouth at bedtime. (Patient taking differently: Take 100 mg by mouth 3 times/day as needed-between meals & bedtime.) 90 capsule 3   hydrocortisone  2.5 % cream Apply topically 2 (  two) times daily. Prn neck 60 g 0   ipratropium (ATROVENT ) 0.06 % nasal spray Place 2 sprays into both nostrils 4 (four) times daily. (Patient taking differently: Place 2 sprays into both nostrils as needed.) 15 mL 13   levothyroxine  (SYNTHROID ) 125 MCG tablet Take 1 tablet (125 mcg total) by mouth daily. 90 tablet 3   Magnesium Oxide 420 MG TABS Take 420 mg by mouth at bedtime.     methotrexate (RHEUMATREX) 2.5 MG tablet Take 15 mg by mouth once a week. Take six tablets weekly on Sundays     pantoprazole   (PROTONIX ) 40 MG tablet Take 40 mg by mouth daily.     rizatriptan  (MAXALT -MLT) 10 MG disintegrating tablet Take 1 tablet (10 mg total) by mouth as needed for migraine. May repeat in 2 hours if needed. No more than 2x per week. No more than 30 mg in 24 hrs 10 tablet 11   topiramate  (TOPAMAX ) 25 MG tablet Take 1 tablet (25 mg total) by mouth 2 (two) times daily. 180 tablet 3   triamcinolone  cream (KENALOG) 0.1 % Apply 1 Application topically 2 (two) times daily as needed (raised scaly areas).     Vitamin D , Ergocalciferol , (DRISDOL ) 1.25 MG (50000 UNIT) CAPS capsule Take 1 capsule (50,000 Units total) by mouth every 7 (seven) days. 12 capsule 0   eletriptan  (RELPAX ) 40 MG tablet Take 40 mg by mouth as needed for migraine or headache. May repeat in 2 hours if headache persists or recurs. (Patient not taking: Reported on 07/31/2023)     No current facility-administered medications on file prior to visit.    Review of Systems    Objective:    BP 126/72   Pulse 76   Temp 98.3 F (36.8 C) (Oral)   Ht 5' 2 (1.575 m)   Wt 171 lb 3.2 oz (77.7 kg)   SpO2 97%   BMI 31.31 kg/m  BP Readings from Last 3 Encounters:  07/31/23 126/72  07/23/23 105/64  07/17/23 116/71   Wt Readings from Last 3 Encounters:  07/31/23 171 lb 3.2 oz (77.7 kg)  07/20/23 170 lb (77.1 kg)  09/02/22 181 lb (82.1 kg)        Physical Exam Vitals reviewed.  Constitutional:      Appearance: She is well-developed.   Eyes:     Conjunctiva/sclera: Conjunctivae normal.    Cardiovascular:     Rate and Rhythm: Normal rate and regular rhythm.     Pulses: Normal pulses.     Heart sounds: Normal heart sounds.  Pulmonary:     Effort: Pulmonary effort is normal.     Breath sounds: Normal breath sounds. No wheezing, rhonchi or rales.   Skin:    General: Skin is warm and dry.   Neurological:     Mental Status: She is alert.   Psychiatric:        Speech: Speech normal.        Behavior: Behavior normal.         Thought Content: Thought content normal.

## 2023-07-31 NOTE — Patient Instructions (Signed)
 Vitamin D  is slightly low. Please start cholecalciferol 800 units daily. You may find this over the counter in the drug store.   Please call to arrange follow up with me a few months and request vitamin d  to be drawn then.   Please ensure you are following a diet high in calcium  -- research shows better outcomes with dietary sources including kale, yogurt, broccolii, cheese, okra, almonds- to name a few.    Please return in 4 weeks for repeat [facility] spotted fever antibody lab.

## 2023-07-31 NOTE — Assessment & Plan Note (Addendum)
 Reviewed hospitalization.  No systemic features.  Spotted fever antibody test came negative, Resulted: 07/23/23 22:35  She is not sexually active. No ticks. Travel to [facility].  Strep POC negative. Neg HIV screen.  Consulted via secure chat with Dr Epifanio regarding alternative diagsnosis. He advised that would need 4 weeks convalescent test to see if turns RMSF positive. If all labs good and no further systemic symptoms I would monitor the rash and if persist after another 1-2 weeks or worsens could see dermatology.  She is established with the [facility] dermatology.

## 2023-08-17 ENCOUNTER — Ambulatory Visit: Payer: Self-pay | Admitting: Family

## 2023-08-17 DIAGNOSIS — R899 Unspecified abnormal finding in specimens from other organs, systems and tissues: Secondary | ICD-10-CM

## 2023-08-26 ENCOUNTER — Other Ambulatory Visit: Payer: Self-pay | Admitting: Family

## 2023-08-26 MED ORDER — FOLIC ACID 1 MG PO TABS: 1.0000 mg | ORAL_TABLET | Freq: Every day | ORAL | 3 refills | Status: AC

## 2023-08-26 MED ORDER — PANTOPRAZOLE SODIUM 40 MG PO TBEC: 40.0000 mg | DELAYED_RELEASE_TABLET | Freq: Every day | ORAL | 3 refills | Status: AC

## 2023-08-28 ENCOUNTER — Other Ambulatory Visit (INDEPENDENT_AMBULATORY_CARE_PROVIDER_SITE_OTHER)

## 2023-08-28 DIAGNOSIS — R899 Unspecified abnormal finding in specimens from other organs, systems and tissues: Secondary | ICD-10-CM | POA: Diagnosis not present

## 2023-08-28 DIAGNOSIS — R21 Rash and other nonspecific skin eruption: Secondary | ICD-10-CM

## 2023-08-30 ENCOUNTER — Ambulatory Visit: Payer: Self-pay | Admitting: Family

## 2023-09-02 LAB — HEPATIC FUNCTION PANEL
AG Ratio: 1.4 (calc) (ref 1.0–2.5)
ALT: 28 U/L (ref 6–29)
AST: 20 U/L (ref 10–35)
Albumin: 3.8 g/dL (ref 3.6–5.1)
Alkaline phosphatase (APISO): 108 U/L (ref 37–153)
Bilirubin, Direct: 0.1 mg/dL (ref 0.0–0.2)
Globulin: 2.8 g/dL (ref 1.9–3.7)
Indirect Bilirubin: 0.3 mg/dL (ref 0.2–1.2)
Total Bilirubin: 0.4 mg/dL (ref 0.2–1.2)
Total Protein: 6.6 g/dL (ref 6.1–8.1)

## 2023-09-02 LAB — ROCKY MTN SPOTTED FVR ABS PNL(IGG+IGM)
RMSF IgG: DETECTED — AB
RMSF IgM: NOT DETECTED

## 2023-09-02 LAB — REFLEX RMSF IGG TITER: RMSF IgG Titer: 1:512 {titer} — ABNORMAL HIGH

## 2023-09-11 ENCOUNTER — Other Ambulatory Visit: Payer: Self-pay | Admitting: Family

## 2023-09-11 MED ORDER — TOPIRAMATE 25 MG PO TABS: 25.0000 mg | ORAL_TABLET | Freq: Two times a day (BID) | ORAL | 3 refills | Status: AC

## 2023-10-22 ENCOUNTER — Encounter: Payer: Self-pay | Admitting: Family

## 2023-10-22 ENCOUNTER — Other Ambulatory Visit: Payer: Self-pay | Admitting: Family

## 2023-10-22 DIAGNOSIS — E559 Vitamin D deficiency, unspecified: Secondary | ICD-10-CM

## 2023-11-12 ENCOUNTER — Other Ambulatory Visit: Payer: Self-pay | Admitting: Physician Assistant

## 2023-11-12 DIAGNOSIS — Z87891 Personal history of nicotine dependence: Secondary | ICD-10-CM

## 2023-11-20 ENCOUNTER — Ambulatory Visit
Admission: RE | Admit: 2023-11-20 | Discharge: 2023-11-20 | Disposition: A | Discharge status: Home / Self Care | Source: Ambulatory Visit | Attending: Physician Assistant | Admitting: Physician Assistant

## 2023-11-20 DIAGNOSIS — Z87891 Personal history of nicotine dependence: Secondary | ICD-10-CM | POA: Diagnosis present

## 2024-01-28 ENCOUNTER — Other Ambulatory Visit: Payer: Self-pay | Admitting: Family

## 2024-01-28 DIAGNOSIS — F419 Anxiety disorder, unspecified: Secondary | ICD-10-CM

## 2024-02-09 ENCOUNTER — Encounter (INDEPENDENT_AMBULATORY_CARE_PROVIDER_SITE_OTHER): Payer: Self-pay

## 2024-03-07 ENCOUNTER — Ambulatory Visit: Admitting: Family

## 2024-03-09 ENCOUNTER — Ambulatory Visit (INDEPENDENT_AMBULATORY_CARE_PROVIDER_SITE_OTHER): Admitting: Nurse Practitioner

## 2024-03-09 ENCOUNTER — Encounter (INDEPENDENT_AMBULATORY_CARE_PROVIDER_SITE_OTHER): Payer: Self-pay | Admitting: Nurse Practitioner

## 2024-03-09 VITALS — BP 112/80 | HR 85 | Temp 98.3°F | Ht 61.0 in | Wt 176.0 lb

## 2024-03-09 DIAGNOSIS — Z6833 Body mass index (BMI) 33.0-33.9, adult: Secondary | ICD-10-CM

## 2024-03-09 DIAGNOSIS — E785 Hyperlipidemia, unspecified: Secondary | ICD-10-CM | POA: Diagnosis not present

## 2024-03-09 DIAGNOSIS — E039 Hypothyroidism, unspecified: Secondary | ICD-10-CM | POA: Diagnosis not present

## 2024-03-09 DIAGNOSIS — Z7985 Long-term (current) use of injectable non-insulin antidiabetic drugs: Secondary | ICD-10-CM

## 2024-03-09 DIAGNOSIS — G4733 Obstructive sleep apnea (adult) (pediatric): Secondary | ICD-10-CM | POA: Diagnosis not present

## 2024-03-09 DIAGNOSIS — E1169 Type 2 diabetes mellitus with other specified complication: Secondary | ICD-10-CM | POA: Diagnosis not present

## 2024-03-09 DIAGNOSIS — E559 Vitamin D deficiency, unspecified: Secondary | ICD-10-CM | POA: Diagnosis not present

## 2024-03-09 DIAGNOSIS — E66811 Obesity, class 1: Secondary | ICD-10-CM

## 2024-03-09 DIAGNOSIS — L930 Discoid lupus erythematosus: Secondary | ICD-10-CM | POA: Diagnosis not present

## 2024-03-09 DIAGNOSIS — E669 Obesity, unspecified: Secondary | ICD-10-CM

## 2024-03-09 NOTE — Progress Notes (Signed)
 " [address] [place], [place], [place] [address] Office: [phone]  /  Fax: [phone]   Initial Consultation    Helen Freeman was seen in clinic today to evaluate for obesity. She is interested in losing weight to improve overall health and reduce the risk of weight related complications. She presents today to review program treatment options, initial physical assessment, and evaluation.    Anthropometrics and Bioimpedance Analysis   Body mass index is 33.25 kg/m. Body Fat Mass : 43.1 % Visceral Fat Mass Rating : 64  Helen Freeman had a previous laparoscopic sleeve gastrectomy with hiatal hernia repair Jun 16, 2016 [facility]-Dr. Wolm; laparoscopic redo     hiatal hernia repair with gastropexy for reflux Dr Wolm 05/11/2020 . Highest weight was 234 and lowest weight is 165. She was recently evaluated by Dr. Tanda who increased her tirzepatide to 5 mg 03/09/24- encourage to take multivitamin supplementation  through [place] as she was not taking a bariatric vitamin previously She does have discoid lupus and history of [place] spotted fever and takes Methotrexate(15 mg q 7 days) and folic acid .  Also has chronic pain managed with Gabapentin  and Percocet. Fairly controlled pain in her back, hips and neck.  She does have sleep apnea treated with dental appliance, did not tolerate CPAP.  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   She does have Type 2 diabetes and is currently on Tirzepatide 5 mg once a week. Lab Results  Component Value Date   HGBA1C 6.7 (H) 07/20/2023    She currently takes atorvastatin  20 mg every day for hyperlipidemia- denies side effects Lab Results  Component Value Date   CHOL 154 06/17/2022   HDL 54.90 06/17/2022   LDLCALC 81 06/17/2022   TRIG 94.0 06/17/2022   CHOLHDL 3 06/17/2022    She has hypothyroidism and is currently on levothyroxine  125 mcg daily Lab Results  Component Value Date   TSH 0.603 11/28/2022   FREET4 1.24  04/09/2021     Obesity Related Diseases and Complications  Obesity Quality of Life and Psychosocial Complications: Reduced health-related quality of life and Decrease physical activity and social participation  Cardiometabolic: Type 2 diabetes, Dyslipidemia or hypercholesterolemia, MASLD or MASH, Chronic kidney disease or obesity glomerulopathy, DOE, and Fatigue  Biomechanical: Osteoarthritis of the knee or hip, Low back pain, Obstructive sleep apnea, and GERD   Weight Related History  She was referred by: Specialist  When asked what they would like to accomplish? She states: Adopt a healthier eating pattern and lifestyle, Improve energy levels and physical activity, Improve existing medical conditions, Improve quality of life, Improve appearance, Improve self-confidence, and Lose 40 lbs  Weight history: She first noted weight gain after birth of son at age 78, did not return to prepregnancy weight. She was in [employer] for 24 years and discharged 2006 and then noticed more weight gain.   Highest weight: 234  Contributing factors: family history of obesity, disruption of circadian rhythm / sleep disordered breathing, use of obesogenic medications: Psychotropic medications, Antiepileptics, and Other: pain medication, moderate to high levels of stress, reduced physical activity, chronic skipping of meals, menopause, strong orexigenic signaling and/or inadequate inhibitory control , need for convenience due to lack of time, history of metabolic surgery, multiple weight loss attempts in the past, and hectic pace of life  Prior weight loss attempts: Noom  Current or previous pharmacotherapy: Metformin , Phentermine, GLP-1 + GIP, and Topiramate   Response to medication: currently started  Zepbound  Current nutrition plan: None  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  Current level of physical activity: None  Barriers to Exercise: orthopedic problems and chronic  pain  Readiness and Motivation  On a scale from 0 to 10 How ready are you to make changes to your eating and physical activity to lose weight? 10 How important is it for you to lose weight right now ? 10 How confident are you that you can lose weight if you try? 10  Past Medical History   Past Medical History:  Diagnosis Date   Anxiety    Arthritis    Chronic gastritis    egd 04/18/16 + chronic gastritis and EGD 06/16/16 neg H pylori    Depression    Discoid lupus    causes scaly plaques on scalp face and ears   Eczema    Fibroids    Fibromyalgia    Gallstones    11/25/17 US    Hashimoto's disease    History of kidney stones    Hyperlipemia    Hypothyroidism    Lateral epicondylitis    tennis elbow   Migraines    Morphea    localized scleroderma   Plantar fasciitis    PLE (polymorphic light eruption)    sunlight  exposure in spring or summer causes skin rash   Prediabetes    Right lateral epicondylitis    Sleep apnea    Urine incontinence      Objective    BP 112/80   Pulse 85   Temp 98.3 F (36.8 C)   Ht 5' 1 (1.549 m)   Wt 176 lb (79.8 kg)   SpO2 100%   BMI 33.25 kg/m  She was weighed on the bioimpedance scale: Body mass index is 33.25 kg/m.    General:  Alert, oriented and cooperative. Patient is in no acute distress.  Respiratory: Normal respiratory effort, no problems with respiration noted   Gait: able to ambulate independently  Mental Status: Normal mood and affect. Normal behavior. Normal judgment and thought content.   Diagnostic Data Reviewed  BMET    Component Value Date/Time   NA 138 07/31/2023 1255   NA 146 (H) 09/29/2019 0842   K 4.1 07/31/2023 1255   CL 105 07/31/2023 1255   CO2 27 07/31/2023 1255   GLUCOSE 106 (H) 07/31/2023 1255   BUN 11 07/31/2023 1255   BUN 18 09/29/2019 0842   CREATININE 0.72 07/31/2023 1255   CALCIUM  9.0 07/31/2023 1255   GFRNONAA >60 07/23/2023 0328   GFRAA 87 09/29/2019 0842   Lab Results   Component Value Date   HGBA1C 6.7 (H) 07/20/2023   HGBA1C 5.9 (H) 12/25/2017   No results found for: INSULIN  CBC    Component Value Date/Time   WBC 7.8 07/31/2023 1255   RBC 4.44 07/31/2023 1255   HGB 13.8 07/31/2023 1255   HGB 13.6 09/29/2019 0842   HCT 41.4 07/31/2023 1255   HCT 40.1 09/29/2019 0842   PLT 342.0 07/31/2023 1255   PLT 228 09/29/2019 0842   MCV 93.4 07/31/2023 1255   MCV 88 09/29/2019 0842   MCH 30.5 07/23/2023 0328   MCHC 33.4 07/31/2023 1255   RDW 16.3 (H) 07/31/2023 1255   RDW 13.6 09/29/2019 0842   Iron/TIBC/Ferritin/ %Sat    Component Value Date/Time   IRON 91 10/11/2021 0837   IRON 79 09/29/2019 0842   TIBC 308.0 10/11/2021 0837   TIBC 238 (L) 09/29/2019 0842   FERRITIN 52.8  10/11/2021 0837   FERRITIN 139 09/29/2019 0842   IRONPCTSAT 29.5 10/11/2021 0837   IRONPCTSAT 33 09/29/2019 0842   Lipid Panel     Component Value Date/Time   CHOL 154 06/17/2022 0953   CHOL 181 09/29/2019 0842   TRIG 94.0 06/17/2022 0953   HDL 54.90 06/17/2022 0953   HDL 60 09/29/2019 0842   CHOLHDL 3 06/17/2022 0953   VLDL 18.8 06/17/2022 0953   LDLCALC 81 06/17/2022 0953   LDLCALC 103 (H) 09/29/2019 0842   Hepatic Function Panel     Component Value Date/Time   PROT 6.6 08/28/2023 1404   PROT 6.8 09/29/2019 0842   ALBUMIN 3.5 07/31/2023 1255   ALBUMIN 4.1 09/29/2019 0842   AST 20 08/28/2023 1404   ALT 28 08/28/2023 1404   ALKPHOS 115 07/31/2023 1255   BILITOT 0.4 08/28/2023 1404   BILITOT 0.4 09/29/2019 0842   BILIDIR 0.1 08/28/2023 1404   IBILI 0.3 08/28/2023 1404      Component Value Date/Time   TSH 0.603 11/28/2022 1502    Medications  Outpatient Encounter Medications as of 03/09/2024  Medication Sig Note   atorvastatin  (LIPITOR) 20 MG tablet Take 1 tablet (20 mg total) by mouth daily. (Patient taking differently: Take 20 mg by mouth every evening.)    Carboxymethylcellulose Sodium 0.25 % SOLN Place 1 drop into both eyes 4 (four) times daily as  needed (dry eyes). 07/20/2023: prn   cetirizine  (ZYRTEC ) 10 MG tablet TAKE 1 TABLET BY MOUTH DAILY    cyclobenzaprine  (FLEXERIL ) 5 MG tablet Take 1 tablet (5 mg total) by mouth 3 (three) times daily as needed for muscle spasms. 07/20/2023: prn   desonide (DESOWEN) 0.05 % cream Apply 1 Application topically 2 (two) times daily as needed (eczema). 07/20/2023: prn   DULoxetine  (CYMBALTA ) 60 MG capsule TAKE ONE CAPSULE BY MOUTH ONE TIME DAILY    Eszopiclone  3 MG TABS Take 1 tablet (3 mg total) by mouth at bedtime as needed. Take immediately before bedtime (Patient taking differently: Take 3 mg by mouth at bedtime. Take immediately before bedtime, lunesta )    FLUoxetine  (PROZAC ) 10 MG capsule Take 20 mg by mouth daily.    fluticasone  (FLONASE ) 50 MCG/ACT nasal spray Place 2 sprays into both nostrils daily as needed for allergies or rhinitis. fluticasone  propionate 50 mcg/actuation nasal spray,suspension 07/20/2023: prn   folic acid  (FOLVITE ) 1 MG tablet Take 1 tablet (1 mg total) by mouth daily.    gabapentin  (NEURONTIN ) 100 MG capsule Take 1 capsule (100 mg total) by mouth at bedtime. (Patient taking differently: Take 100 mg by mouth 3 times/day as needed-between meals & bedtime.) 07/20/2023: prn   hydrocortisone  2.5 % cream Apply topically 2 (two) times daily. Prn neck 07/21/2023: prn   ipratropium (ATROVENT ) 0.06 % nasal spray Place 2 sprays into both nostrils 4 (four) times daily. (Patient taking differently: Place 2 sprays into both nostrils as needed.) 07/21/2023: prn   levothyroxine  (SYNTHROID ) 125 MCG tablet Take 1 tablet (125 mcg total) by mouth daily.    Magnesium Oxide 420 MG TABS Take 420 mg by mouth at bedtime.    methotrexate (RHEUMATREX) 2.5 MG tablet Take 15 mg by mouth once a week. Take six tablets weekly on Sundays    pantoprazole  (PROTONIX ) 40 MG tablet Take 1 tablet (40 mg total) by mouth daily.    rizatriptan  (MAXALT -MLT) 10 MG disintegrating tablet Take 1 tablet (10 mg total) by mouth as  needed for migraine. May repeat in 2 hours if needed. No  more than 2x per week. No more than 30 mg in 24 hrs 07/20/2023: prn   tirzepatide (ZEPBOUND) 5 MG/0.5ML Pen Inject 5 mg into the skin once a week.    topiramate  (TOPAMAX ) 25 MG tablet Take 1 tablet (25 mg total) by mouth 2 (two) times daily.    triamcinolone  cream (KENALOG) 0.1 % Apply 1 Application topically 2 (two) times daily as needed (raised scaly areas). 07/20/2023: prn   [DISCONTINUED] eletriptan  (RELPAX ) 40 MG tablet Take 40 mg by mouth as needed for migraine or headache. May repeat in 2 hours if headache persists or recurs. (Patient not taking: Reported on 03/09/2024) 07/20/2023: prn   No facility-administered encounter medications on file as of 03/09/2024.     Assessment and Plan   Hypothyroidism, unspecified type       Continue Levothyroxine  125 mcg daily       Continue to follow regularly with PCP  Hyperlipidemia associated with type 2 diabetes mellitus (HCC) Limit saturated fats Loss of 10-15% body weight can improve lipid levels Continue Atorvastatin  20 mg every day and monitor for side effects Continue to follow regularly with PCP  Type 2 diabetes mellitus in patient with obesity (HCC) Limit simple carbohydrates Decreasing body weight by 10-15% can improve glucose levels Continue Tirzepatide 5 mg SQ QW, monitor for side effects Continue to follow regularly with PCP and bariatrics   Vitamin D  deficiency Vitamin d  supplementation has been shown to decrease fatigue, decrease risk of progression to insulin  resistance and then prediabetes, decreases risk of falling in older age and can even assist in decreasing depressive symptoms in PTSD   Check Vit D at first visit.   Obstructive sleep apnea syndrome       Use dental appliance 100% of the time       Decreasing body weight by 10-15% can improve AHI   Discoid Lupus       Continue Methotrexate 15 mg once a week       Continue pain medication as needed       Continue to  follow with PCP regularly  Class 1 obesity with serious comorbidity and body mass index (BMI) of 33.0 to 33.9 in adult, unspecified obesity type Obesity Treatment and Action Plan:  Patient will work on garnering support from family and friends to begin weight loss journey. Will work on eliminating or reducing the presence of highly palatable, calorie dense foods in the home. Will complete provided nutritional and psychosocial assessment questionnaire before the next appointment. Will be scheduled for indirect calorimetry to determine resting energy expenditure in a fasting state.  This will allow us  to create a reduced calorie, high-protein meal plan to promote loss of fat mass while preserving muscle mass. Counseled on the health benefits of losing 5%-15% of total body weight. Was counseled on nutritional approaches to weight loss and benefits of reducing processed foods and consuming plant-based foods and high quality protein as part of nutritional weight management. Was counseled on pharmacotherapy and role as an adjunct in weight management.   Education and Additional resources  She was weighed on the bioimpedance scale and results were discussed and documented in the synopsis.  We discussed obesity as a progressive, chronic disease and the importance of a more detailed evaluation of all the factors contributing to the disease.  We reviewed the basic principles in obesity management.   We discussed the importance of long term lifestyle changes which include nutrition, exercise and behavioral modification as well as the importance of  customizing this to her specific health and social needs.  We reviewed the role of medical interventions including pharmacotherapy and surgical interventions.   We discussed the benefits of reaching a healthier weight to alleviate the symptoms of existing conditions and reduce the risks of the biomechanical, cardiometabolic and psychological effects of  obesity.  We reviewed our program approach and philosophy, which are guided by the four pillars of obesity medicine.  We discussed how to prepare for intake appointment and the importance of fasting and avoidance of stimulants for at least 8 hours prior to indirect calorimetry.  Helen Freeman appears to be in the action stage of change and reports being ready to initiate intensive lifestyle and behavioral modifications as part of their weight loss journey.  Attestation  Reviewed by clinician on day of visit: allergies, medications, problem list, medical history, surgical history, family history, social history, and previous encounter notes pertinent to obesity diagnosis.  I personally spent a total of 45 minutes in the care of the patient today including preparing to see the patient, getting/reviewing separately obtained history, performing a medically appropriate exam/evaluation, counseling and educating, and documenting clinical information in the EHR.   Lonell Liverpool ANP-C "

## 2024-03-29 ENCOUNTER — Ambulatory Visit: Admitting: Family
